# Patient Record
Sex: Female | Born: 1970
Health system: Southern US, Community
[De-identification: ages and names within clinical notes are randomized; demographics above are authoritative.]

## PROBLEM LIST (undated history)

## (undated) DIAGNOSIS — I10 Essential (primary) hypertension: Secondary | ICD-10-CM

## (undated) DIAGNOSIS — K5790 Diverticulosis of intestine, part unspecified, without perforation or abscess without bleeding: Secondary | ICD-10-CM

## (undated) DIAGNOSIS — N200 Calculus of kidney: Secondary | ICD-10-CM

## (undated) DIAGNOSIS — E119 Type 2 diabetes mellitus without complications: Secondary | ICD-10-CM

## (undated) HISTORY — DX: Essential (primary) hypertension: I10

## (undated) HISTORY — DX: Type 2 diabetes mellitus without complications: E11.9

## (undated) HISTORY — PX: ABDOMINAL HYSTERECTOMY: SHX81

---

## 2000-06-06 ENCOUNTER — Encounter: Payer: Self-pay | Admitting: Internal Medicine

## 2000-06-06 ENCOUNTER — Ambulatory Visit (HOSPITAL_COMMUNITY): Admission: RE | Admit: 2000-06-06 | Discharge: 2000-06-06 | Payer: Self-pay | Admitting: Internal Medicine

## 2000-08-17 ENCOUNTER — Emergency Department (HOSPITAL_COMMUNITY): Admission: EM | Admit: 2000-08-17 | Discharge: 2000-08-17 | Payer: Self-pay | Admitting: Emergency Medicine

## 2000-09-28 ENCOUNTER — Other Ambulatory Visit: Admission: RE | Admit: 2000-09-28 | Discharge: 2000-09-28 | Payer: Self-pay | Admitting: Urology

## 2000-10-03 ENCOUNTER — Encounter: Payer: Self-pay | Admitting: Urology

## 2000-10-03 ENCOUNTER — Ambulatory Visit (HOSPITAL_COMMUNITY): Admission: RE | Admit: 2000-10-03 | Discharge: 2000-10-03 | Payer: Self-pay | Admitting: Urology

## 2001-03-28 ENCOUNTER — Encounter: Payer: Self-pay | Admitting: *Deleted

## 2001-03-28 ENCOUNTER — Ambulatory Visit (HOSPITAL_COMMUNITY): Admission: RE | Admit: 2001-03-28 | Discharge: 2001-03-28 | Payer: Self-pay | Admitting: *Deleted

## 2001-07-31 ENCOUNTER — Inpatient Hospital Stay (HOSPITAL_COMMUNITY): Admission: RE | Admit: 2001-07-31 | Discharge: 2001-08-02 | Payer: Self-pay | Admitting: *Deleted

## 2001-11-04 ENCOUNTER — Other Ambulatory Visit: Admission: RE | Admit: 2001-11-04 | Discharge: 2001-11-04 | Payer: Self-pay | Admitting: Dermatology

## 2002-06-05 ENCOUNTER — Emergency Department (HOSPITAL_COMMUNITY): Admission: EM | Admit: 2002-06-05 | Discharge: 2002-06-05 | Payer: Self-pay | Admitting: Emergency Medicine

## 2002-09-24 ENCOUNTER — Encounter: Payer: Self-pay | Admitting: Emergency Medicine

## 2002-09-24 ENCOUNTER — Emergency Department (HOSPITAL_COMMUNITY): Admission: EM | Admit: 2002-09-24 | Discharge: 2002-09-24 | Payer: Self-pay | Admitting: Emergency Medicine

## 2002-09-30 ENCOUNTER — Emergency Department (HOSPITAL_COMMUNITY): Admission: EM | Admit: 2002-09-30 | Discharge: 2002-09-30 | Payer: Self-pay

## 2002-10-23 ENCOUNTER — Encounter (HOSPITAL_COMMUNITY): Admission: RE | Admit: 2002-10-23 | Discharge: 2002-11-22 | Payer: Self-pay | Admitting: Orthopedic Surgery

## 2004-03-13 ENCOUNTER — Emergency Department (HOSPITAL_COMMUNITY): Admission: EM | Admit: 2004-03-13 | Discharge: 2004-03-13 | Payer: Self-pay | Admitting: Emergency Medicine

## 2007-10-18 ENCOUNTER — Ambulatory Visit (HOSPITAL_COMMUNITY): Admission: RE | Admit: 2007-10-18 | Discharge: 2007-10-18 | Payer: Self-pay | Admitting: Internal Medicine

## 2008-10-17 ENCOUNTER — Inpatient Hospital Stay (HOSPITAL_COMMUNITY): Admission: EM | Admit: 2008-10-17 | Discharge: 2008-10-19 | Payer: Self-pay | Admitting: Emergency Medicine

## 2009-02-03 ENCOUNTER — Telehealth: Payer: Self-pay | Admitting: Gastroenterology

## 2009-03-09 ENCOUNTER — Ambulatory Visit: Payer: Self-pay | Admitting: Gastroenterology

## 2009-03-09 DIAGNOSIS — Z8719 Personal history of other diseases of the digestive system: Secondary | ICD-10-CM

## 2009-03-17 ENCOUNTER — Encounter: Payer: Self-pay | Admitting: Gastroenterology

## 2009-03-18 ENCOUNTER — Ambulatory Visit (HOSPITAL_COMMUNITY): Admission: RE | Admit: 2009-03-18 | Discharge: 2009-03-18 | Payer: Self-pay | Admitting: Gastroenterology

## 2009-03-18 ENCOUNTER — Ambulatory Visit: Payer: Self-pay | Admitting: Gastroenterology

## 2010-03-15 NOTE — Assessment & Plan Note (Signed)
Summary: diverticulitis/ss   Visit Type:  Initial Consult Primary Care Provider:  Ouida Sills, M.D.  Chief Complaint:  diverticulitis.  History of Present Illness: 1 episode of diverticulitis and feels well. No change in bowel habits. [id number]  Preventive Screening-Counseling & Management  Alcohol-Tobacco     Smoking Status: never      Drug Use:  no.    Current Medications (verified): 1)  Blood Pressure .... Once Daily  Allergies (verified): No Known Drug Allergies  Past History:  Past Medical History: Hypertension  Past Surgical History: Csxnx2, '94, '03  Family History: No FH of Colon Cancer or polyps  Social History: Occupation: works in [employer] Married, 2 kids, youngest: 7 Patient has never smoked.  Alcohol Use - yes, rare Illicit Drug Use - no Smoking Status:  never Drug Use:  no  Review of Systems  The patient denies anorexia, weight gain, chest pain, abdominal pain, melena, hematochezia, and severe indigestion/heartburn.         June 2010: 194 lbs Per HPI otherwise all systems negative.  No problems with sedation.  Vital Signs:  Patient profile:   40 year old female Height:      62 inches Weight:      215 pounds BMI:     39.47 Temp:     98.3 degrees F oral Pulse rate:   68 / minute BP sitting:   136 / 88  (left arm) Cuff size:   regular  Vitals Entered By: Hendricks Limes LPN (March 09, 2009 4:12 PM)  Physical Exam  General:  Well developed, well nourished, no acute distress. Head:  Normocephalic and atraumatic. Eyes:  PERRLA, no icterus. Mouth:  No deformity or lesions, dentition normal. Neck:  Supple; no masses. Lungs:  Clear throughout to auscultation. Heart:  Regular rate and rhythm; no murmurs, rubs,  or bruits. Abdomen:  Soft, nontender and nondistended. Normal bowel sounds. obese.   Extremities:  No edema or deformities noted. Neurologic:  Alert and  oriented x4;  grossly normal neurologically.  Impression &  Recommendations:  Problem # 1:  DIVERTICULITIS, HX OF (ICD-V12.79) Assessment Improved  TCS next THUR, [place]. Prep instructions given. OPV prn.  CC: PCP  Orders: Consultation Level II (45409)

## 2010-03-15 NOTE — Letter (Signed)
Summary: TCS ORDER  TCS ORDER   Imported By: Ricard Dillon 03/17/2009 12:48:35  _____________________________________________________________________  External Attachment:    Type:   Image     Comment:   External Document

## 2010-05-20 LAB — CBC
HCT: 36 % (ref 36.0–46.0)
MCV: 80.3 fL (ref 78.0–100.0)
Platelets: 310 10*3/uL (ref 150–400)
RDW: 16.3 % — ABNORMAL HIGH (ref 11.5–15.5)
WBC: 16.3 10*3/uL — ABNORMAL HIGH (ref 4.0–10.5)

## 2010-05-20 LAB — URINALYSIS, ROUTINE W REFLEX MICROSCOPIC
Bilirubin Urine: NEGATIVE
Ketones, ur: NEGATIVE mg/dL
Leukocytes, UA: NEGATIVE
Nitrite: NEGATIVE
Protein, ur: NEGATIVE mg/dL
Urobilinogen, UA: 0.2 mg/dL (ref 0.0–1.0)
pH: 7.5 (ref 5.0–8.0)

## 2010-05-20 LAB — BASIC METABOLIC PANEL
BUN: 11 mg/dL (ref 6–23)
Chloride: 104 mEq/L (ref 96–112)
Creatinine, Ser: 0.74 mg/dL (ref 0.4–1.2)
GFR calc non Af Amer: >60 mL/min (ref >60)
Glucose, Bld: 114 mg/dL — ABNORMAL HIGH (ref 70–99)
Potassium: 3.6 mEq/L (ref 3.5–5.1)

## 2010-05-20 LAB — URINE MICROSCOPIC-ADD ON

## 2010-05-20 LAB — GC/CHLAMYDIA PROBE AMP, GENITAL
Chlamydia, DNA Probe: NEGATIVE
GC Probe Amp, Genital: NEGATIVE

## 2010-05-20 LAB — DIFFERENTIAL
Basophils Absolute: 0.1 10*3/uL (ref 0.0–0.1)
Eosinophils Absolute: 0.1 10*3/uL (ref 0.0–0.7)
Eosinophils Relative: 0 % (ref 0–5)
Lymphs Abs: 2.1 10*3/uL (ref 0.7–4.0)
Neutrophils Relative %: 79 % — ABNORMAL HIGH (ref 43–77)

## 2010-05-20 LAB — PREGNANCY, URINE: Preg Test, Ur: NEGATIVE

## 2010-05-20 LAB — WET PREP, GENITAL: WBC, Wet Prep HPF POC: NONE SEEN

## 2010-05-20 LAB — RPR: RPR Ser Ql: NONREACTIVE

## 2010-07-01 NOTE — Discharge Summary (Signed)
[facility]  Patient:    Sonya Kline, Sonya Kline Visit Number: [id number] MRN: [id number]          Service Type: OBS Location: 4A A425 01 Attending Physician:  Jeri Cos. Dictated by:   Langley Gauss, M.D. Admit Date:  07/31/2001 Discharge Date: 08/02/2001                             Discharge Summary  PROCEDURE:  Repeat low transverse cesarean section and intraoperative tuballigation.  DISPOSITION:  Follow up in the office in 3 days time for staple removal. Given a copy of standardized discharge instructions.  DISCHARGE MEDICATIONS: 1. Tylox 2. Hemocyte F.  PERTINENT LABORATORY DATA:  Admission hemoglobin and hematocrit 8.7/25.9, on postoperative day #1 8.1/24.2 with a white count of 15.4, 289,000 platelets and A- blood type.  The patient did receive RhoGAM work-up and RhoGAM following delivery.  HOSPITAL COURSE:  See previous dictations.  On July 31, 2001, repeat low transverse cesarean section, intraoperative tubal ligation performed without complications.  Total estimated blood loss was 1000 cc.  Postoperatively the patient did well.  She tolerated the relative anemia very well.  Vital signs remained stable.  After removal of the Foley catheter she was able to ambulate and void without difficulty.  She tolerated a regular general diet and remained afebrile.  Did well with p.o. narcotics.  Thus patient was discharged to home on August 02, 2001, with copy of standardized discharge instructions.  Infant circumcision performed on August 01, 2001. Dictated by:   Langley Gauss, M.D. Attending Physician:  Jeri Cos. DD:  08/09/01 TD:  08/11/01 Job: [id number]

## 2010-07-01 NOTE — Op Note (Signed)
[facility]  Patient:    Sonya Kline, Sonya Kline Visit Number: [id number] MRN: [id number]          Service Type: OBS Location: 4A A425 01 Attending Physician:  Jeri Cos. Dictated by:   Langley Gauss, M.D. Proc. Date: 07/31/01 Admit Date:  07/31/2001 Discharge Date: 08/02/2001                             Operative Report  PROCEDURE PERFORMED:  Repeat low transverse cesarean section and intraoperative tubal ligation.  Delivery of 7 pound 9.4 ounce female infant.  SURGEON:  Langley Gauss, M.D.  ESTIMATED BLOOD LOSS:  1000 cc.  ANALGESIA:  Placement of spinal.  DRAINS:  Foley catheter to straight drainage as well as JP drain in the subcutaneous space.  SPECIMENS:  Arterial cord gas and arterial blood to pathology laboratory.  The placenta was examined and noted to be intact with a three vessel umbilical cord.  DESCRIPTION OF PROCEDURE: The patient was admitted through the ambulatory unit, taken to the operating room and where spinal analgesia was administered without complications by the anesthesia staff.  The patient was then placed on the operating room table with a slight left lateral tilt.  Foley catheter was sterilely placed to straight drainage with findings of clear yellow urine. The patient was sterilely prepped and draped in the usual manner.  After assurance of adequate surgical analgesia a sharp knife was used to incise the Pfannenstiel incision through the skin, dissected down to the fascia plane utilizing a sharp knife, cauterizing all bleeders along the way.  The fascia was then incised in a transverse curvilinear manner utilizing the Mayo scissors and using straight Kocher clamps, the fascia was dissected off of the underlying rectus muscle in the midline both superiorly and inferiorly utilizing sharp dissection with the Mayo scissors.  The rectus muscles was then bluntly separated.  The peritoneal cavity was atraumatically  bluntly entered at the superior most portion of the incision.  The peritoneal incision was then extended superiorly and inferiorly and inferiorly with direct visualization of the bladder to avoid accidental injury. The bladder blade was then placed.  The bladder flap was then created from the vesicouterine fold in the avascular plane.  The sharp knife was then used to score a low transverse uterine incision.  Intact amniotic sac was encountered in the midline.  The index fingers were then used to bluntly extend the low transverse uterine incision.  Amniotic sac was artificially ruptured utilizing Allis clamp. Clear amniotic fluid was noted.  With my right hand into the uterine cavity, the head of the infant was noted to be in a LOT position.  The head of the infant was then flexed with fundal pressure from above and gentle traction. The head delivered atraumatically through the uterine incision without difficulty.  Mouth and nares were bulb suctioned of clear amniotic fluid. Gentle traction combined with fundal massage resulted in delivery of the remainder of the infant without complications.  The umbilical cord was then milked toward the infant.  Spontaneous and vigorous breathing cry is noted. Transection of the umbilical cord is performed.  The infant was handed off to the awaiting pediatrician.  Arterial cord gas and cord blood were then obtained from the placenta.  Gentle traction on the umbilical cord resulted in separation which appears to be intact placenta with attached cord.  The uterus was then exteriorized.  The uterine incision was then closed  with two layers of 0 chromic suture in a running locked fashion, the second layer being an imbricating layer. This resulted in excellent hemostasis along the entirety of the incision.  Tubes and ovaries were noted to be normal in appearance at this time.  Each of the tubes were then grasped in its midportion and modified Pomeroy  intraoperative tubal ligation performed as follows.  After grasping each of the tubes and elevating them a loop of right and left fallopian tube was performed.  Two ties of #1 plain suture were then placed at the base of the loop of tube performed.  Transection of the fallopian tube allows me to hand off specimen, a small portion of the right and left fallopian tubes. After transection, hemostasis was assured from out tubal ligation site. Cul-de-sac was then irrigated free of all clots.  The uterus was returned to the pelvic cavity.  The peritoneal edges were grasped using Kelly clamps. Copious irrigation was performed at the pelvic cavity to assure hemostasis. The peritoneum and overlying rectus muscles were then closed utilizing 0 chromic in a running fashion.  No subfascial bleeders were identified.  The fascia was then closed with a continuous running #1 loop Novofil suture. Subcutaneous bleeders were then cauterized.  A separate JP drain was then placed through with an exit site to the right apex of the incision.  This was sutured in place.  Three interrupted #1 Maxon sutures were then placed through the skin edges to facilitate closure functioning as retention type sutures. The skin was then completely closed utilizing skin staples and 50 cc of 0.5% of bupivacaine plain was placed subcutaneously through a small skin wheal.  At the completion of the procedure, the patients vital signs remained stable. She continues to drain clear yellow urine.  She is thus taken to the recovery room in stable condition. Dictated by:   Langley Gauss, M.D. Attending Physician:  Jeri Cos. DD:  08/09/01 TD:  08/11/01 Job: [id number]

## 2011-04-04 ENCOUNTER — Ambulatory Visit (HOSPITAL_COMMUNITY)
Admission: RE | Admit: 2011-04-04 | Discharge: 2011-04-04 | Disposition: A | Discharge status: Home / Self Care | Payer: BC Managed Care – PPO | Source: Ambulatory Visit | Attending: Internal Medicine | Admitting: Internal Medicine

## 2011-04-04 ENCOUNTER — Other Ambulatory Visit (HOSPITAL_COMMUNITY): Payer: Self-pay | Admitting: Internal Medicine

## 2011-04-04 DIAGNOSIS — M25579 Pain in unspecified ankle and joints of unspecified foot: Secondary | ICD-10-CM | POA: Insufficient documentation

## 2011-04-04 DIAGNOSIS — R52 Pain, unspecified: Secondary | ICD-10-CM

## 2011-05-03 ENCOUNTER — Encounter: Payer: Self-pay | Admitting: Orthopedic Surgery

## 2011-05-03 ENCOUNTER — Ambulatory Visit (INDEPENDENT_AMBULATORY_CARE_PROVIDER_SITE_OTHER): Payer: BC Managed Care – PPO | Admitting: Orthopedic Surgery

## 2011-05-03 VITALS — BP 132/90 | Ht 62.0 in | Wt 210.0 lb

## 2011-05-03 DIAGNOSIS — M25579 Pain in unspecified ankle and joints of unspecified foot: Secondary | ICD-10-CM

## 2011-05-03 DIAGNOSIS — M25572 Pain in left ankle and joints of left foot: Secondary | ICD-10-CM | POA: Insufficient documentation

## 2011-05-03 NOTE — Progress Notes (Signed)
  Subjective:    Sonya Kline is a 41 y.o. female who presents with atraumatic onset of medial ankle pain which is 7/10 no trauma no numbness no tingling no pain with standing but pain at rest.  Review of systems normal  The following portions of the patient's history were reviewed and updated as appropriate: allergies, current medications, past family history, past medical history, past social history, past surgical history and problem list.    Objective:    BP 132/90  Ht 5\' 2"  (1.575 m)  Wt 210 lb (95.255 kg)  BMI 38.41 kg/m2  Vital signs are stable as recorded  General appearance is normal  The patient is alert and oriented x3  The patient's mood and affect are normal  Gait assessment:  Normal  The cardiovascular exam reveals normal pulses and temperature without edema swelling.  The lymphatic system is negative for palpable lymph nodes  The sensory exam is normal.  There are no pathologic reflexes.  Balance is normal.  Skin normal   Right ankle:   NA  Left ankle:   normal except tenderness over the medial malleolus  The images show just a cortical abnormality in the medial malleolus otherwise ankle mortise intact own quality and tag no sign of soft tissue mass.     Imaging: X-ray of as above done at [facility]  ankle(s): no fracture, dislocation, swelling or degenerative changes noted    Assessment:    ankle pain     Plan:    bone scan

## 2011-05-03 NOTE — Patient Instructions (Signed)
Office will call to schedule appointment

## 2011-05-10 ENCOUNTER — Encounter (HOSPITAL_COMMUNITY): Payer: BC Managed Care – PPO

## 2011-05-16 ENCOUNTER — Encounter (HOSPITAL_COMMUNITY)
Admission: RE | Admit: 2011-05-16 | Discharge: 2011-05-16 | Disposition: A | Discharge status: Home / Self Care | Payer: BC Managed Care – PPO | Source: Ambulatory Visit | Attending: Orthopedic Surgery | Admitting: Orthopedic Surgery

## 2011-05-16 DIAGNOSIS — R937 Abnormal findings on diagnostic imaging of other parts of musculoskeletal system: Secondary | ICD-10-CM | POA: Insufficient documentation

## 2011-05-16 DIAGNOSIS — M25579 Pain in unspecified ankle and joints of unspecified foot: Secondary | ICD-10-CM | POA: Insufficient documentation

## 2011-05-16 DIAGNOSIS — M25572 Pain in left ankle and joints of left foot: Secondary | ICD-10-CM

## 2011-05-16 MED ORDER — TECHNETIUM TC 99M MEDRONATE IV KIT
25.0000 | PACK | Freq: Once | INTRAVENOUS | Status: AC | PRN
  Administered 2011-05-16: 25 via INTRAVENOUS

## 2011-05-22 ENCOUNTER — Ambulatory Visit: Payer: BC Managed Care – PPO | Admitting: Orthopedic Surgery

## 2011-05-31 ENCOUNTER — Ambulatory Visit: Payer: BC Managed Care – PPO | Admitting: Orthopedic Surgery

## 2011-05-31 ENCOUNTER — Telehealth: Payer: Self-pay | Admitting: Orthopedic Surgery

## 2011-05-31 NOTE — Telephone Encounter (Signed)
Patient has called about her scheduled appointment for today, 05/31/11, for bone scan results, and needs to cancel, due to son being ill; states needs to take him to  a doctor; she is asking if she can receive results by phone?  This is her 2nd time of needing to cancel appointment.  Her phone # is [phone] (home #, rings to her cell phone.)

## 2011-11-21 ENCOUNTER — Encounter (HOSPITAL_COMMUNITY): Payer: Self-pay | Admitting: *Deleted

## 2011-11-21 ENCOUNTER — Emergency Department (HOSPITAL_COMMUNITY)
Admission: EM | Admit: 2011-11-21 | Discharge: 2011-11-21 | Disposition: A | Discharge status: Home / Self Care | Payer: Self-pay | Attending: Emergency Medicine | Admitting: Emergency Medicine

## 2011-11-21 ENCOUNTER — Emergency Department (HOSPITAL_COMMUNITY): Payer: Self-pay

## 2011-11-21 DIAGNOSIS — J189 Pneumonia, unspecified organism: Secondary | ICD-10-CM | POA: Insufficient documentation

## 2011-11-21 MED ORDER — CEFTRIAXONE SODIUM 1 G IJ SOLR
1.0000 g | Freq: Once | INTRAMUSCULAR | Status: AC
  Administered 2011-11-21: 1 g via INTRAMUSCULAR
  Filled 2011-11-21: qty 10

## 2011-11-21 MED ORDER — IBUPROFEN 400 MG PO TABS
600.0000 mg | ORAL_TABLET | Freq: Once | ORAL | Status: AC
  Administered 2011-11-21: 600 mg via ORAL
  Filled 2011-11-21: qty 2

## 2011-11-21 MED ORDER — AZITHROMYCIN 250 MG PO TABS
500.0000 mg | ORAL_TABLET | Freq: Once | ORAL | Status: AC
  Administered 2011-11-21: 500 mg via ORAL
  Filled 2011-11-21: qty 2

## 2011-11-21 MED ORDER — LIDOCAINE HCL (PF) 1 % IJ SOLN
INTRAMUSCULAR | Status: AC
  Filled 2011-11-21: qty 5

## 2011-11-21 MED ORDER — DEXTROSE 5 % IV SOLN: 1.0000 g | Freq: Once | INTRAVENOUS | Status: DC

## 2011-11-21 MED ORDER — AZITHROMYCIN 250 MG PO TABS: 250.0000 mg | ORAL_TABLET | Freq: Every day | ORAL | Status: DC

## 2011-11-21 MED ORDER — LEVOFLOXACIN 500 MG PO TABS: 500.0000 mg | ORAL_TABLET | Freq: Every day | ORAL | Status: DC

## 2011-11-21 MED ORDER — ACETAMINOPHEN 500 MG PO TABS
ORAL_TABLET | ORAL | Status: AC
  Administered 2011-11-21: 1000 mg
  Filled 2011-11-21: qty 2

## 2011-11-21 MED ORDER — SODIUM CHLORIDE 0.9 % IV BOLUS (SEPSIS): 1000.0000 mL | Freq: Once | INTRAVENOUS | Status: DC

## 2011-11-21 MED ORDER — DEXTROSE 5 % IV SOLN: 500.0000 mg | INTRAVENOUS | Status: DC

## 2011-11-21 NOTE — ED Provider Notes (Signed)
History  This chart was scribed for Raeford Razor, MD by Bennett Scrape. This patient was seen in room APA01/APA01 and the patient's care was started at 8:33PM.  CSN: [id number]  Arrival date & time 11/21/11  1939   First MD Initiated Contact with Patient 11/21/11 2033      Chief Complaint  Patient presents with  . Cough     The history is provided by the patient. No language interpreter was used.   Sonya Kline is a 41 y.o. female who presents to the Emergency Department complaining of 24 hours of gradual onset, gradually worsening, constant non-productive cough with associated myalgias and fever. Fever was measured 101.2 in the ED. She reports CP when coughing only. She denies having any sick contacts with similar symptoms. She denies chills, nausea, emesis, HA and rash as associated symptoms. She has a h/o HTN. She denies smoking and alcohol use.  Past Medical History  Diagnosis Date  . HTN (hypertension)     Past Surgical History  Procedure Date  . Cesarean section     x 2    History reviewed. No pertinent family history.  History  Substance Use Topics  . Smoking status: Never Smoker   . Smokeless tobacco: Not on file  . Alcohol Use: No    No OB history provided.  Review of Systems  Constitutional: Positive for fever. Negative for chills.  Respiratory: Positive for cough. Negative for shortness of breath.   Cardiovascular: Positive for chest pain (with coughing). Negative for leg swelling.  Gastrointestinal: Negative for nausea and vomiting.  Musculoskeletal: Positive for myalgias.  All other systems reviewed and are negative.    Allergies  Contrast media  Home Medications   Current Outpatient Rx  Name Route Sig Dispense Refill  . AMLODIPINE BESYLATE 5 MG PO TABS Oral Take 5 mg by mouth daily.      Triage Vitals: BP 157/103  Pulse 110  Temp 101.2 F (38.4 C) (Oral)  Resp 20  Ht 5\' 2"  (1.575 m)  Wt 205 lb (92.987 kg)  BMI 37.49 kg/m2  SpO2 99%   LMP 11/14/2011  Physical Exam  Nursing note and vitals reviewed. Constitutional: She is oriented to person, place, and time. She appears well-developed and well-nourished. No distress.  HENT:  Head: Normocephalic and atraumatic.  Eyes: Conjunctivae normal and EOM are normal. Pupils are equal, round, and reactive to light.  Neck: Normal range of motion. Neck supple.  Cardiovascular: Regular rhythm and normal heart sounds.   No murmur heard.      mildly tachycardic  Pulmonary/Chest: Effort normal and breath sounds normal. No respiratory distress. She has no wheezes. She has no rales.  Abdominal: Soft. Bowel sounds are normal.  Musculoskeletal: Normal range of motion.       No calf tenderness, no lower extremity edema  Neurological: She is alert and oriented to person, place, and time.  Skin: Skin is warm and dry.  Psychiatric: She has a normal mood and affect.    ED Course  Procedures (including critical care time)  DIAGNOSTIC STUDIES: Oxygen Saturation is 99% on room air, normal by my interpretation.    COORDINATION OF CARE: 8:45PM-Ordered 600 mg ibuprofen.   8:47PM-Discussed treatment plan which includes a CXR with pt at bedside and pt agreed to plan.  9:38PM-Pt rechecked and is resting comfortably. Informed pt of PNA found on radiology report. Discussed discharge plan of one dose of antibiotics in the ED and oral antibiotics for PNA with pt at  bedside and pt agreed to plan. Pt decided for antibiotics shot before discharge. Advised pt to return if she develops trouble breathing or other concerning symptoms.  9:45PM-Ordered 1 g Rocephin injection and 500 mg Zithromax.  Labs Reviewed - No data to display Dg Chest 2 View  11/21/2011  *RADIOLOGY REPORT*  Clinical Data: Cp cough; sob; body aches; fever all started last nigh  CHEST - 2 VIEW  Comparison: 10/17/1008  Findings: Asymmetric right upper lobe perihilar thickening and linear opacities may reflect early bronchopneumonia.   Lungs appear otherwise clear. Cardiac and mediastinal contours appear unremarkable.  No pleural effusion identified.  IMPRESSION:  1.  Streaky perihilar right upper lobe opacities with airway thickening, suspicious for early bronchopneumonia.   Original Report Authenticated By: Dellia Cloud, M.D.      1. CAP (community acquired pneumonia)       MDM  41yF with cough and fever. CXR consistent with PNA. NO respiratory distress on exam. No serious comorbidities. Feel that can be tx'd as outpt at this time. Return precautions discussed.  I personally preformed the services scribed in my presence. The recorded information has been reviewed and considered. Raeford Razor, MD.       Raeford Razor, MD 12/01/11 [phone]

## 2011-11-21 NOTE — ED Notes (Signed)
Cough since last night, body aches

## 2012-12-19 ENCOUNTER — Encounter (HOSPITAL_COMMUNITY): Payer: Self-pay | Admitting: Emergency Medicine

## 2012-12-19 ENCOUNTER — Emergency Department (HOSPITAL_COMMUNITY)
Admission: EM | Admit: 2012-12-19 | Discharge: 2012-12-19 | Disposition: A | Discharge status: Home / Self Care | Payer: BC Managed Care – PPO | Attending: Emergency Medicine | Admitting: Emergency Medicine

## 2012-12-19 DIAGNOSIS — I10 Essential (primary) hypertension: Secondary | ICD-10-CM | POA: Insufficient documentation

## 2012-12-19 DIAGNOSIS — H6690 Otitis media, unspecified, unspecified ear: Secondary | ICD-10-CM | POA: Insufficient documentation

## 2012-12-19 DIAGNOSIS — R000 Tachycardia, unspecified: Secondary | ICD-10-CM | POA: Insufficient documentation

## 2012-12-19 DIAGNOSIS — J039 Acute tonsillitis, unspecified: Secondary | ICD-10-CM | POA: Insufficient documentation

## 2012-12-19 DIAGNOSIS — Z79899 Other long term (current) drug therapy: Secondary | ICD-10-CM | POA: Insufficient documentation

## 2012-12-19 DIAGNOSIS — H6691 Otitis media, unspecified, right ear: Secondary | ICD-10-CM

## 2012-12-19 DIAGNOSIS — Z792 Long term (current) use of antibiotics: Secondary | ICD-10-CM | POA: Insufficient documentation

## 2012-12-19 MED ORDER — AMOXICILLIN 500 MG PO CAPS: 500.0000 mg | ORAL_CAPSULE | Freq: Three times a day (TID) | ORAL | Status: DC

## 2012-12-19 NOTE — ED Provider Notes (Signed)
CSN: [id number]     Arrival date & time 12/19/12  2019 History   First MD Initiated Contact with Patient 12/19/12 2053     Chief Complaint  Patient presents with  . Sore Throat   (Consider location/radiation/quality/duration/timing/severity/associated sxs/prior Treatment) Patient is a 42 y.o. female presenting with pharyngitis. The history is provided by the patient.  Sore Throat This is a new problem. The current episode started in the past 7 days. The problem occurs constantly. The problem has been gradually worsening. Associated symptoms include a sore throat and swollen glands. Pertinent negatives include no abdominal pain, chills, coughing, fever, headaches, nausea, neck pain, rash or vomiting. The symptoms are aggravated by eating and swallowing. She has tried acetaminophen for the symptoms. The treatment provided no relief.   Sonya Kline is a 42 y.o. female who presents to the ED with sore throat and right ear pain. The symptoms started a few days ago.  Past Medical History  Diagnosis Date  . HTN (hypertension)    Past Surgical History  Procedure Laterality Date  . Cesarean section      x 2  . Abdominal hysterectomy     History reviewed. No pertinent family history. History  Substance Use Topics  . Smoking status: Never Smoker   . Smokeless tobacco: Not on file  . Alcohol Use: No   OB History   Grav Para Term Preterm Abortions TAB SAB Ect Mult Living                 Review of Systems  Constitutional: Negative for fever and chills.  HENT: Positive for ear pain and sore throat.   Eyes: Negative for visual disturbance.  Respiratory: Negative for cough and shortness of breath.   Gastrointestinal: Negative for nausea, vomiting and abdominal pain.  Genitourinary: Negative for dysuria and frequency.  Musculoskeletal: Negative for neck pain.  Skin: Negative for rash.  Allergic/Immunologic: Negative for immunocompromised state.  Neurological: Negative for dizziness and  headaches.  Psychiatric/Behavioral: The patient is not nervous/anxious.     Allergies  Contrast media  Home Medications   Current Outpatient Rx  Name  Route  Sig  Dispense  Refill  . amLODipine (NORVASC) 5 MG tablet   Oral   Take 5 mg by mouth every evening.          [name] levofloxacin (LEVAQUIN) 500 MG tablet   Oral   Take 1 tablet (500 mg total) by mouth daily.   7 tablet   0   . Phenyleph-CPM-DM-Aspirin (ALKA-SELTZER PLUS COLD & COUGH) 7.09-14-08-325 MG TBEF   Oral   Take 1 packet by mouth at bedtime as needed. For cough and symptoms          BP 167/103  Pulse 102  Temp(Src) 98.6 F (37 C) (Oral)  Resp 20  Ht 5\' 2"  (1.575 m)  Wt 215 lb (97.523 kg)  BMI 39.31 kg/m2  SpO2 98%  LMP 11/14/2011 Physical Exam  Nursing note and vitals reviewed. Constitutional: She is oriented to person, place, and time. She appears well-developed and well-nourished. No distress.  HENT:  Head: Normocephalic and atraumatic.  Right Ear: Tympanic membrane is erythematous.  Left Ear: Tympanic membrane normal.  Nose: Rhinorrhea present.  Mouth/Throat: Uvula is midline and mucous membranes are normal. Posterior oropharyngeal erythema present.  Eyes: EOM are normal.  Neck: Neck supple.  Cardiovascular: Tachycardia present.   Pulmonary/Chest: Effort normal.  Abdominal: Soft. There is no tenderness.  Musculoskeletal: Normal range of motion.  Neurological: She  is alert and oriented to person, place, and time. No cranial nerve deficit.  Skin: Skin is warm and dry.  Psychiatric: She has a normal mood and affect. Her behavior is normal.    ED Course  Procedures  MDM  42 y.o. female with otitis media right and pharyngitis. Will treat with antibiotics and she is to follow up with ENT.  Discussed with the patient and all questioned fully answered. She will return if any problems arise.    Medication List    TAKE these medications       amoxicillin 500 MG capsule  Commonly known as:   AMOXIL  Take 1 capsule (500 mg total) by mouth 3 (three) times daily.      ASK your doctor about these medications       ALKA-SELTZER PLUS COLD & COUGH 7.09-14-08-325 MG Tbef  Generic drug:  Phenyleph-CPM-DM-Aspirin  Take 1 packet by mouth at bedtime as needed. For cough and symptoms     amLODipine 5 MG tablet  Commonly known as:  NORVASC  Take 5 mg by mouth every evening.     levofloxacin 500 MG tablet  Commonly known as:  LEVAQUIN  Take 1 tablet (500 mg total) by mouth daily.           Janne Napoleon, [place] 12/21/12 [phone]

## 2012-12-19 NOTE — ED Notes (Signed)
Sore throat, rt ear pain.

## 2012-12-19 NOTE — Discharge Instructions (Signed)
Follow up with Dr. Ouida Sills to be sure your ear infection clears.  Return here as needed.

## 2012-12-21 NOTE — ED Provider Notes (Signed)
Medical screening examination/treatment/procedure(s) were performed by non-physician practitioner and as supervising physician I was immediately available for consultation/collaboration.  EKG Interpretation   None        Geoffery Lyons, MD 12/21/12 2344

## 2013-03-17 ENCOUNTER — Encounter (HOSPITAL_COMMUNITY): Payer: Self-pay | Admitting: Emergency Medicine

## 2013-03-17 ENCOUNTER — Emergency Department (HOSPITAL_COMMUNITY)
Admission: EM | Admit: 2013-03-17 | Discharge: 2013-03-17 | Disposition: A | Discharge status: Home / Self Care | Payer: BC Managed Care – PPO | Attending: Emergency Medicine | Admitting: Emergency Medicine

## 2013-03-17 DIAGNOSIS — S91009A Unspecified open wound, unspecified ankle, initial encounter: Principal | ICD-10-CM

## 2013-03-17 DIAGNOSIS — S81009A Unspecified open wound, unspecified knee, initial encounter: Secondary | ICD-10-CM | POA: Insufficient documentation

## 2013-03-17 DIAGNOSIS — Y939 Activity, unspecified: Secondary | ICD-10-CM | POA: Insufficient documentation

## 2013-03-17 DIAGNOSIS — Z79899 Other long term (current) drug therapy: Secondary | ICD-10-CM | POA: Insufficient documentation

## 2013-03-17 DIAGNOSIS — Z792 Long term (current) use of antibiotics: Secondary | ICD-10-CM | POA: Insufficient documentation

## 2013-03-17 DIAGNOSIS — I10 Essential (primary) hypertension: Secondary | ICD-10-CM | POA: Insufficient documentation

## 2013-03-17 DIAGNOSIS — Y929 Unspecified place or not applicable: Secondary | ICD-10-CM | POA: Insufficient documentation

## 2013-03-17 DIAGNOSIS — W540XXA Bitten by dog, initial encounter: Secondary | ICD-10-CM | POA: Insufficient documentation

## 2013-03-17 DIAGNOSIS — Z23 Encounter for immunization: Secondary | ICD-10-CM | POA: Insufficient documentation

## 2013-03-17 DIAGNOSIS — S81809A Unspecified open wound, unspecified lower leg, initial encounter: Principal | ICD-10-CM

## 2013-03-17 MED ORDER — AMOXICILLIN-POT CLAVULANATE 875-125 MG PO TABS: 1.0000 | ORAL_TABLET | Freq: Two times a day (BID) | ORAL | Status: DC

## 2013-03-17 MED ORDER — RABIES IMMUNE GLOBULIN 150 UNIT/ML IM INJ
20.0000 [IU]/kg | INJECTION | Freq: Once | INTRAMUSCULAR | Status: AC
  Administered 2013-03-17: 1950 [IU] via INTRAMUSCULAR
  Filled 2013-03-17: qty 2

## 2013-03-17 MED ORDER — TETANUS-DIPHTH-ACELL PERTUSSIS 5-2.5-18.5 LF-MCG/0.5 IM SUSP
0.5000 mL | Freq: Once | INTRAMUSCULAR | Status: AC
  Administered 2013-03-17: 0.5 mL via INTRAMUSCULAR
  Filled 2013-03-17: qty 0.5

## 2013-03-17 MED ORDER — RABIES VACCINE, PCEC IM SUSR
1.0000 mL | Freq: Once | INTRAMUSCULAR | Status: AC
  Administered 2013-03-17: 1 mL via INTRAMUSCULAR

## 2013-03-17 NOTE — ED Notes (Signed)
Short stay notified of pt .  Will call tomorrow.  No untoward reaction to meds.  Alert, NAD  Dog bite was cleaned

## 2013-03-17 NOTE — ED Provider Notes (Signed)
CSN: [id number]     Arrival date & time 03/17/13  1412 History  This chart was scribed for non-physician practitioner Elisha Headland, NP working with Ezequiel Essex, MD by Anastasia Pall, ED scribe. This patient was seen in room APFT21/APFT21 and the patient's care was started at 2:32 PM.   Chief Complaint  Patient presents with  . Animal Bite    The history is provided by the patient. No language interpreter was used.   HPI Comments: Sonya Kline is a 43 y.o. female who presents to the Emergency Department complaining of a superficial wound from a dog bite over the back of her right leg, onset earlier this afternoon. Bleeding is controlled. She reports that the dog, [place], is in custody of animal control and dog's vaccination information is pending. She denies knowing if her tetanus vaccination is UTD. She denies any other symptoms. She denies any medical history.   PCP Asencion Noble, MD  Past Medical History  Diagnosis Date  . HTN (hypertension)    Past Surgical History  Procedure Laterality Date  . Cesarean section      x 2  . Abdominal hysterectomy     History reviewed. No pertinent family history. History  Substance Use Topics  . Smoking status: Never Smoker   . Smokeless tobacco: Never Used  . Alcohol Use: No   OB History   Grav Para Term Preterm Abortions TAB SAB Ect Mult Living   4 2 2  2  2   2      Review of Systems  Musculoskeletal: Negative for arthralgias and myalgias.  Skin: Positive for wound (dog bite on RLE).  All other systems reviewed and are negative.   Allergies  Contrast media  Home Medications   Current Outpatient Rx  Name  Route  Sig  Dispense  Refill  . amLODipine (NORVASC) 5 MG tablet   Oral   Take 5 mg by mouth every evening.          [name] amoxicillin (AMOXIL) 500 MG capsule   Oral   Take 1 capsule (500 mg total) by mouth 3 (three) times daily.   30 capsule   0   . levofloxacin (LEVAQUIN) 500 MG tablet   Oral   Take 1 tablet  (500 mg total) by mouth daily.   7 tablet   0   . Phenyleph-CPM-DM-Aspirin (ALKA-SELTZER PLUS COLD & COUGH) 7.09-14-08-325 MG TBEF   Oral   Take 1 packet by mouth at bedtime as needed. For cough and symptoms          BP 153/99  Pulse 118  Temp(Src) 98.2 F (36.8 C) (Oral)  Resp 18  Ht 5\' 2"  (1.575 m)  Wt 216 lb (97.977 kg)  BMI 39.50 kg/m2  SpO2 96%  LMP 11/14/2011  Physical Exam  Nursing note and vitals reviewed. Constitutional: She is oriented to person, place, and time. She appears well-developed and well-nourished. No distress.  HENT:  Head: Normocephalic and atraumatic.  Eyes: EOM are normal.  Neck: Neck supple.  Cardiovascular: Normal rate, regular rhythm and normal heart sounds.   No murmur heard. Pulmonary/Chest: Effort normal and breath sounds normal. No respiratory distress. She has no wheezes. She has no rales.  Musculoskeletal: Normal range of motion.  Neurological: She is alert and oriented to person, place, and time.  Skin: Skin is warm and dry.  1 isolated single puncture wound from dog bite over posterior thigh.  Psychiatric: She has a normal mood and affect. Her  behavior is normal.   ED Course  Procedures (including critical care time)  DIAGNOSTIC STUDIES: Oxygen Saturation is 96% on room air, normal by my interpretation.    COORDINATION OF CARE: 2:34 PM-Discussed treatment plan which includes tetanus vaccination with pt at bedside and pt agreed to plan.   Labs Review Labs Reviewed - No data to display Imaging Review No results found.  EKG Interpretation   None      Medications - No data to display  4:35 PM Animal was procured by animal control and will confine for 24 hours and observe animal's behavior. Dog's vaccination status is not up to date. Discussed with pt and she agreed to proceed with rabies vaccine and immunoglobulin.  Return information regarding pending immunizations given to pt on discharge.   MDM   1. Dog bite     Single puncture wound on posterior right thigh. No drainage, erythema or edema at site. Dog to be contained for observation by animal control. Return instructions for additional rabies injections given. Monitor for s/s of infections and discussed with pt.   I personally performed the services described in this documentation, which was scribed in my presence. The recorded information has been reviewed and is accurate.    Elisha Headland, NP 03/17/13 [phone]

## 2013-03-17 NOTE — ED Provider Notes (Signed)
Medical screening examination/treatment/procedure(s) were performed by non-physician practitioner and as supervising physician I was immediately available for consultation/collaboration.  EKG Interpretation   None         Ezequiel Essex, MD 03/17/13 1729

## 2013-03-17 NOTE — ED Notes (Signed)
Animal control says that dog is not current with rabies immunization. The dog is under quarantine for 10 day.  They say that the dog appears healthy.

## 2013-03-17 NOTE — Discharge Instructions (Signed)
Animal Bite °An animal bite can result in a scratch on the skin, deep open cut, puncture of the skin, crush injury, or tearing away of the skin or a body part. Dogs are responsible for most animal bites. Children are bitten more often than adults. An animal bite can range from very mild to more serious. A small bite from your house pet is no cause for alarm. However, some animal bites can become infected or injure a bone or other tissue. You must seek medical care if: °· The skin is broken and bleeding does not slow down or stop after 15 minutes. °· The puncture is deep and difficult to clean (such as a cat bite). °· Pain, warmth, redness, or pus develops around the wound. °· The bite is from a stray animal or rodent. There may be a risk of rabies infection. °· The bite is from a snake, raccoon, skunk, fox, coyote, or bat. There may be a risk of rabies infection. °· The person bitten has a chronic illness such as diabetes, liver disease, or cancer, or the person takes medicine that lowers the immune system. °· There is concern about the location and severity of the bite. °It is important to clean and protect an animal bite wound right away to prevent infection. Follow these steps: °· Clean the wound with plenty of water and soap. °· Apply an antibiotic cream. °· Apply gentle pressure over the wound with a clean towel or gauze to slow or stop bleeding. °· Elevate the affected area above the heart to help stop any bleeding. °· Seek medical care. Getting medical care within 8 hours of the animal bite leads to the best possible outcome. °DIAGNOSIS  °Your caregiver will most likely: °· Take a detailed history of the animal and the bite injury. °· Perform a wound exam. °· Take your medical history. °Blood tests or X-rays may be performed. Sometimes, infected bite wounds are cultured and sent to a lab to identify the infectious bacteria.  °TREATMENT  °Medical treatment will depend on the location and type of animal bite as  well as the patient's medical history. Treatment may include: °· Wound care, such as cleaning and flushing the wound with saline solution, bandaging, and elevating the affected area. °· Antibiotics. °· Tetanus immunization. °· Rabies immunization. °· Leaving the wound open to heal. This is often done with animal bites, due to the high risk of infection. However, in certain cases, wound closure with stitches, wound adhesive, skin adhesive strips, or staples may be used. ° Infected bites that are left untreated may require intravenous (IV) antibiotics and surgical treatment in the hospital. °HOME CARE INSTRUCTIONS °· Follow your caregiver's instructions for wound care. °· Take all medicines as directed. °· If your caregiver prescribes antibiotics, take them as directed. Finish them even if you start to feel better. °· Follow up with your caregiver for further exams or immunizations as directed. °You may need a tetanus shot if: °· You cannot remember when you had your last tetanus shot. °· You have never had a tetanus shot. °· The injury broke your skin. °If you get a tetanus shot, your arm may swell, get red, and feel warm to the touch. This is common and not a problem. If you need a tetanus shot and you choose not to have one, there is a rare chance of getting tetanus. Sickness from tetanus can be serious. °SEEK MEDICAL CARE IF: °· You notice warmth, redness, soreness, swelling, pus discharge, or a bad   smell coming from the wound.  You have a red line on the skin coming from the wound.  You have a fever, chills, or a general ill feeling.  You have nausea or vomiting.  You have continued or worsening pain.  You have trouble moving the injured part.  You have other questions or concerns. MAKE SURE YOU:  Understand these instructions.  Will watch your condition.  Will get help right away if you are not doing well or get worse. Document Released: 10/18/2010 Document Revised: 04/24/2011 Document  Reviewed: 10/18/2010 [facility] Patient Information 2014 [place]. Rabies  Rabies is a viral infection that can be spread to people from infected animals. The infection affects the brain and central nervous system. Once the disease develops, it almost always causes death. Because of this, when a person is bitten by an animal that may have rabies, treatment to prevent rabies often needs to be started whether or not the animal is known to be infected. Prompt treatment with the rabies vaccine and rabies immune globulin is very effective at preventing the infection from developing in people who have been exposed to the rabies virus. CAUSES  Rabies is caused by a virus that lives inside some animals. When a person is bitten by an infected animal, the rabies virus is spread to the person through the infected spit (saliva) of the animal. This virus can be carried by animals such as dogs, cats, skunks, bats, woodchucks, raccoons, coyotes, and foxes. SYMPTOMS  By the time symptoms appear, rabies is usually fatal for the person. Common symptoms include:  Headache.  Fever.  Fatigue and weakness.  Agitation.  Anxiety.  Confusion.  Unusual behavior, such as hyperactivity, fear of water (hydrophobia), or fear of air (aerophobia).  Hallucinations.  Insomnia.  Weakness in the arms or legs.  Difficulty swallowing. Most people get sick in 1 3 months after being bitten. This often varies and may depend on the location of the bite. The infection will take less time to develop if the bite occurred closer to the head.  DIAGNOSIS  To determine if a person is infected, several tests must be performed, such as:  A skin biopsy.  A saliva test.  A lumbar puncture to remove spinal fluid so it can be examined.  Blood tests. TREATMENT  Treatment to prevent the infection from developing (post-exposure prophylaxis, PEP) is often started before knowing for sure if the person has been exposed to the  rabies virus. PEP involves cleaning the wound, giving an antibody injection (rabies immune globulin), and giving a series of rabies vaccine injections. The series of injections are usually given over a two-week period. If possible, the animal that bit the person will be observed to see if it remains healthy. If the animal has been killed, it can be sent to a state laboratory and examined to see if the animal had rabies. If a person is bitten by a domestic animal (dog, cat, or ferret) that appears healthy and can be observed to see if it remains healthy, often no further treatment is necessary other than care of the wounds caused by the animal. Rabies is often a fatal illness once the infection develops in a person. Although a few people who developed rabies have survived after experimental treatment with certain drugs, all these survivors still had severe nervous system problems after the treatment. This is why caregivers use extra caution and begin PEP treatment for people who have been bitten by animals that are possibly infected with  rabies.  HOME CARE INSTRUCTIONS  If you were bitten by an unknown animal, make sure you know your caregiver's instructions for follow-up. If the animal was sent to a laboratory for examination, ask when the test results will be ready. Make sure you get the test results.  Take these steps to care for your wound:  Keep the wound clean, dry, and dressed as directed by your caregiver.  Keep the injured part elevated as much as possible.  Do not resume use of the affected area until directed.  Only take over-the-counter or prescription medicines as directed by your caregiver.  Keep all follow-up appointments as directed by your caregiver. PREVENTION  To prevent rabies, people need to reduce their risk of having contact with infected animals.   Make sure your pets (dogs, cats, ferrets) are vaccinated against rabies. Keep these vaccinations up-to-date as directed by your  veterinarian.  Supervise your pets when they are outside. Keep them away from wild animals.  Call your local animal control services to report any stray animals. These animals may not be vaccinated.  Stay away from stray or wild animals.  Consider getting the rabies vaccine (preexposure) if you are traveling to an area where rabies is common or if your job or activities involve possible contact with wild or stray animals. Discuss this with your caregiver. Document Released: 01/30/2005 Document Revised: 10/25/2011 Document Reviewed: 08/29/2011 [facility] Patient Information 2014 [place].   Return on days 3, 7 and 14 for more rabies injections. (FEB 5th, 12th and 26th) Triple antibiotic ointment to wound site and monitor for signs of infection

## 2013-03-17 NOTE — ED Notes (Signed)
Pt says she was bitten by a dog this am.  Abrasion to pt post thigh, bitten thru clothing. Pt says the dog belongs to a neighbor.

## 2013-03-17 NOTE — ED Notes (Signed)
I contacted [employer],  He will talk with the animal control officer and let me know about rabies status.

## 2013-03-17 NOTE — ED Notes (Signed)
Patient bite by dog on right leg. Per patient ? [place] husky. Animal control contacted and has dog in custody. Animal control supposed to contact patient about animals vaccinations after talking to owner.  Patient unsure of last tetanus vaccine.

## 2013-03-20 ENCOUNTER — Encounter (HOSPITAL_COMMUNITY)
Admission: RE | Admit: 2013-03-20 | Discharge: 2013-03-20 | Disposition: A | Discharge status: Home / Self Care | Payer: BC Managed Care – PPO | Source: Ambulatory Visit | Attending: Emergency Medicine | Admitting: Emergency Medicine

## 2013-03-20 DIAGNOSIS — Z203 Contact with and (suspected) exposure to rabies: Secondary | ICD-10-CM | POA: Insufficient documentation

## 2013-03-20 MED ORDER — RABIES VACCINE, PCEC IM SUSR
1.0000 mL | Freq: Once | INTRAMUSCULAR | Status: AC
  Administered 2013-03-20: 1 mL via INTRAMUSCULAR
  Filled 2013-03-20: qty 1

## 2013-03-20 NOTE — Progress Notes (Signed)
Arrived for next dose rabies vacc. Given left deltoid. Tolerated well.

## 2013-03-27 ENCOUNTER — Encounter (HOSPITAL_COMMUNITY)
Admission: RE | Admit: 2013-03-27 | Discharge: 2013-03-27 | Disposition: A | Discharge status: Home / Self Care | Payer: BC Managed Care – PPO | Source: Ambulatory Visit | Attending: Emergency Medicine | Admitting: Emergency Medicine

## 2013-03-27 MED ORDER — RABIES VACCINE, PCEC IM SUSR
INTRAMUSCULAR | Status: AC
  Filled 2013-03-27: qty 1

## 2013-03-27 MED ORDER — RABIES VACCINE, PCEC IM SUSR
1.0000 mL | Freq: Once | INTRAMUSCULAR | Status: AC
  Administered 2013-03-27: 1 mL via INTRAMUSCULAR

## 2013-03-27 NOTE — Progress Notes (Signed)
Arrived for next scheduled dose RabAvert. Med given right deltoid. Tolerated well.

## 2013-04-10 ENCOUNTER — Encounter (HOSPITAL_COMMUNITY): Admission: RE | Admit: 2013-04-10 | Payer: BC Managed Care – PPO | Source: Ambulatory Visit

## 2013-04-11 ENCOUNTER — Encounter (HOSPITAL_COMMUNITY)
Admission: RE | Admit: 2013-04-11 | Discharge: 2013-04-11 | Disposition: A | Discharge status: Home / Self Care | Payer: BC Managed Care – PPO | Source: Ambulatory Visit | Attending: Emergency Medicine | Admitting: Emergency Medicine

## 2013-04-11 MED ORDER — RABIES VACCINE, PCEC IM SUSR
INTRAMUSCULAR | Status: AC
  Filled 2013-04-11: qty 1

## 2013-04-11 MED ORDER — RABIES VACCINE, PCEC IM SUSR
1.0000 mL | Freq: Once | INTRAMUSCULAR | Status: AC
  Administered 2013-04-11: 1 mL via INTRAMUSCULAR

## 2013-09-01 ENCOUNTER — Other Ambulatory Visit (HOSPITAL_COMMUNITY): Payer: Self-pay | Admitting: Internal Medicine

## 2013-09-01 ENCOUNTER — Ambulatory Visit (HOSPITAL_COMMUNITY)
Admission: RE | Admit: 2013-09-01 | Discharge: 2013-09-01 | Disposition: A | Discharge status: Home / Self Care | Payer: BC Managed Care – PPO | Source: Ambulatory Visit | Attending: Internal Medicine | Admitting: Internal Medicine

## 2013-09-01 DIAGNOSIS — M79609 Pain in unspecified limb: Secondary | ICD-10-CM | POA: Insufficient documentation

## 2013-09-01 DIAGNOSIS — M898X5 Other specified disorders of bone, thigh: Secondary | ICD-10-CM

## 2013-09-01 DIAGNOSIS — M25551 Pain in right hip: Secondary | ICD-10-CM

## 2013-09-25 ENCOUNTER — Ambulatory Visit (INDEPENDENT_AMBULATORY_CARE_PROVIDER_SITE_OTHER): Payer: BC Managed Care – PPO | Admitting: Orthopedic Surgery

## 2013-09-25 VITALS — BP 148/86 | Ht 62.0 in | Wt 216.0 lb

## 2013-09-25 DIAGNOSIS — S76011A Strain of muscle, fascia and tendon of right hip, initial encounter: Secondary | ICD-10-CM

## 2013-09-25 DIAGNOSIS — IMO0002 Reserved for concepts with insufficient information to code with codable children: Secondary | ICD-10-CM

## 2013-09-25 MED ORDER — PREDNISONE 10 MG PO KIT: 10.0000 mg | PACK | ORAL | Status: DC

## 2013-09-25 NOTE — Patient Instructions (Signed)
Take prednisone dose pack   Take it easy for next 3 weeks

## 2013-09-26 NOTE — Progress Notes (Signed)
Chief Complaint  Patient presents with  . Leg Pain    Right groin pain x 5 weeks, no known injury    BP 148/86  Ht 5\' 2"  (1.575 m)  Wt 216 lb (97.977 kg)  BMI 39.50 kg/m2  LMP 11/14/2011  NEW PROBLEM:CONSULT-DR FAGAN 43 year old female has 5 weeks of pain in her right groin after being startled by a dog. She was bitten recently and since that time has a fear of dogs. She complains of pain and numbness in the right groin which is constant 9/10 unrelieved by diclofenac x-rays were taken they were normal.  Review of systems is negative patient is very healthy  Past Medical History  Diagnosis Date  . HTN (hypertension)    Past Surgical History  Procedure Laterality Date  . Cesarean section      x 2  . Abdominal hysterectomy       Vital signs are stable as recorded  General appearance is normal, body habitus normal  The patient is alert and oriented x 3  The patient's mood and affect are normal  Gait assessment: Normal  The cardiovascular exam reveals normal pulses and temperature without edema or  swelling.  The lymphatic system is negative for palpable lymph nodes  The sensory exam is normal.  There are no pathologic reflexes.  Balance is normal.   Exam of the left hip is normal  Inspection right hip has tenderness in the groin Range of motion is painful range of motion in flexion and internal rotation Stability remained stable Strength grade 5 motor strength  Skin normal, no rash, or laceration. Provocative tests flexion abduction internal rotation test is positive to suggest labral tear  A/P X-rays negative  Hip pain  Recommend steroid Dosepak if no improvement consider MRI with contrast to diagnose labral tear

## 2013-10-16 ENCOUNTER — Ambulatory Visit (INDEPENDENT_AMBULATORY_CARE_PROVIDER_SITE_OTHER): Payer: BC Managed Care – PPO | Admitting: Orthopedic Surgery

## 2013-10-16 ENCOUNTER — Encounter: Payer: Self-pay | Admitting: Orthopedic Surgery

## 2013-10-16 VITALS — BP 151/103 | Ht 62.0 in | Wt 216.0 lb

## 2013-10-16 DIAGNOSIS — M1610 Unilateral primary osteoarthritis, unspecified hip: Secondary | ICD-10-CM

## 2013-10-16 DIAGNOSIS — M169 Osteoarthritis of hip, unspecified: Secondary | ICD-10-CM

## 2013-10-16 DIAGNOSIS — M24159 Other articular cartilage disorders, unspecified hip: Secondary | ICD-10-CM

## 2013-10-16 NOTE — Patient Instructions (Signed)
We will schedule MRI for you

## 2013-10-16 NOTE — Progress Notes (Signed)
Chief Complaint  Patient presents with  . Follow-up    3 week recheck right hip flexor strain s/p medication   Recheck problem worse established patient  The patient had an injury to her right hip she was placed on anti-inflammatory meloxicam than steroid Dosepak no improvement  Review of systems no radicular symptoms at this point. She complains of pain when she's sitting and walking and pivoting on the right lower extremity  Repeat examination today shows well-developed well-nourished female oriented x3 mood and affect normal slight abnormality with a limp favoring the right leg pain with flexion and internal rotation of the hip consistent with labral tear strength and stability are normal  Left hip flexion internal rotation is not using symptoms neurovascular exam intact  Recommend MRI right hip with contrast if possible but if iodinated dyes needed for the contrast have to do a without  Diagnosis labral tear right hip

## 2013-10-22 ENCOUNTER — Other Ambulatory Visit: Payer: Self-pay | Admitting: *Deleted

## 2013-10-22 DIAGNOSIS — M24159 Other articular cartilage disorders, unspecified hip: Secondary | ICD-10-CM

## 2013-10-22 DIAGNOSIS — M169 Osteoarthritis of hip, unspecified: Principal | ICD-10-CM

## 2013-10-23 ENCOUNTER — Telehealth: Payer: Self-pay | Admitting: Orthopedic Surgery

## 2013-10-23 NOTE — Telephone Encounter (Signed)
Regarding MRI CPT [id number], right hip w/contrast, received pre-authorization [id number], valid to 11/20/13, per [facility] appointment scheduled, Forestine Na, 10/30/13, 9:45am, patient notified.  Follow up appointment scheduled for results; patient aware.

## 2013-10-30 ENCOUNTER — Ambulatory Visit (HOSPITAL_COMMUNITY)
Admission: RE | Admit: 2013-10-30 | Discharge: 2013-10-30 | Disposition: A | Discharge status: Home / Self Care | Payer: BC Managed Care – PPO | Source: Ambulatory Visit | Attending: Orthopedic Surgery | Admitting: Orthopedic Surgery

## 2013-10-30 ENCOUNTER — Encounter (HOSPITAL_COMMUNITY): Payer: Self-pay

## 2013-10-30 ENCOUNTER — Other Ambulatory Visit: Payer: Self-pay | Admitting: Orthopedic Surgery

## 2013-10-30 DIAGNOSIS — M169 Osteoarthritis of hip, unspecified: Secondary | ICD-10-CM

## 2013-10-30 DIAGNOSIS — M24159 Other articular cartilage disorders, unspecified hip: Secondary | ICD-10-CM

## 2013-10-30 DIAGNOSIS — M25559 Pain in unspecified hip: Secondary | ICD-10-CM | POA: Insufficient documentation

## 2013-10-30 DIAGNOSIS — M2240 Chondromalacia patellae, unspecified knee: Secondary | ICD-10-CM | POA: Insufficient documentation

## 2013-10-30 MED ORDER — POVIDONE-IODINE 10 % EX SOLN
CUTANEOUS | Status: AC
  Filled 2013-10-30: qty 15

## 2013-10-30 MED ORDER — LIDOCAINE HCL (PF) 1 % IJ SOLN
INTRAMUSCULAR | Status: AC
  Filled 2013-10-30: qty 5

## 2013-10-30 MED ORDER — IOHEXOL 300 MG/ML  SOLN
50.0000 mL | Freq: Once | INTRAMUSCULAR | Status: AC | PRN
  Administered 2013-10-30: 1 mL via INTRAVENOUS

## 2013-10-30 MED ORDER — GADOBENATE DIMEGLUMINE 529 MG/ML IV SOLN
5.0000 mL | Freq: Once | INTRAVENOUS | Status: AC | PRN
  Administered 2013-10-30: 0.05 mL via INTRAVENOUS

## 2013-10-30 NOTE — Progress Notes (Signed)
Hip injection completed no signs of distress of signs or reaction. Pt premedicated prior to arrival per MD.

## 2013-11-06 ENCOUNTER — Ambulatory Visit (INDEPENDENT_AMBULATORY_CARE_PROVIDER_SITE_OTHER): Payer: BC Managed Care – PPO | Admitting: Orthopedic Surgery

## 2013-11-06 DIAGNOSIS — M1611 Unilateral primary osteoarthritis, right hip: Secondary | ICD-10-CM

## 2013-11-06 DIAGNOSIS — M1610 Unilateral primary osteoarthritis, unspecified hip: Secondary | ICD-10-CM

## 2013-11-06 MED ORDER — ACETAMINOPHEN-CODEINE 300-30 MG PO TABS: 1.0000 | ORAL_TABLET | Freq: Four times a day (QID) | ORAL | Status: DC | PRN

## 2013-11-06 MED ORDER — NABUMETONE 750 MG PO TABS: 750.0000 mg | ORAL_TABLET | Freq: Every day | ORAL | Status: DC

## 2013-11-06 NOTE — Patient Instructions (Signed)
Take meds  Meds ordered this encounter  Medications  . nabumetone (RELAFEN) 750 MG tablet    Sig: Take 1 tablet (750 mg total) by mouth daily.    Dispense:  60 tablet    Refill:  2  . Acetaminophen-Codeine 300-30 MG per tablet    Sig: Take 1 tablet by mouth every 6 (six) hours as needed for pain.    Dispense:  60 tablet    Refill:  2

## 2013-11-06 NOTE — Progress Notes (Signed)
Followup visit established patient no improvement  43 year old female twisted her right leg and she was startled by a dog in late June early July of 2015 she was thought to have a strain of her hip was treated with diclofenac and then oral prednisone did not improve and was then sent for MRI for possible labral tear. MRI without contrast showed grade 4 chondral lesion of her acetabulum. This is unusual in that she did not have any groin pain prior to this twisting injury.  Review of systems is negative she is very healthy  The MRI showed a chondral lesion of her hip joint without evidence of loose body or chondral flap  At this point I recommend continued anti-inflammatory medications and pain medications to see if this will help if not I've advised her we will send her to the hip specialist at [facility] for possible hip arthroscopy

## 2013-11-17 ENCOUNTER — Encounter: Payer: Self-pay | Admitting: Orthopedic Surgery

## 2013-12-15 ENCOUNTER — Ambulatory Visit (INDEPENDENT_AMBULATORY_CARE_PROVIDER_SITE_OTHER): Payer: BC Managed Care – PPO | Admitting: Orthopedic Surgery

## 2013-12-15 ENCOUNTER — Encounter: Payer: Self-pay | Admitting: Orthopedic Surgery

## 2013-12-15 VITALS — BP 131/93 | Ht 62.0 in | Wt 216.0 lb

## 2013-12-15 DIAGNOSIS — M1990 Unspecified osteoarthritis, unspecified site: Secondary | ICD-10-CM

## 2013-12-15 DIAGNOSIS — M1611 Unilateral primary osteoarthritis, right hip: Secondary | ICD-10-CM

## 2013-12-15 MED ORDER — ACETAMINOPHEN-CODEINE 300-30 MG PO TABS: 1.0000 | ORAL_TABLET | Freq: Four times a day (QID) | ORAL | Status: DC | PRN

## 2013-12-15 MED ORDER — NABUMETONE 750 MG PO TABS: 750.0000 mg | ORAL_TABLET | Freq: Every day | ORAL | Status: DC

## 2013-12-15 NOTE — Progress Notes (Signed)
Patient ID: Sonya Kline, female   DOB: 1970-11-24, 43 y.o.   MRN: [id number] Chief Complaint  Patient presents with  . Follow-up    Recheck right hip response to medication    Improve status post implementation of nabumetone 500 mg twice a day andTylenol with codeine No. 3  Review of systems denies catching locking giving way.  BP 131/93 mmHg  Ht 5\' 2"  (1.575 m)  Wt 216 lb (97.977 kg)  BMI 39.50 kg/m2  LMP 11/14/2011  She is awake alert and oriented 3 mood and affect are normal. She walks without assistive device. We did not limping today.  Hip flexion is normal. She has painless internal rotation of the hip and it equals the opposite hip. Leg lengths remain equal. Muscle tone and strength are normal and the hip is stable with normal neurovascular signs distally  Impression osteoarthritis of the hip  Continue current medications follow-up 3 months

## 2013-12-20 ENCOUNTER — Emergency Department (HOSPITAL_COMMUNITY)
Admission: EM | Admit: 2013-12-20 | Discharge: 2013-12-20 | Disposition: A | Discharge status: Home / Self Care | Payer: BC Managed Care – PPO | Attending: Emergency Medicine | Admitting: Emergency Medicine

## 2013-12-20 ENCOUNTER — Encounter (HOSPITAL_COMMUNITY): Payer: Self-pay | Admitting: Emergency Medicine

## 2013-12-20 DIAGNOSIS — T7840XA Allergy, unspecified, initial encounter: Secondary | ICD-10-CM

## 2013-12-20 DIAGNOSIS — Z792 Long term (current) use of antibiotics: Secondary | ICD-10-CM | POA: Diagnosis not present

## 2013-12-20 DIAGNOSIS — Z79899 Other long term (current) drug therapy: Secondary | ICD-10-CM | POA: Diagnosis not present

## 2013-12-20 DIAGNOSIS — Y9389 Activity, other specified: Secondary | ICD-10-CM | POA: Insufficient documentation

## 2013-12-20 DIAGNOSIS — I10 Essential (primary) hypertension: Secondary | ICD-10-CM | POA: Insufficient documentation

## 2013-12-20 DIAGNOSIS — R21 Rash and other nonspecific skin eruption: Secondary | ICD-10-CM | POA: Diagnosis present

## 2013-12-20 DIAGNOSIS — L500 Allergic urticaria: Secondary | ICD-10-CM | POA: Insufficient documentation

## 2013-12-20 DIAGNOSIS — Z7952 Long term (current) use of systemic steroids: Secondary | ICD-10-CM | POA: Insufficient documentation

## 2013-12-20 DIAGNOSIS — Y9289 Other specified places as the place of occurrence of the external cause: Secondary | ICD-10-CM | POA: Insufficient documentation

## 2013-12-20 DIAGNOSIS — Z791 Long term (current) use of non-steroidal anti-inflammatories (NSAID): Secondary | ICD-10-CM | POA: Diagnosis not present

## 2013-12-20 DIAGNOSIS — T50991A Poisoning by other drugs, medicaments and biological substances, accidental (unintentional), initial encounter: Secondary | ICD-10-CM | POA: Diagnosis not present

## 2013-12-20 MED ORDER — PREDNISONE 10 MG PO TABS: 20.0000 mg | ORAL_TABLET | Freq: Every day | ORAL | Status: DC

## 2013-12-20 MED ORDER — PREDNISONE 50 MG PO TABS
60.0000 mg | ORAL_TABLET | Freq: Once | ORAL | Status: AC
  Administered 2013-12-20: 60 mg via ORAL
  Filled 2013-12-20 (×2): qty 1

## 2013-12-20 MED ORDER — HYDROCODONE-ACETAMINOPHEN 5-325 MG PO TABS: 2.0000 | ORAL_TABLET | ORAL | Status: DC | PRN

## 2013-12-20 MED ORDER — HYDROXYZINE HCL 25 MG PO TABS: 25.0000 mg | ORAL_TABLET | Freq: Four times a day (QID) | ORAL | Status: DC | PRN

## 2013-12-20 NOTE — Discharge Instructions (Signed)

## 2013-12-20 NOTE — ED Provider Notes (Addendum)
CSN: [id number]     Arrival date & time 12/20/13  1005 History  This chart was scribe for Sonya Furry, MD by Judithann Sauger, ED Scribe. The patient was seen in room APA04/APA04 and the patient's care was started at 10:20 AM.    Chief Complaint  Patient presents with  . Allergic Reaction    The history is provided by the patient. No language interpreter was used.   HPI Comments: Sonya Kline is a 43 y.o. female who presents to the Emergency Department complaining of an allergic reaction onset 2 days ago after taking Codeine and a Relafen. She states that her symptoms started the next day after taking the medication. She reports associated rash and redness to the skin, face, and face. She took benadryl with no relief. She denies any previous symptoms and reports NKDA. She states that she stopped taking the codeine and Relafen 2 days ago after onset of rash.    Past Medical History  Diagnosis Date  . HTN (hypertension)    Past Surgical History  Procedure Laterality Date  . Cesarean section      x 2  . Abdominal hysterectomy     History reviewed. No pertinent family history. History  Substance Use Topics  . Smoking status: Never Smoker   . Smokeless tobacco: Never Used  . Alcohol Use: No   OB History    Gravida Para Term Preterm AB TAB SAB Ectopic Multiple Living   _0 Review of Systems  Constitutional: Negative for fever, chills, diaphoresis, appetite change and fatigue.  HENT: Negative for mouth sores and sore throat.   Eyes: Negative for visual disturbance.  Respiratory: Negative for cough, chest tightness and shortness of breath.   Cardiovascular: Negative for chest pain.  Gastrointestinal: Negative for nausea, vomiting, abdominal pain, diarrhea and abdominal distention.  Endocrine: Negative for polydipsia, polyphagia and polyuria.  Genitourinary: Negative for dysuria, frequency and hematuria.  Musculoskeletal: Negative for gait problem.  Skin:  Positive for rash. Negative for color change and pallor.  Neurological: Negative for dizziness, syncope, light-headedness and headaches.  Hematological: Does not bruise/bleed easily.  Psychiatric/Behavioral: Negative for behavioral problems and confusion.      Allergies  Contrast media  Home Medications   Prior to Admission medications   Medication Sig Start Date End Date Taking? Authorizing Provider  Acetaminophen-Codeine 300-30 MG per tablet Take 1 tablet by mouth every 6 (six) hours as needed for pain. 12/15/13   Carole Civil, MD  amLODipine (NORVASC) 5 MG tablet Take 5 mg by mouth daily.     Historical Provider, MD  amoxicillin-clavulanate (AUGMENTIN) 875-125 MG per tablet Take 1 tablet by mouth every 12 (twelve) hours. 03/17/13   Ezequiel Essex, MD  hydrOXYzine (ATARAX/VISTARIL) 25 MG tablet Take 1 tablet (25 mg total) by mouth every 6 (six) hours as needed for itching. 12/20/13   Sonya Furry, MD  nabumetone (RELAFEN) 750 MG tablet Take 1 tablet (750 mg total) by mouth daily. 12/15/13   Carole Civil, MD  PredniSONE 10 MG KIT Take 1 kit (10 mg total) by mouth as directed. 10 mg 12 day double strength Dosepak as directed 09/25/13   Carole Civil, MD   BP 157/102 mmHg  Pulse 106  Temp(Src) 98.7 F (37.1 C) (Oral)  Resp 16  Ht _1  (1.575 m)  Wt 204 lb (92.534 kg)  BMI 37.30 kg/m2  SpO2 99%  LMP 11/14/2011 Physical  Exam  Constitutional: She is oriented to person, place, and time. She appears well-developed and well-nourished. No distress.  HENT:  Head: Normocephalic.  Eyes: Conjunctivae are normal. Pupils are equal, round, and reactive to light. No scleral icterus.  Neck: Normal range of motion. Neck supple. No thyromegaly present.  Cardiovascular: Normal rate and regular rhythm.  Exam reveals no gallop and no friction rub.   No murmur heard. Pulmonary/Chest: Effort normal and breath sounds normal. No respiratory distress. She has no wheezes. She has no rales.   Abdominal: Soft. Bowel sounds are normal. She exhibits no distension. There is no tenderness. There is no rebound.  Musculoskeletal: Normal range of motion.  Neurological: She is alert and oriented to person, place, and time.  Skin: Skin is warm and dry. Rash noted.  Diffused red rash and occasional urticaria   Psychiatric: She has a normal mood and affect. Her behavior is normal.  Nursing note and vitals reviewed.   ED Course  Procedures (including critical care time) DIAGNOSTIC STUDIES: Oxygen Saturation is 99% on RA, normal by my interpretation.    COORDINATION OF CARE: 10:24 AM- Pt advised of plan for treatment and pt agrees.  Labs Review Labs Reviewed - No data to display  Imaging Review No results found.   EKG Interpretation None      MDM   Final diagnoses:  Allergic reaction, initial encounter    Patient with urticarial skin reaction. No laryngeal or pulmonary reaction. Plan is prednisone, Atarax. PCP  follow-up. ER if any worsening I personally performed the services described in this documentation, which was scribed in my presence. The recorded information has been reviewed and is accurate.     Sonya Furry, MD 12/20/13 [place], MD 12/20/13 1043

## 2013-12-20 NOTE — ED Notes (Signed)
Pt had pain medication she started for a pulled muscle and states she began having itching everywhere. She states she stopped her medication 2 days ago but is still itching.

## 2013-12-20 NOTE — ED Notes (Signed)
Pt declined prednisone RX. Stated it made her have shingles

## 2014-03-17 ENCOUNTER — Ambulatory Visit: Payer: BC Managed Care – PPO | Admitting: Orthopedic Surgery

## 2015-05-05 ENCOUNTER — Emergency Department (HOSPITAL_COMMUNITY): Payer: BLUE CROSS/BLUE SHIELD

## 2015-05-05 ENCOUNTER — Encounter (HOSPITAL_COMMUNITY): Payer: Self-pay | Admitting: Emergency Medicine

## 2015-05-05 ENCOUNTER — Emergency Department (HOSPITAL_COMMUNITY)
Admission: EM | Admit: 2015-05-05 | Discharge: 2015-05-05 | Disposition: A | Discharge status: Home / Self Care | Payer: BLUE CROSS/BLUE SHIELD | Attending: Emergency Medicine | Admitting: Emergency Medicine

## 2015-05-05 DIAGNOSIS — I10 Essential (primary) hypertension: Secondary | ICD-10-CM | POA: Diagnosis not present

## 2015-05-05 DIAGNOSIS — J40 Bronchitis, not specified as acute or chronic: Secondary | ICD-10-CM | POA: Insufficient documentation

## 2015-05-05 DIAGNOSIS — Z792 Long term (current) use of antibiotics: Secondary | ICD-10-CM | POA: Insufficient documentation

## 2015-05-05 DIAGNOSIS — Z79899 Other long term (current) drug therapy: Secondary | ICD-10-CM | POA: Diagnosis not present

## 2015-05-05 DIAGNOSIS — R05 Cough: Secondary | ICD-10-CM | POA: Diagnosis present

## 2015-05-05 MED ORDER — AMOXICILLIN 500 MG PO CAPS: 500.0000 mg | ORAL_CAPSULE | Freq: Three times a day (TID) | ORAL | Status: DC

## 2015-05-05 NOTE — ED Notes (Signed)
Pt states she has had a dry, nonproductive cough for over a week.  Was given Tessalon with no relief.

## 2015-05-05 NOTE — ED Provider Notes (Signed)
CSN: [id number]     Arrival date & time 05/05/15  1427 History   First MD Initiated Contact with Patient 05/05/15 1525     Chief Complaint  Patient presents with  . Cough     (Consider location/radiation/quality/duration/timing/severity/associated sxs/prior Treatment) Patient is a 45 y.o. female presenting with cough. No language interpreter was used.  Cough Cough characteristics:  Productive Sputum characteristics:  Nondescript Severity:  Moderate Onset quality:  Gradual Timing:  Constant Progression:  Worsening Chronicity:  New Smoker: no   Context: upper respiratory infection   Relieved by:  Nothing Worsened by:  Nothing tried Associated symptoms: no chills, no fever and no sore throat     Past Medical History  Diagnosis Date  . HTN (hypertension)    Past Surgical History  Procedure Laterality Date  . Cesarean section      x 2  . Abdominal hysterectomy     History reviewed. No pertinent family history. Social History  Substance Use Topics  . Smoking status: Never Smoker   . Smokeless tobacco: Never Used  . Alcohol Use: No   OB History    Gravida Para Term Preterm AB TAB SAB Ectopic Multiple Living   _0 Review of Systems  Constitutional: Negative for fever and chills.  HENT: Negative for sore throat.   Respiratory: Positive for cough.       Allergies  Contrast media and Prednisone  Home Medications   Prior to Admission medications   Medication Sig Start Date End Date Taking? Authorizing Provider  amLODipine (NORVASC) 5 MG tablet Take 5 mg by mouth daily.     Historical Provider, MD  amoxicillin (AMOXIL) 500 MG capsule Take 1 capsule (500 mg total) by mouth 3 (three) times daily. 05/05/15   Fransico Meadow, PA-C  amoxicillin-clavulanate (AUGMENTIN) 875-125 MG per tablet Take 1 tablet by mouth every 12 (twelve) hours. Patient not taking: Reported on 12/20/2013 03/17/13   Ezequiel Essex, MD  HYDROcodone-acetaminophen (NORCO/VICODIN) 5-325  MG per tablet Take 2 tablets by mouth every 4 (four) hours as needed. 12/20/13   [name], MD  hydrOXYzine (ATARAX/VISTARIL) 25 MG tablet Take 1 tablet (25 mg total) by mouth every 6 (six) hours as needed for itching. 12/20/13   [name], MD  predniSONE (DELTASONE) 10 MG tablet Take 2 tablets (20 mg total) by mouth daily. 12/20/13   [name], MD  PredniSONE 10 MG KIT Take 1 kit (10 mg total) by mouth as directed. 10 mg 12 day double strength Dosepak as directed Patient not taking: Reported on 12/20/2013 09/25/13   Carole Civil, MD   BP 158/95 mmHg  Pulse 117  Temp(Src) 99.2 F (37.3 C) (Oral)  Resp 18  Ht _1  (1.575 m)  Wt 92.534 kg  BMI 37.30 kg/m2  SpO2 99%  LMP 11/14/2011 Physical Exam  Constitutional: She is oriented to person, place, and time. She appears well-developed and well-nourished.  HENT:  Head: Normocephalic.  Right Ear: External ear normal.  Left Ear: External ear normal.  Nose: Nose normal.  Mouth/Throat: Oropharynx is clear and moist.  Eyes: Conjunctivae and EOM are normal. Pupils are equal, round, and reactive to light.  Neck: Normal range of motion.  Cardiovascular: Normal rate and normal heart sounds.   Pulmonary/Chest: Effort normal and breath sounds normal.  Abdominal: She exhibits no distension.  Musculoskeletal: Normal range of motion.  Neurological: She is alert and oriented to person, place,  and time.  Skin: Skin is warm.  Psychiatric: She has a normal mood and affect.  Nursing note and vitals reviewed.   ED Course  Procedures (including critical care time) Labs Review Labs Reviewed - No data to display  Imaging Review Dg Chest 2 View  05/05/2015  CLINICAL DATA:  Nonproductive cough for 1 week.  Chest pain. EXAM: CHEST  2 VIEW COMPARISON:  11/21/2011 FINDINGS: The heart size and mediastinal contours are within normal limits. Both lungs are clear. Previously seen right upper lobe infiltrate has resolved since previous study. The visualized  skeletal structures are unremarkable. IMPRESSION: No active cardiopulmonary disease. Electronically Signed   By: Earle Gell M.D.   On: 05/05/2015 14:55   I have personally reviewed and evaluated these images and lab results as part of my medical decision-making.   EKG Interpretation None      MDM   Final diagnoses:  Bronchitis    An After Visit Summary was printed and given to the patient. Meds ordered this encounter  Medications  . amoxicillin (AMOXIL) 500 MG capsule    Sig: Take 1 capsule (500 mg total) by mouth 3 (three) times daily.    Dispense:  30 capsule    Refill:  0    Order Specific Question:  Supervising Provider    Answer:  Noemi Chapel Whitakers, PA-C 05/05/15 [place], MD 05/05/15 [phone]

## 2015-05-05 NOTE — Discharge Instructions (Signed)

## 2015-09-06 DIAGNOSIS — I10 Essential (primary) hypertension: Secondary | ICD-10-CM | POA: Diagnosis not present

## 2016-03-06 DIAGNOSIS — Z79899 Other long term (current) drug therapy: Secondary | ICD-10-CM | POA: Diagnosis not present

## 2016-03-06 DIAGNOSIS — I10 Essential (primary) hypertension: Secondary | ICD-10-CM | POA: Diagnosis not present

## 2016-03-23 DIAGNOSIS — E785 Hyperlipidemia, unspecified: Secondary | ICD-10-CM | POA: Diagnosis not present

## 2016-03-23 DIAGNOSIS — Z6839 Body mass index (BMI) 39.0-39.9, adult: Secondary | ICD-10-CM | POA: Diagnosis not present

## 2016-03-23 DIAGNOSIS — I10 Essential (primary) hypertension: Secondary | ICD-10-CM | POA: Diagnosis not present

## 2016-03-24 ENCOUNTER — Other Ambulatory Visit (HOSPITAL_COMMUNITY): Payer: Self-pay | Admitting: Internal Medicine

## 2016-03-24 DIAGNOSIS — Z1231 Encounter for screening mammogram for malignant neoplasm of breast: Secondary | ICD-10-CM

## 2016-03-30 ENCOUNTER — Ambulatory Visit (HOSPITAL_COMMUNITY): Payer: BLUE CROSS/BLUE SHIELD

## 2016-04-03 ENCOUNTER — Encounter (HOSPITAL_COMMUNITY): Payer: Self-pay

## 2016-04-03 ENCOUNTER — Ambulatory Visit (HOSPITAL_COMMUNITY)
Admission: RE | Admit: 2016-04-03 | Discharge: 2016-04-03 | Disposition: A | Discharge status: Home / Self Care | Payer: BLUE CROSS/BLUE SHIELD | Source: Ambulatory Visit | Attending: Internal Medicine | Admitting: Internal Medicine

## 2016-04-03 DIAGNOSIS — Z1231 Encounter for screening mammogram for malignant neoplasm of breast: Secondary | ICD-10-CM | POA: Diagnosis not present

## 2016-08-21 DIAGNOSIS — B351 Tinea unguium: Secondary | ICD-10-CM | POA: Diagnosis not present

## 2016-08-21 DIAGNOSIS — L600 Ingrowing nail: Secondary | ICD-10-CM | POA: Diagnosis not present

## 2016-11-14 ENCOUNTER — Emergency Department (HOSPITAL_COMMUNITY)
Admission: EM | Admit: 2016-11-14 | Discharge: 2016-11-14 | Disposition: A | Discharge status: Home / Self Care | Payer: BLUE CROSS/BLUE SHIELD | Attending: Emergency Medicine | Admitting: Emergency Medicine

## 2016-11-14 ENCOUNTER — Encounter (HOSPITAL_COMMUNITY): Payer: Self-pay | Admitting: Cardiology

## 2016-11-14 ENCOUNTER — Emergency Department (HOSPITAL_COMMUNITY): Payer: BLUE CROSS/BLUE SHIELD

## 2016-11-14 DIAGNOSIS — N200 Calculus of kidney: Secondary | ICD-10-CM

## 2016-11-14 DIAGNOSIS — N83209 Unspecified ovarian cyst, unspecified side: Secondary | ICD-10-CM | POA: Diagnosis not present

## 2016-11-14 DIAGNOSIS — I10 Essential (primary) hypertension: Secondary | ICD-10-CM | POA: Insufficient documentation

## 2016-11-14 DIAGNOSIS — Z79899 Other long term (current) drug therapy: Secondary | ICD-10-CM | POA: Diagnosis not present

## 2016-11-14 DIAGNOSIS — R1084 Generalized abdominal pain: Secondary | ICD-10-CM | POA: Diagnosis not present

## 2016-11-14 DIAGNOSIS — N83299 Other ovarian cyst, unspecified side: Secondary | ICD-10-CM | POA: Diagnosis not present

## 2016-11-14 DIAGNOSIS — N132 Hydronephrosis with renal and ureteral calculous obstruction: Secondary | ICD-10-CM | POA: Diagnosis not present

## 2016-11-14 LAB — COMPREHENSIVE METABOLIC PANEL
ALT: 20 U/L (ref 14–54)
AST: 25 U/L (ref 15–41)
Albumin: 3.8 g/dL (ref 3.5–5.0)
Alkaline Phosphatase: 81 U/L (ref 38–126)
Anion gap: 10 (ref 5–15)
BUN: 14 mg/dL (ref 6–20)
CHLORIDE: 101 mmol/L (ref 101–111)
CO2: 29 mmol/L (ref 22–32)
Calcium: 9.3 mg/dL (ref 8.9–10.3)
Creatinine, Ser: 1 mg/dL (ref 0.44–1.00)
GFR calc non Af Amer: >60 mL/min (ref >60)
Glucose, Bld: 105 mg/dL — ABNORMAL HIGH (ref 65–99)
Potassium: 3.8 mmol/L (ref 3.5–5.1)
SODIUM: 140 mmol/L (ref 135–145)
Total Bilirubin: 0.3 mg/dL (ref 0.3–1.2)
Total Protein: 8.3 g/dL — ABNORMAL HIGH (ref 6.5–8.1)

## 2016-11-14 LAB — CBC WITH DIFFERENTIAL/PLATELET
BASOS PCT: 0 %
Basophils Absolute: 0 10*3/uL (ref 0.0–0.1)
EOS PCT: 2 %
Eosinophils Absolute: 0.2 10*3/uL (ref 0.0–0.7)
HCT: 40.1 % (ref 36.0–46.0)
Hemoglobin: 13.1 g/dL (ref 12.0–15.0)
Lymphocytes Relative: 26 %
Lymphs Abs: 3.6 10*3/uL (ref 0.7–4.0)
MCH: 26.5 pg (ref 26.0–34.0)
MCHC: 32.7 g/dL (ref 30.0–36.0)
MCV: 81 fL (ref 78.0–100.0)
MONOS PCT: 8 %
Monocytes Absolute: 1.1 10*3/uL — ABNORMAL HIGH (ref 0.1–1.0)
NEUTROS ABS: 8.9 10*3/uL — AB (ref 1.7–7.7)
NEUTROS PCT: 64 %
PLATELETS: 357 10*3/uL (ref 150–400)
RBC: 4.95 MIL/uL (ref 3.87–5.11)
RDW: 15.5 % (ref 11.5–15.5)
WBC: 13.9 10*3/uL — AB (ref 4.0–10.5)

## 2016-11-14 LAB — URINALYSIS, ROUTINE W REFLEX MICROSCOPIC
Bilirubin Urine: NEGATIVE
Glucose, UA: NEGATIVE mg/dL
Ketones, ur: NEGATIVE mg/dL
Leukocytes, UA: NEGATIVE
Nitrite: NEGATIVE
Protein, ur: NEGATIVE mg/dL
SPECIFIC GRAVITY, URINE: 1.01 (ref 1.005–1.030)
pH: 7 (ref 5.0–8.0)

## 2016-11-14 MED ORDER — OXYCODONE-ACETAMINOPHEN 5-325 MG PO TABS
1.0000 | ORAL_TABLET | Freq: Four times a day (QID) | ORAL | 0 refills | Status: DC | PRN
Start: 2016-11-14 — End: 2017-08-11

## 2016-11-14 MED ORDER — KETOROLAC TROMETHAMINE 30 MG/ML IJ SOLN
30.0000 mg | Freq: Once | INTRAMUSCULAR | Status: AC
  Administered 2016-11-14: 30 mg via INTRAVENOUS
  Filled 2016-11-14: qty 1

## 2016-11-14 MED ORDER — IBUPROFEN 800 MG PO TABS: 800.0000 mg | ORAL_TABLET | Freq: Three times a day (TID) | ORAL | 0 refills | Status: DC | PRN

## 2016-11-14 MED ORDER — ONDANSETRON 4 MG PO TBDP: 4.0000 mg | ORAL_TABLET | Freq: Three times a day (TID) | ORAL | 0 refills | Status: DC | PRN

## 2016-11-14 MED ORDER — TAMSULOSIN HCL 0.4 MG PO CAPS: 0.4000 mg | ORAL_CAPSULE | Freq: Every day | ORAL | 0 refills | Status: DC

## 2016-11-14 MED ORDER — ONDANSETRON HCL 4 MG/2ML IJ SOLN
4.0000 mg | Freq: Once | INTRAMUSCULAR | Status: AC
  Administered 2016-11-14: 4 mg via INTRAVENOUS
  Filled 2016-11-14: qty 2

## 2016-11-14 NOTE — ED Triage Notes (Signed)
Right flank pain times one hour.

## 2016-11-14 NOTE — ED Notes (Signed)
Pt states she cannot provide a urine at this time.  Call light in reach and son at bedside.

## 2016-11-14 NOTE — ED Provider Notes (Signed)
Emergency Department Provider Note   I have reviewed the triage vital signs and the nursing notes.   HISTORY  Chief Complaint Flank Pain   HPI KORINE WINTON is a 46 y.o. female with PMH of HTN and prior history of kidney stone w/o complication presents to the emergency department for evaluation of sudden onset right flank pain. Symptoms began abruptly 1 hour prior to ED presentation. She describes radiation from the right flank to the right inguinal crease. She states it's been many years since she had a kidney stone but this does feel similar to that episode. She is not followed by urology. She denies any associated fevers or chills. Eyes dysuria, hesitancy, urgency. No vaginal bleeding or discharge. Denies chest pain and difficulty breathing. Pain is severe with no modifying factors.    Past Medical History:  Diagnosis Date  . HTN (hypertension)     Patient Active Problem List   Diagnosis Date Noted  . Labral tear of hip, degenerative 10/16/2013  . Ankle pain, left 05/03/2011  . DIVERTICULITIS, HX OF 03/09/2009    Past Surgical History:  Procedure Laterality Date  . ABDOMINAL HYSTERECTOMY    . CESAREAN SECTION     x 2    Current Outpatient Rx  . Order #: [id number] Class: Historical Med  . Order #: [id number] Class: Print  . Order #: [id number] Class: Print  . Order #: [id number] Class: Print  . Order #: [id number] Class: Print    Allergies Contrast media [iodinated diagnostic agents] and Prednisone  History reviewed. No pertinent family history.  Social History Social History  Substance Use Topics  . Smoking status: Never Smoker  . Smokeless tobacco: Never Used  . Alcohol use No    Review of Systems  Constitutional: No fever/chills Eyes: No visual changes. ENT: No sore throat. Cardiovascular: Denies chest pain. Respiratory: Denies shortness of breath. Gastrointestinal: Positive right flank/abdominal pain. Positive nausea, no vomiting.  No diarrhea.  No  constipation. Genitourinary: Negative for dysuria. Musculoskeletal: Negative for back pain. Skin: Negative for rash. Neurological: Negative for headaches, focal weakness or numbness.  10-point ROS otherwise negative.  ____________________________________________   PHYSICAL EXAM:  VITAL SIGNS: ED Triage Vitals  Enc Vitals Group     BP 11/14/16 1724 (!) 167/100     Pulse Rate 11/14/16 1724 94     Resp 11/14/16 1724 18     Temp 11/14/16 1724 98.4 F (36.9 C)     Temp Source 11/14/16 1724 Oral     SpO2 11/14/16 1724 100 %     Weight 11/14/16 1723 204 lb (92.5 kg)     Height 11/14/16 1723 5\' 2"  (1.575 m)     Pain Score 11/14/16 1722 7   Constitutional: Alert and oriented. Appears uncomfortable. Sitting on edge of bed and frequently shifting in place.  Eyes: Conjunctivae are normal.  Head: Atraumatic. Nose: No congestion/rhinnorhea. Mouth/Throat: Mucous membranes are moist.  Neck: No stridor.  Cardiovascular: Normal rate, regular rhythm. Good peripheral circulation. Grossly normal heart sounds.   Respiratory: Normal respiratory effort.  No retractions. Lungs CTAB. Gastrointestinal: Soft and nontender. No distention.  Musculoskeletal: No lower extremity tenderness nor edema. No gross deformities of extremities. Neurologic:  Normal speech and language. No gross focal neurologic deficits are appreciated.  Skin:  Skin is warm, dry and intact. No rash noted.  ____________________________________________   LABS (all labs ordered are listed, but only abnormal results are displayed)  Labs Reviewed  COMPREHENSIVE METABOLIC PANEL - Abnormal; Notable for the following:  Result Value   Glucose, Bld 105 (*)    Total Protein 8.3 (*)    All other components within normal limits  CBC WITH DIFFERENTIAL/PLATELET - Abnormal; Notable for the following:    WBC 13.9 (*)    Neutro Abs 8.9 (*)    Monocytes Absolute 1.1 (*)    All other components within normal limits  URINALYSIS,  ROUTINE W REFLEX MICROSCOPIC - Abnormal; Notable for the following:    Color, Urine STRAW (*)    Hgb urine dipstick LARGE (*)    Bacteria, UA RARE (*)    Squamous Epithelial / LPF 0-5 (*)    All other components within normal limits  URINE CULTURE   ____________________________________________  RADIOLOGY  Ct Renal Stone Study  Result Date: 11/14/2016 CLINICAL DATA:  46 year old hypertensive female with right flank pain since this afternoon. History of kidney stones. Initial encounter. EXAM: CT ABDOMEN AND PELVIS WITHOUT CONTRAST TECHNIQUE: Multidetector CT imaging of the abdomen and pelvis was performed following the standard protocol without IV contrast. COMPARISON:  10/17/2008 CT. FINDINGS: Lower chest: Tiny lung base nodules (one of which is calcified) unchanged from prior CT. Heart size top-normal. Hepatobiliary: Taking into account limitation by non contrast imaging, no worrisome hepatic lesion. No calcified gallstone. Pancreas: Taking into account limitation by non contrast imaging, no pancreatic mass or inflammation Spleen: Taking into account limitation by non contrast imaging, no splenic mass or enlargement. Adrenals/Urinary Tract: 3 mm distal right ureteral partially obstructing stone located 2 cm proximal to the right ureterovesical junction with mild right hydroureteronephrosis. Nonobstructing right upper pole 5 mm stone and right lower pole 2 mm stone. Left lower pole 5 mm and 2 mm nonobstructing stones. Taking into account limitation by non contrast imaging, no worrisome renal mass or adrenal lesion. Partially contracted urinary bladder with mild circumferential thickening. Stomach/Bowel: Descending colon and sigmoid colon diverticulosis without findings of diverticulitis. Vascular/Lymphatic: Trace aortic calcifications without abdominal aortic aneurysm. Left common iliac artery lymph node which short axis dimension of 7.6 mm unchanged. Reproductive: Post hysterectomy. Left ovary with  several cysts largest 4.9 mm. This can be assessed with follow-up pelvic sonogram in 6-12 weeks. Other: No bowel containing hernia. Musculoskeletal: No worrisome osseous abnormality. IMPRESSION: 3 mm distal right ureteral partially obstructing stone located 2 cm proximal to the right ureterovesical junction with mild right hydroureteronephrosis. Bilateral nonobstructing renal calculi otherwise noted. Descending colon and sigmoid colon diverticulosis. Trace aortic calcifications. Post hysterectomy. Left ovary with several cysts largest 4.9 mm. This can be assessed with follow-up pelvic sonogram in 6-12 weeks. Electronically Signed   By: Genia Del M.D.   On: 11/14/2016 19:48    ____________________________________________   PROCEDURES  Procedure(s) performed:   Procedures  None ____________________________________________   INITIAL IMPRESSION / ASSESSMENT AND PLAN / ED COURSE  Pertinent labs & imaging results that were available during my care of the patient were reviewed by me and considered in my medical decision making (see chart for details).  Patient resents to the emergency department for evaluation of right flank pain that was sudden in onset. Abdomen is soft and nontender to palpation diffusely. Suspect kidney stone clinically. His been many years since her last stone so plan for non-contract CT for further evaluation.   08:03 PM Patient is pain-free. CT scan reviewed with patient as above. I also discussed the finding of ovarian cyst and recommended follow-up ultrasound. This was also written on discharge instructions. Patient referred to urology.   At this time, I do not  feel there is any life-threatening condition present. I have reviewed and discussed all results (EKG, imaging, lab, urine as appropriate), exam findings with patient. I have reviewed nursing notes and appropriate previous records.  I feel the patient is safe to be discharged home without further emergent workup.  Discussed usual and customary return precautions. Patient and family (if present) verbalize understanding and are comfortable with this plan.  Patient will follow-up with their primary care provider. If they do not have a primary care provider, information for follow-up has been provided to them. All questions have been answered.  ____________________________________________  FINAL CLINICAL IMPRESSION(S) / ED DIAGNOSES  Final diagnoses:  Kidney stone  Cyst of ovary, unspecified laterality     MEDICATIONS GIVEN DURING THIS VISIT:  Medications  ketorolac (TORADOL) 30 MG/ML injection 30 mg (30 mg Intravenous Given 11/14/16 1748)  ondansetron (ZOFRAN) injection 4 mg (4 mg Intravenous Given 11/14/16 1748)     NEW OUTPATIENT MEDICATIONS STARTED DURING THIS VISIT:  New Prescriptions   IBUPROFEN (ADVIL,MOTRIN) 800 MG TABLET    Take 1 tablet (800 mg total) by mouth every 8 (eight) hours as needed.   ONDANSETRON (ZOFRAN ODT) 4 MG DISINTEGRATING TABLET    Take 1 tablet (4 mg total) by mouth every 8 (eight) hours as needed for nausea or vomiting.   OXYCODONE-ACETAMINOPHEN (PERCOCET/ROXICET) 5-325 MG TABLET    Take 1 tablet by mouth every 6 (six) hours as needed for severe pain.   TAMSULOSIN (FLOMAX) 0.4 MG CAPS CAPSULE    Take 1 capsule (0.4 mg total) by mouth daily.    Note:  This document was prepared using Dragon voice recognition software and may include unintentional dictation errors.  Nanda Quinton, MD Emergency Medicine    , [facility], MD 11/14/16 2003

## 2016-11-14 NOTE — Discharge Instructions (Signed)
You have been seen in the Emergency Department (ED) today for pain that we believe based on your workup, is caused by kidney stones.  As we have discussed, please drink plenty of fluids.  Please make a follow up appointment with the physician(s) listed elsewhere in this documentation.  You also have an ovarian cyst seen on CT that will need a follow up ultrasound through either your PCP or OB/Gyn in the coming 6-12 weeks.   You may take pain medication as needed but ONLY as prescribed.  Please also take your prescribed Flomax daily.  We also recommend that you take over-the-counter ibuprofen regularly according to label instructions over the next 5 days.  Take it with meals to minimize stomach discomfort.  Please see your doctor as soon as possible as stones may take 1-3 weeks to pass and you may require additional care or medications.  Do not drink alcohol, drive or participate in any other potentially dangerous activities while taking opiate pain medication as it may make you sleepy. Do not take this medication with any other sedating medications, either prescription or over-the-counter. If you were prescribed Percocet or Vicodin, do not take these with acetaminophen (Tylenol) as it is already contained within these medications.   Take Percocet as needed for severe pain.  This medication is an opiate (or narcotic) pain medication and can be habit forming.  Use it as little as possible to achieve adequate pain control.  Do not use or use it with extreme caution if you have a history of opiate abuse or dependence.  If you are on a pain contract with your primary care doctor or a pain specialist, be sure to let them know you were prescribed this medication today from the Emergency Department.  This medication is intended for your use only - do not give any to anyone else and keep it in a secure place where nobody else, especially children, have access to it.  It will also cause or worsen constipation, so  you may want to consider taking an over-the-counter stool softener while you are taking this medication.  Return to the Emergency Department (ED) or call your doctor if you have any worsening pain, fever, painful urination, are unable to urinate, or develop other symptoms that concern you.    Kidney Stones Kidney stones (urolithiasis) are deposits that form inside your kidneys. The intense pain is caused by the stone moving through the urinary tract. When the stone moves, the ureter goes into spasm around the stone. The stone is usually passed in the urine.  CAUSES   A disorder that makes certain neck glands produce too much parathyroid hormone (primary hyperparathyroidism).  A buildup of uric acid crystals, similar to gout in your joints.  Narrowing (stricture) of the ureter.  A kidney obstruction present at birth (congenital obstruction).  Previous surgery on the kidney or ureters.  Numerous kidney infections. SYMPTOMS   Feeling sick to your stomach (nauseous).  Throwing up (vomiting).  Blood in the urine (hematuria).  Pain that usually spreads (radiates) to the groin.  Frequency or urgency of urination. DIAGNOSIS   Taking a history and physical exam.  Blood or urine tests.  CT scan.  Occasionally, an examination of the inside of the urinary bladder (cystoscopy) is performed. TREATMENT   Observation.  Increasing your fluid intake.  Extracorporeal shock wave lithotripsy--This is a noninvasive procedure that uses shock waves to break up kidney stones.  Surgery may be needed if you have severe pain or  persistent obstruction. There are various surgical procedures. Most of the procedures are performed with the use of small instruments. Only small incisions are needed to accommodate these instruments, so recovery time is minimized. The size, location, and chemical composition are all important variables that will determine the proper choice of action for you. Talk to  your health care provider to better understand your situation so that you will minimize the risk of injury to yourself and your kidney.  HOME CARE INSTRUCTIONS   Drink enough water and fluids to keep your urine clear or pale yellow. This will help you to pass the stone or stone fragments.  Strain all urine through the provided strainer. Keep all particulate matter and stones for your health care provider to see. The stone causing the pain may be as small as a grain of salt. It is very important to use the strainer each and every time you pass your urine. The collection of your stone will allow your health care provider to analyze it and verify that a stone has actually passed. The stone analysis will often identify what you can do to reduce the incidence of recurrences.  Only take over-the-counter or prescription medicines for pain, discomfort, or fever as directed by your health care provider.  Keep all follow-up visits as told by your health care provider. This is important.  Get follow-up X-rays if required. The absence of pain does not always mean that the stone has passed. It may have only stopped moving. If the urine remains completely obstructed, it can cause loss of kidney function or even complete destruction of the kidney. It is your responsibility to make sure X-rays and follow-ups are completed. Ultrasounds of the kidney can show blockages and the status of the kidney. Ultrasounds are not associated with any radiation and can be performed easily in a matter of minutes.  Make changes to your daily diet as told by your health care provider. You may be told to:  Limit the amount of salt that you eat.  Eat 5 or more servings of fruits and vegetables each day.  Limit the amount of meat, poultry, fish, and eggs that you eat.  Collect a 24-hour urine sample as told by your health care provider. You may need to collect another urine sample every 6-12 months. SEEK MEDICAL CARE IF:  You  experience pain that is progressive and unresponsive to any pain medicine you have been prescribed. SEEK IMMEDIATE MEDICAL CARE IF:   Pain cannot be controlled with the prescribed medicine.  You have a fever or shaking chills.  The severity or intensity of pain increases over 18 hours and is not relieved by pain medicine.  You develop a new onset of abdominal pain.  You feel faint or pass out.  You are unable to urinate.   This information is not intended to replace advice given to you by your health care provider. Make sure you discuss any questions you have with your health care provider.   Document Released: 01/30/2005 Document Revised: 10/21/2014 Document Reviewed: 07/03/2012 Elsevier Interactive Patient Education [employer].

## 2016-11-16 LAB — URINE CULTURE: SPECIAL REQUESTS: NORMAL

## 2016-12-15 DIAGNOSIS — N202 Calculus of kidney with calculus of ureter: Secondary | ICD-10-CM | POA: Diagnosis not present

## 2016-12-21 DIAGNOSIS — Z23 Encounter for immunization: Secondary | ICD-10-CM | POA: Diagnosis not present

## 2016-12-21 DIAGNOSIS — N83202 Unspecified ovarian cyst, left side: Secondary | ICD-10-CM | POA: Diagnosis not present

## 2016-12-21 DIAGNOSIS — N200 Calculus of kidney: Secondary | ICD-10-CM | POA: Diagnosis not present

## 2017-01-09 ENCOUNTER — Other Ambulatory Visit (HOSPITAL_COMMUNITY): Payer: Self-pay | Admitting: Internal Medicine

## 2017-01-09 DIAGNOSIS — N83202 Unspecified ovarian cyst, left side: Secondary | ICD-10-CM

## 2017-01-26 ENCOUNTER — Ambulatory Visit (HOSPITAL_COMMUNITY)
Admission: RE | Admit: 2017-01-26 | Discharge: 2017-01-26 | Disposition: A | Discharge status: Home / Self Care | Payer: BLUE CROSS/BLUE SHIELD | Source: Ambulatory Visit | Attending: Internal Medicine | Admitting: Internal Medicine

## 2017-01-26 DIAGNOSIS — N83202 Unspecified ovarian cyst, left side: Secondary | ICD-10-CM | POA: Insufficient documentation

## 2017-01-26 DIAGNOSIS — Z90710 Acquired absence of both cervix and uterus: Secondary | ICD-10-CM | POA: Insufficient documentation

## 2017-01-31 DIAGNOSIS — A630 Anogenital (venereal) warts: Secondary | ICD-10-CM | POA: Diagnosis not present

## 2017-02-15 DIAGNOSIS — Z01419 Encounter for gynecological examination (general) (routine) without abnormal findings: Secondary | ICD-10-CM | POA: Diagnosis not present

## 2017-02-15 DIAGNOSIS — Z6841 Body Mass Index (BMI) 40.0 and over, adult: Secondary | ICD-10-CM | POA: Diagnosis not present

## 2017-08-10 ENCOUNTER — Other Ambulatory Visit: Payer: Self-pay

## 2017-08-10 ENCOUNTER — Emergency Department (HOSPITAL_COMMUNITY)
Admission: EM | Admit: 2017-08-10 | Discharge: 2017-08-11 | Disposition: A | Discharge status: Home / Self Care | Payer: BLUE CROSS/BLUE SHIELD | Attending: Emergency Medicine | Admitting: Emergency Medicine

## 2017-08-10 DIAGNOSIS — I10 Essential (primary) hypertension: Secondary | ICD-10-CM | POA: Insufficient documentation

## 2017-08-10 DIAGNOSIS — B029 Zoster without complications: Secondary | ICD-10-CM | POA: Diagnosis not present

## 2017-08-10 DIAGNOSIS — Z79899 Other long term (current) drug therapy: Secondary | ICD-10-CM | POA: Insufficient documentation

## 2017-08-10 DIAGNOSIS — R21 Rash and other nonspecific skin eruption: Secondary | ICD-10-CM | POA: Diagnosis not present

## 2017-08-10 NOTE — ED Provider Notes (Signed)
[facility] Provider Note   CSN: [id number] Arrival date & time: 08/10/17  2248     History   Chief Complaint Chief Complaint  Patient presents with  . Rash    HPI Sonya Kline is a 47 y.o. female.  Patient is a 47 year old female who presents to the emergency department with a rash.  The patient states she has had this rash for the last 3 or 4 days.  Is getting progressively worse.  It is been getting progressively more painful.  Occasionally it feels itchy, most of time it feels painful.  Patient states this feels very similar to when she had shingles in the past.  She has not had any recent fever or chills.  She has not had any recent operations or procedures.  The rash is only on the left buttocks area.  She presents now for assistance with this issue.  The history is provided by the patient.    Past Medical History:  Diagnosis Date  . HTN (hypertension)     Patient Active Problem List   Diagnosis Date Noted  . Labral tear of hip, degenerative 10/16/2013  . Ankle pain, left 05/03/2011  . DIVERTICULITIS, HX OF 03/09/2009    Past Surgical History:  Procedure Laterality Date  . ABDOMINAL HYSTERECTOMY    . CESAREAN SECTION     x 2     OB History    Gravida  4   Para  2   Term  2   Preterm      AB  2   Living  2     SAB  2   TAB      Ectopic      Multiple      Live Births               Home Medications    Prior to Admission medications   Medication Sig Start Date End Date Taking? Authorizing Provider  amLODipine (NORVASC) 10 MG tablet Take 10 mg by mouth daily.     [provider]  ibuprofen (ADVIL,MOTRIN) 800 MG tablet Take 1 tablet (800 mg total) by mouth every 8 (eight) hours as needed. 11/14/16   Long, Wonda Olds, MD  ondansetron (ZOFRAN ODT) 4 MG disintegrating tablet Take 1 tablet (4 mg total) by mouth every 8 (eight) hours as needed for nausea or vomiting. 11/14/16   Long, Wonda Olds, MD    oxyCODONE-acetaminophen (PERCOCET/ROXICET) 5-325 MG tablet Take 1 tablet by mouth every 6 (six) hours as needed for severe pain. 11/14/16   Long, Wonda Olds, MD  tamsulosin (FLOMAX) 0.4 MG CAPS capsule Take 1 capsule (0.4 mg total) by mouth daily. 11/14/16   Long, Wonda Olds, MD    Family History No family history on file.  Social History Social History   Tobacco Use  . Smoking status: Never Smoker  . Smokeless tobacco: Never Used  Substance Use Topics  . Alcohol use: No  . Drug use: No     Allergies   Contrast media [iodinated diagnostic agents] and Prednisone   Review of Systems Review of Systems  Constitutional: Negative for activity change.       All ROS Neg except as noted in HPI  HENT: Negative for nosebleeds.   Eyes: Negative for photophobia and discharge.  Respiratory: Negative for cough, shortness of breath and wheezing.   Cardiovascular: Negative for chest pain and palpitations.  Gastrointestinal: Negative for abdominal pain and blood in stool.  Genitourinary: Negative  for dysuria, frequency and hematuria.  Musculoskeletal: Negative for arthralgias, back pain and neck pain.  Skin: Positive for rash.  Neurological: Negative for dizziness, seizures and speech difficulty.  Psychiatric/Behavioral: Negative for confusion and hallucinations.     Physical Exam Updated Vital Signs BP (!) 114/91 (BP Location: Right Arm)   Pulse 93   Temp 98.7 F (37.1 C) (Oral)   Resp 18   Ht 5\' 2"  (1.575 m)   Wt 92.5 kg (204 lb)   LMP 11/14/2011   SpO2 96%   BMI 37.31 kg/m   Physical Exam  Constitutional: She is oriented to person, place, and time. She appears well-developed and well-nourished.  Non-toxic appearance.  HENT:  Head: Normocephalic.  Right Ear: Tympanic membrane and external ear normal.  Left Ear: Tympanic membrane and external ear normal.  Eyes: Pupils are equal, round, and reactive to light. EOM and lids are normal.  Neck: Normal range of motion. Neck supple.  Carotid bruit is not present.  Cardiovascular: Normal rate, regular rhythm, normal heart sounds, intact distal pulses and normal pulses.  Pulmonary/Chest: Breath sounds normal. No respiratory distress.  Abdominal: Soft. Bowel sounds are normal. There is no tenderness. There is no guarding.  Musculoskeletal: Normal range of motion.  Lymphadenopathy:       Head (right side): No submandibular adenopathy present.       Head (left side): No submandibular adenopathy present.    She has no cervical adenopathy.  Neurological: She is alert and oriented to person, place, and time. She has normal strength. No cranial nerve deficit or sensory deficit.  Skin: Skin is warm and dry.  Patient has a rash on the left buttocks that has a red base, it has blister and has a cluster to it seems to be following a dermatome.  Psychiatric: She has a normal mood and affect. Her speech is normal.  Nursing note and vitals reviewed.    ED Treatments / Results  Labs (all labs ordered are listed, but only abnormal results are displayed) Labs Reviewed - No data to display  EKG None  Radiology No results found.  Procedures Procedures (including critical care time)  Medications Ordered in ED Medications - No data to display   Initial Impression / Assessment and Plan / ED Course  I have reviewed the triage vital signs and the nursing notes.  Pertinent labs & imaging results that were available during my care of the patient were reviewed by me and considered in my medical decision making (see chart for details).       Final Clinical Impressions(s) / ED Diagnoses MDM  Vital signs reviewed.  The examination is consistent with shingles outbreak.  It is on the left buttocks.  There are no other areas involved at this time.  The patient will be treated with Valtrex, Percocet, Neurontin, and ibuprofen.  Patient is to follow-up with Dr. Willey Blade in the office.  The patient will return to the emergency department  immediately if any changes in condition, problems, or concerns.  The patient has been not worn to keep her distance from others, he particularly those who have not had chickenpox, or who could possibly be pregnant.  Patient is in agreement with this plan.   Final diagnoses:  Herpes zoster without complication    ED Discharge Orders        Ordered    valACYclovir (VALTREX) 1000 MG tablet  3 times daily     08/11/17 0005    oxyCODONE-acetaminophen (PERCOCET/ROXICET) 5-325 MG  tablet  Every 6 hours PRN     08/11/17 0005    gabapentin (NEURONTIN) 100 MG capsule  3 times daily     08/11/17 0005    ibuprofen (ADVIL,MOTRIN) 600 MG tablet  3 times daily     08/11/17 0005       Lily Kocher, PA-C 08/11/17 0016    Orpah Greek, MD 08/11/17 [phone]

## 2017-08-10 NOTE — ED Triage Notes (Signed)
Pt with rash to the left buttocks area, states feels like when she had shingles the last time (painful and itchy). Also c/o sore throat. Symptoms ongoing for several days

## 2017-08-11 MED ORDER — ACYCLOVIR 800 MG PO TABS
800.0000 mg | ORAL_TABLET | Freq: Once | ORAL | Status: AC
  Administered 2017-08-11: 800 mg via ORAL
  Filled 2017-08-11: qty 1

## 2017-08-11 MED ORDER — GABAPENTIN 100 MG PO CAPS: 100.0000 mg | ORAL_CAPSULE | Freq: Three times a day (TID) | ORAL | 0 refills | Status: DC

## 2017-08-11 MED ORDER — IBUPROFEN 800 MG PO TABS
800.0000 mg | ORAL_TABLET | Freq: Once | ORAL | Status: AC
  Administered 2017-08-11: 800 mg via ORAL
  Filled 2017-08-11: qty 1

## 2017-08-11 MED ORDER — OXYCODONE-ACETAMINOPHEN 5-325 MG PO TABS: 1.0000 | ORAL_TABLET | Freq: Four times a day (QID) | ORAL | 0 refills | Status: DC | PRN

## 2017-08-11 MED ORDER — OXYCODONE-ACETAMINOPHEN 5-325 MG PO TABS
2.0000 | ORAL_TABLET | Freq: Once | ORAL | Status: AC
  Administered 2017-08-11: 2 via ORAL
  Filled 2017-08-11: qty 2

## 2017-08-11 MED ORDER — IBUPROFEN 600 MG PO TABS: 600.0000 mg | ORAL_TABLET | Freq: Three times a day (TID) | ORAL | 0 refills | Status: DC

## 2017-08-11 MED ORDER — VALACYCLOVIR HCL 1 G PO TABS: 1000.0000 mg | ORAL_TABLET | Freq: Three times a day (TID) | ORAL | 0 refills | Status: DC

## 2017-08-11 MED ORDER — ONDANSETRON HCL 4 MG PO TABS
4.0000 mg | ORAL_TABLET | Freq: Once | ORAL | Status: AC
  Administered 2017-08-11: 4 mg via ORAL
  Filled 2017-08-11: qty 1

## 2017-08-11 NOTE — Discharge Instructions (Addendum)
Please keep your distance from anyone who has not had chickenpox, or could potentially be pregnant.  Please use Neurontin and ibuprofen 3 times daily, use Valtrex 3 times daily, use Percocet every 6 hours as needed for more severe pain.  Please see Dr. Willey Blade this week as soon as possible for follow-up of your shingles.

## 2018-02-25 DIAGNOSIS — Z01419 Encounter for gynecological examination (general) (routine) without abnormal findings: Secondary | ICD-10-CM | POA: Diagnosis not present

## 2018-02-25 DIAGNOSIS — Z6841 Body Mass Index (BMI) 40.0 and over, adult: Secondary | ICD-10-CM | POA: Diagnosis not present

## 2018-02-25 DIAGNOSIS — Z1151 Encounter for screening for human papillomavirus (HPV): Secondary | ICD-10-CM | POA: Diagnosis not present

## 2018-04-23 ENCOUNTER — Inpatient Hospital Stay (HOSPITAL_COMMUNITY)
Admission: EM | Admit: 2018-04-23 | Discharge: 2018-04-25 | DRG: 389 | Disposition: A | Discharge status: Home / Self Care | Payer: BLUE CROSS/BLUE SHIELD | Attending: Internal Medicine | Admitting: Internal Medicine

## 2018-04-23 ENCOUNTER — Encounter (HOSPITAL_COMMUNITY): Payer: Self-pay | Admitting: Emergency Medicine

## 2018-04-23 ENCOUNTER — Emergency Department (HOSPITAL_COMMUNITY): Payer: BLUE CROSS/BLUE SHIELD

## 2018-04-23 ENCOUNTER — Other Ambulatory Visit: Payer: Self-pay

## 2018-04-23 DIAGNOSIS — Z90710 Acquired absence of both cervix and uterus: Secondary | ICD-10-CM | POA: Diagnosis not present

## 2018-04-23 DIAGNOSIS — Z6841 Body Mass Index (BMI) 40.0 and over, adult: Secondary | ICD-10-CM | POA: Diagnosis not present

## 2018-04-23 DIAGNOSIS — N200 Calculus of kidney: Secondary | ICD-10-CM | POA: Diagnosis not present

## 2018-04-23 DIAGNOSIS — E669 Obesity, unspecified: Secondary | ICD-10-CM | POA: Diagnosis not present

## 2018-04-23 DIAGNOSIS — K8020 Calculus of gallbladder without cholecystitis without obstruction: Secondary | ICD-10-CM | POA: Diagnosis not present

## 2018-04-23 DIAGNOSIS — L918 Other hypertrophic disorders of the skin: Secondary | ICD-10-CM | POA: Diagnosis not present

## 2018-04-23 DIAGNOSIS — I10 Essential (primary) hypertension: Secondary | ICD-10-CM | POA: Diagnosis not present

## 2018-04-23 DIAGNOSIS — R1011 Right upper quadrant pain: Secondary | ICD-10-CM | POA: Diagnosis not present

## 2018-04-23 DIAGNOSIS — R1010 Upper abdominal pain, unspecified: Secondary | ICD-10-CM

## 2018-04-23 DIAGNOSIS — K567 Ileus, unspecified: Principal | ICD-10-CM | POA: Diagnosis present

## 2018-04-23 DIAGNOSIS — K56600 Partial intestinal obstruction, unspecified as to cause: Secondary | ICD-10-CM | POA: Diagnosis not present

## 2018-04-23 DIAGNOSIS — D72829 Elevated white blood cell count, unspecified: Secondary | ICD-10-CM | POA: Diagnosis not present

## 2018-04-23 DIAGNOSIS — L700 Acne vulgaris: Secondary | ICD-10-CM | POA: Diagnosis not present

## 2018-04-23 HISTORY — DX: Diverticulosis of intestine, part unspecified, without perforation or abscess without bleeding: K57.90

## 2018-04-23 LAB — COMPREHENSIVE METABOLIC PANEL
ALT: 20 U/L (ref 0–44)
ANION GAP: 9 (ref 5–15)
AST: 24 U/L (ref 15–41)
Albumin: 3.6 g/dL (ref 3.5–5.0)
Alkaline Phosphatase: 75 U/L (ref 38–126)
BUN: 16 mg/dL (ref 6–20)
CHLORIDE: 99 mmol/L (ref 98–111)
CO2: 30 mmol/L (ref 22–32)
CREATININE: 0.84 mg/dL (ref 0.44–1.00)
Calcium: 9.2 mg/dL (ref 8.9–10.3)
GFR calc non Af Amer: >60 mL/min (ref >60)
Glucose, Bld: 139 mg/dL — ABNORMAL HIGH (ref 70–99)
POTASSIUM: 4.1 mmol/L (ref 3.5–5.1)
SODIUM: 138 mmol/L (ref 135–145)
Total Bilirubin: 0.4 mg/dL (ref 0.3–1.2)
Total Protein: 7.8 g/dL (ref 6.5–8.1)

## 2018-04-23 LAB — URINALYSIS, ROUTINE W REFLEX MICROSCOPIC
Bilirubin Urine: NEGATIVE
GLUCOSE, UA: NEGATIVE mg/dL
Ketones, ur: NEGATIVE mg/dL
NITRITE: NEGATIVE
PH: 5 (ref 5.0–8.0)
Protein, ur: NEGATIVE mg/dL
Specific Gravity, Urine: 1.023 (ref 1.005–1.030)

## 2018-04-23 LAB — CBC
HEMATOCRIT: 43.9 % (ref 36.0–46.0)
HEMOGLOBIN: 13.5 g/dL (ref 12.0–15.0)
MCH: 25.9 pg — ABNORMAL LOW (ref 26.0–34.0)
MCHC: 30.8 g/dL (ref 30.0–36.0)
MCV: 84.3 fL (ref 80.0–100.0)
PLATELETS: 382 10*3/uL (ref 150–400)
RBC: 5.21 MIL/uL — AB (ref 3.87–5.11)
RDW: 15.2 % (ref 11.5–15.5)
WBC: 17.1 10*3/uL — ABNORMAL HIGH (ref 4.0–10.5)
nRBC: 0 % (ref 0.0–0.2)

## 2018-04-23 LAB — LIPASE, BLOOD: LIPASE: 32 U/L (ref 11–51)

## 2018-04-23 MED ORDER — FAMOTIDINE IN NACL 20-0.9 MG/50ML-% IV SOLN
20.0000 mg | Freq: Once | INTRAVENOUS | Status: AC
Start: 2018-04-23 — End: 2018-04-23
  Administered 2018-04-23: 20 mg via INTRAVENOUS
  Filled 2018-04-23: qty 50

## 2018-04-23 MED ORDER — ACETAMINOPHEN 650 MG RE SUPP: 650.0000 mg | Freq: Four times a day (QID) | RECTAL | Status: DC | PRN

## 2018-04-23 MED ORDER — ACETAMINOPHEN 325 MG PO TABS: 650.0000 mg | ORAL_TABLET | Freq: Four times a day (QID) | ORAL | Status: DC | PRN

## 2018-04-23 MED ORDER — HYDROMORPHONE HCL 1 MG/ML IJ SOLN
1.0000 mg | Freq: Once | INTRAMUSCULAR | Status: AC
  Administered 2018-04-23: 1 mg via INTRAVENOUS
  Filled 2018-04-23: qty 1

## 2018-04-23 MED ORDER — ENOXAPARIN SODIUM 60 MG/0.6ML ~~LOC~~ SOLN
0.5000 mg/kg | SUBCUTANEOUS | Status: DC
  Administered 2018-04-24: 55 mg via SUBCUTANEOUS
  Filled 2018-04-23 (×2): qty 0.6

## 2018-04-23 MED ORDER — ONDANSETRON HCL 4 MG/2ML IJ SOLN: 4.0000 mg | Freq: Four times a day (QID) | INTRAMUSCULAR | Status: DC | PRN

## 2018-04-23 MED ORDER — ONDANSETRON HCL 4 MG/2ML IJ SOLN
4.0000 mg | Freq: Once | INTRAMUSCULAR | Status: AC
  Administered 2018-04-23: 4 mg via INTRAVENOUS
  Filled 2018-04-23: qty 2

## 2018-04-23 MED ORDER — HYDRALAZINE HCL 20 MG/ML IJ SOLN: 10.0000 mg | Freq: Four times a day (QID) | INTRAMUSCULAR | Status: DC | PRN

## 2018-04-23 MED ORDER — ONDANSETRON HCL 4 MG PO TABS: 4.0000 mg | ORAL_TABLET | Freq: Four times a day (QID) | ORAL | Status: DC | PRN

## 2018-04-23 MED ORDER — SODIUM CHLORIDE 0.9 % IV SOLN
INTRAVENOUS | Status: AC
  Administered 2018-04-24: via INTRAVENOUS

## 2018-04-23 NOTE — ED Triage Notes (Signed)
C/o abdominal pain for last 2 days.  Rates pain 10/10.  Last BM yesterday (normal).  Denies any other issues.

## 2018-04-23 NOTE — Progress Notes (Signed)
[facility]  Called by Dr. Roderic Palau. Patient with epigastric pain and nausea. CT with gallstones but also possible ileus versus early pSBO.  No vomiting reported.  Leukocytosis but unknown etiology.  Recommend [place] RUQ In the AM Would keep NPO and monitor, do not think needs NG unless vomits Repeat labs in the AM and see what leukocytosis does with hydration   Will see in the AM.  Curlene Labrum, MD [facility] [place], [place] [address] [phone] (office)

## 2018-04-23 NOTE — H&P (Signed)
[facility] H&P    Patient Demographics:    Sonya Kline, is a 48 y.o. female  MRN: [id number]  DOB - 02/23/1970  Admit Date - 04/23/2018  Referring MD/NP/PA: Dr. Roderic Palau  Outpatient Primary MD for the patient is Asencion Noble, MD  Patient coming from: Home  Chief complaint-abdominal pain   HPI:    Sonya Kline  is a 48 y.o. female, with history of diverticulosis, hypertension came to ED with complaints of abdominal pain of 2 days duration.  She had nausea but no vomiting.  Denies diarrhea.  Last BM was yesterday.  She did not pass much gas today. Denies dizziness or blurred vision. Denies chest pain or shortness of breath. Denies fever or chills. In the ED CT scan of the abdomen pelvis revealed cholelithiasis and ileus versus SBO.  No cholecystitis General surgery was consulted, at this time general surgery recommends abdominal ultrasound in a.m.    Review of systems:    In addition to the HPI above,   All other systems reviewed and are negative.    Past History of the following :    Past Medical History:  Diagnosis Date  . Diverticulosis   . HTN (hypertension)       Past Surgical History:  Procedure Laterality Date  . ABDOMINAL HYSTERECTOMY    . CESAREAN SECTION     x 2      Social History:      Social History   Tobacco Use  . Smoking status: Never Smoker  . Smokeless tobacco: Never Used  Substance Use Topics  . Alcohol use: No       Family History :      Home Medications:   Prior to Admission medications   Medication Sig Start Date End Date Taking? Authorizing Provider  amLODipine (NORVASC) 10 MG tablet Take 10 mg by mouth daily.    Yes [provider]     Allergies:     Allergies  Allergen Reactions  . Contrast Media [Iodinated Diagnostic Agents] Hives  . Prednisone Rash     Physical Exam:   Vitals  Blood pressure 137/87, pulse 96, temperature 98.1 F  (36.7 C), temperature source Oral, resp. rate 15, height 5\' 2"  (1.575 m), weight 92.5 kg, last menstrual period 11/14/2011, SpO2 97 %.  1.  General: Appears in no acute distress  2. Psychiatric: Alert, oriented x3, intact insight and judgment  3. Neurologic: Cranial nerve II through grossly intact, motor strength 5/5 in all extremities  4. HEENMT:  Atraumatic normocephalic, oral mucosa is pink and moist  5. Respiratory : Clear to auscultation bilaterally  6. Cardiovascular : S1-S2, regular, no murmur auscultated  7. Gastrointestinal:  Abdomen is soft, positive epigastric tenderness to palpation.  Mild tenderness at right upper quadrant.     Data Review:    CBC Recent Labs  Lab 04/23/18 1712  WBC 17.1*  HGB 13.5  HCT 43.9  PLT 382  MCV 84.3  MCH 25.9*  MCHC 30.8  RDW 15.2   ------------------------------------------------------------------------------------------------------------------  Results for orders placed or  performed during the hospital encounter of 04/23/18 (from the past 48 hour(s))  Lipase, blood     Status: None   Collection Time: 04/23/18  5:12 PM  Result Value Ref Range   Lipase 32 11 - 51 U/L    Comment: Performed at [facility], [address] [place], [place] [address]  Comprehensive metabolic panel     Status: Abnormal   Collection Time: 04/23/18  5:12 PM  Result Value Ref Range   Sodium 138 135 - 145 mmol/L   Potassium 4.1 3.5 - 5.1 mmol/L   Chloride 99 98 - 111 mmol/L   CO2 30 22 - 32 mmol/L   Glucose, Bld 139 (H) 70 - 99 mg/dL   BUN 16 6 - 20 mg/dL   Creatinine, Ser 0.84 0.44 - 1.00 mg/dL   Calcium 9.2 8.9 - 10.3 mg/dL   Total Protein 7.8 6.5 - 8.1 g/dL   Albumin 3.6 3.5 - 5.0 g/dL   AST 24 15 - 41 U/L   ALT 20 0 - 44 U/L   Alkaline Phosphatase 75 38 - 126 U/L   Total Bilirubin 0.4 0.3 - 1.2 mg/dL   GFR calc non Af Amer >60 >60 mL/min   GFR calc Af Amer >60 >60 mL/min   Anion gap 9 5 - 15    Comment: Performed at [facility], [address] [place], [place] [address]  CBC     Status: Abnormal   Collection Time: 04/23/18  5:12 PM  Result Value Ref Range   WBC 17.1 (H) 4.0 - 10.5 K/uL   RBC 5.21 (H) 3.87 - 5.11 MIL/uL   Hemoglobin 13.5 12.0 - 15.0 g/dL   HCT 43.9 36.0 - 46.0 %   MCV 84.3 80.0 - 100.0 fL   MCH 25.9 (L) 26.0 - 34.0 pg   MCHC 30.8 30.0 - 36.0 g/dL   RDW 15.2 11.5 - 15.5 %   Platelets 382 150 - 400 K/uL   nRBC 0.0 0.0 - 0.2 %    Comment: Performed at [facility], [address], [place], [place] [address]    Chemistries  Recent Labs  Lab 04/23/18 1712  NA 138  K 4.1  CL 99  CO2 30  GLUCOSE 139*  BUN 16  CREATININE 0.84  CALCIUM 9.2  AST 24  ALT 20  ALKPHOS 75  BILITOT 0.4   ------------------------------------------------------------------------------------------------------------------  ------------------------------------------------------------------------------------------------------------------ GFR: Estimated Creatinine Clearance: 85.8 mL/min (by C-G formula based on SCr of 0.84 mg/dL). Liver Function Tests: Recent Labs  Lab 04/23/18 1712  AST 24  ALT 20  ALKPHOS 75  BILITOT 0.4  PROT 7.8  ALBUMIN 3.6   Recent Labs  Lab 04/23/18 1712  LIPASE 32    --------------------------------------------------------------------------------------------------------------- Urine analysis:    Component Value Date/Time   COLORURINE STRAW (A) 11/14/2016 1734   APPEARANCEUR CLEAR 11/14/2016 1734   LABSPEC 1.010 11/14/2016 1734   PHURINE 7.0 11/14/2016 1734   GLUCOSEU NEGATIVE 11/14/2016 1734   HGBUR LARGE (A) 11/14/2016 1734   BILIRUBINUR NEGATIVE 11/14/2016 1734   KETONESUR NEGATIVE 11/14/2016 1734   PROTEINUR NEGATIVE 11/14/2016 1734   UROBILINOGEN 0.2 10/17/2008 1602   NITRITE NEGATIVE 11/14/2016 1734   LEUKOCYTESUR NEGATIVE 11/14/2016 1734      Imaging Results:    Ct Abdomen Pelvis Wo Contrast  Result Date: 04/23/2018 CLINICAL DATA:  Abdominal pain for 2  days. EXAM: CT ABDOMEN AND PELVIS WITHOUT CONTRAST TECHNIQUE: Multidetector CT imaging of the abdomen and pelvis was performed following the standard  protocol without IV contrast. COMPARISON:  11/14/2016 FINDINGS: Lower chest: No acute abnormality. Hepatobiliary: Normal appearance of the liver. Cholelithiasis. No pericholecystic fluid or biliary ductal dilation. Pancreas: Unremarkable. No pancreatic ductal dilatation or surrounding inflammatory changes. Spleen: Normal in size without focal abnormality. Adrenals/Urinary Tract: Normal adrenal glands. Bilateral nonobstructive nephrolithiasis, including 6 mm calculus in the midpole region of the right kidney and 5 mm calculus in the lower pole of the left kidney. Normal ureters and urinary bladder. Stomach/Bowel: Normal appearance of the stomach. Borderline dilation of small bowel loops measuring between 2.6 and 3.5 cm. No discrete transitional point is identified. Normal appendix. Mild left colonic diverticulosis. No evidence of diverticulitis. Vascular/Lymphatic: No significant vascular findings are present. No enlarged abdominal or pelvic lymph nodes. Reproductive: Status post hysterectomy. No adnexal masses. Other: No abdominal wall hernia or abnormality. Minimal free fluid anterior to the urinary bladder. Musculoskeletal: IMPRESSION: 1. Borderline dilation of small bowel loops up to 3.5 cm, suggestive of ileus versus less likely early/incomplete small bowel obstruction. 2. Cholelithiasis without CT evidence of acute cholecystitis. 3. Bilateral nonobstructive nephrolithiasis. Electronically Signed   By: Fidela Salisbury M.D.   On: 04/23/2018 20:01      Assessment & Plan:    Active Problems:   Ileus ([place])   1. Ileus versus SBO-we will keep n.p.o., start IV normal saline at 100 ml per hour.  General surgery has been consulted and will see patient in a.m.  2. Epigastric /RUQ pain-we will obtain abdominal ultrasound in a.m.  Keep n.p.o.  General surgery  to evaluate.  CT abdomen showed no cholecystitis.  3. Leukocytosis-patient has leukocytosis with WBC 17,000, likely reactive.  Patient is afebrile.  Will follow UA, abdominal ultrasound.  4. Hypertension-hold amlodipine, will start IV hydralazine 10 mg every 6 hours as needed for BP greater than 160/100.    DVT Prophylaxis-   Lovenox   AM Labs Ordered, also please review Full Orders  Family Communication: Admission, patients condition and plan of care including tests being ordered have been discussed with the patient  who indicate understanding and agree with the plan and Code Status.  Code Status: Full code  Admission status: Inpatient: Based on patients clinical presentation and evaluation of above clinical data, I have made determination that patient meets Inpatient criteria at this time.  Time spent in minutes : 60 minutes   Oswald Hillock M.D on 04/23/2018 at 10:18 PM

## 2018-04-23 NOTE — ED Provider Notes (Signed)
[facility] EMERGENCY DEPARTMENT Provider Note   CSN: [id number] Arrival date & time: 04/23/18  1611    History   Chief Complaint Chief Complaint  Patient presents with  . Abdominal Pain    HPI Sonya Kline is a 48 y.o. female.     Patient complains of abdominal pain for 2 days.  Some nausea  The history is provided by the patient. No language interpreter was used.  Abdominal Pain  Pain location:  Epigastric Pain quality: aching   Pain radiates to:  Does not radiate Pain severity:  Severe Onset quality:  Sudden Timing:  Constant Progression:  Worsening Chronicity:  New Context: not alcohol use   Relieved by:  Nothing Associated symptoms: no chest pain, no cough, no diarrhea, no fatigue and no hematuria     Past Medical History:  Diagnosis Date  . Diverticulosis   . HTN (hypertension)     Patient Active Problem List   Diagnosis Date Noted  . Labral tear of hip, degenerative 10/16/2013  . Ankle pain, left 05/03/2011  . DIVERTICULITIS, HX OF 03/09/2009    Past Surgical History:  Procedure Laterality Date  . ABDOMINAL HYSTERECTOMY    . CESAREAN SECTION     x 2     OB History    Gravida  4   Para  2   Term  2   Preterm      AB  2   Living  2     SAB  2   TAB      Ectopic      Multiple      Live Births               Home Medications    Prior to Admission medications   Medication Sig Start Date End Date Taking? Authorizing Provider  amLODipine (NORVASC) 10 MG tablet Take 10 mg by mouth daily.    Yes [provider]    Family History History reviewed. No pertinent family history.  Social History Social History   Tobacco Use  . Smoking status: Never Smoker  . Smokeless tobacco: Never Used  Substance Use Topics  . Alcohol use: No  . Drug use: No     Allergies   Contrast media [iodinated diagnostic agents] and Prednisone   Review of Systems Review of Systems  Constitutional: Negative for appetite change  and fatigue.  HENT: Negative for congestion, ear discharge and sinus pressure.   Eyes: Negative for discharge.  Respiratory: Negative for cough.   Cardiovascular: Negative for chest pain.  Gastrointestinal: Positive for abdominal pain. Negative for diarrhea.  Genitourinary: Negative for frequency and hematuria.  Musculoskeletal: Negative for back pain.  Skin: Negative for rash.  Neurological: Negative for seizures and headaches.  Psychiatric/Behavioral: Negative for hallucinations.     Physical Exam Updated Vital Signs BP 137/87   Pulse 96   Temp 98.1 F (36.7 C) (Oral)   Resp 15   Ht 5\' 2"  (1.575 m)   Wt 92.5 kg   LMP 11/14/2011   SpO2 97%   BMI 37.31 kg/m   Physical Exam Vitals signs reviewed.  Constitutional:      Appearance: She is well-developed.  HENT:     Head: Normocephalic.     Nose: Nose normal.  Eyes:     General: No scleral icterus.    Conjunctiva/sclera: Conjunctivae normal.  Neck:     Musculoskeletal: Neck supple.     Thyroid: No thyromegaly.  Cardiovascular:  Rate and Rhythm: Normal rate and regular rhythm.     Heart sounds: No murmur. No friction rub. No gallop.   Pulmonary:     Breath sounds: No stridor. No wheezing or rales.  Chest:     Chest wall: No tenderness.  Abdominal:     General: There is no distension.     Tenderness: There is abdominal tenderness. There is no rebound.  Musculoskeletal: Normal range of motion.  Lymphadenopathy:     Cervical: No cervical adenopathy.  Skin:    Findings: No erythema or rash.  Neurological:     Mental Status: She is alert and oriented to person, place, and time.     Motor: No abnormal muscle tone.     Coordination: Coordination normal.  Psychiatric:        Behavior: Behavior normal.      ED Treatments / Results  Labs (all labs ordered are listed, but only abnormal results are displayed) Labs Reviewed  COMPREHENSIVE METABOLIC PANEL - Abnormal; Notable for the following components:       Result Value   Glucose, Bld 139 (*)    All other components within normal limits  CBC - Abnormal; Notable for the following components:   WBC 17.1 (*)    RBC 5.21 (*)    MCH 25.9 (*)    All other components within normal limits  LIPASE, BLOOD  URINALYSIS, ROUTINE W REFLEX MICROSCOPIC    EKG None  Radiology Ct Abdomen Pelvis Wo Contrast  Result Date: 04/23/2018 CLINICAL DATA:  Abdominal pain for 2 days. EXAM: CT ABDOMEN AND PELVIS WITHOUT CONTRAST TECHNIQUE: Multidetector CT imaging of the abdomen and pelvis was performed following the standard protocol without IV contrast. COMPARISON:  11/14/2016 FINDINGS: Lower chest: No acute abnormality. Hepatobiliary: Normal appearance of the liver. Cholelithiasis. No pericholecystic fluid or biliary ductal dilation. Pancreas: Unremarkable. No pancreatic ductal dilatation or surrounding inflammatory changes. Spleen: Normal in size without focal abnormality. Adrenals/Urinary Tract: Normal adrenal glands. Bilateral nonobstructive nephrolithiasis, including 6 mm calculus in the midpole region of the right kidney and 5 mm calculus in the lower pole of the left kidney. Normal ureters and urinary bladder. Stomach/Bowel: Normal appearance of the stomach. Borderline dilation of small bowel loops measuring between 2.6 and 3.5 cm. No discrete transitional point is identified. Normal appendix. Mild left colonic diverticulosis. No evidence of diverticulitis. Vascular/Lymphatic: No significant vascular findings are present. No enlarged abdominal or pelvic lymph nodes. Reproductive: Status post hysterectomy. No adnexal masses. Other: No abdominal wall hernia or abnormality. Minimal free fluid anterior to the urinary bladder. Musculoskeletal: IMPRESSION: 1. Borderline dilation of small bowel loops up to 3.5 cm, suggestive of ileus versus less likely early/incomplete small bowel obstruction. 2. Cholelithiasis without CT evidence of acute cholecystitis. 3. Bilateral  nonobstructive nephrolithiasis. Electronically Signed   By: Fidela Salisbury M.D.   On: 04/23/2018 20:01    Procedures Procedures (including critical care time)  Medications Ordered in ED Medications  famotidine (PEPCID) IVPB 20 mg premix (0 mg Intravenous Stopped 04/23/18 1931)  ondansetron (ZOFRAN) injection 4 mg (4 mg Intravenous Given 04/23/18 1858)  HYDROmorphone (DILAUDID) injection 1 mg (1 mg Intravenous Given 04/23/18 1902)     Initial Impression / Assessment and Plan / ED Course  I have reviewed the triage vital signs and the nursing notes.  Pertinent labs & imaging results that were available during my care of the patient were reviewed by me and considered in my medical decision making (see chart for details).  With persistent epigastric abdominal pain with elevated white count and positive gallstones with possible early small bowel obstruction.  I spoke with Dr. Constance Haw general surgery and we will have medicine admit the patient and she will get an ultrasound tomorrow with surgery consult  Final Clinical Impressions(s) / ED Diagnoses   Final diagnoses:  Pain of upper abdomen    ED Discharge Orders    None       Milton Ferguson, MD 04/23/18 2120

## 2018-04-23 NOTE — ED Notes (Signed)
Checked on pt for urine sample,pt can't go right now,will try back in 30 minutes. 

## 2018-04-24 ENCOUNTER — Inpatient Hospital Stay (HOSPITAL_COMMUNITY): Payer: BLUE CROSS/BLUE SHIELD

## 2018-04-24 DIAGNOSIS — R1010 Upper abdominal pain, unspecified: Secondary | ICD-10-CM

## 2018-04-24 DIAGNOSIS — K567 Ileus, unspecified: Principal | ICD-10-CM

## 2018-04-24 LAB — CBC
HCT: 39.5 % (ref 36.0–46.0)
Hemoglobin: 11.9 g/dL — ABNORMAL LOW (ref 12.0–15.0)
MCH: 25.7 pg — ABNORMAL LOW (ref 26.0–34.0)
MCHC: 30.1 g/dL (ref 30.0–36.0)
MCV: 85.3 fL (ref 80.0–100.0)
Platelets: 361 10*3/uL (ref 150–400)
RBC: 4.63 MIL/uL (ref 3.87–5.11)
RDW: 15.4 % (ref 11.5–15.5)
WBC: 20.5 10*3/uL — ABNORMAL HIGH (ref 4.0–10.5)
nRBC: 0 % (ref 0.0–0.2)

## 2018-04-24 LAB — COMPREHENSIVE METABOLIC PANEL
ALBUMIN: 3.1 g/dL — AB (ref 3.5–5.0)
ALT: 18 U/L (ref 0–44)
AST: 20 U/L (ref 15–41)
Alkaline Phosphatase: 65 U/L (ref 38–126)
Anion gap: 8 (ref 5–15)
BUN: 16 mg/dL (ref 6–20)
CO2: 27 mmol/L (ref 22–32)
Calcium: 8.7 mg/dL — ABNORMAL LOW (ref 8.9–10.3)
Chloride: 104 mmol/L (ref 98–111)
Creatinine, Ser: 0.68 mg/dL (ref 0.44–1.00)
GFR calc Af Amer: >60 mL/min (ref >60)
GFR calc non Af Amer: >60 mL/min (ref >60)
Glucose, Bld: 132 mg/dL — ABNORMAL HIGH (ref 70–99)
Potassium: 3.7 mmol/L (ref 3.5–5.1)
SODIUM: 139 mmol/L (ref 135–145)
Total Bilirubin: 0.5 mg/dL (ref 0.3–1.2)
Total Protein: 6.8 g/dL (ref 6.5–8.1)

## 2018-04-24 LAB — MAGNESIUM: MAGNESIUM: 1.9 mg/dL (ref 1.7–2.4)

## 2018-04-24 MED ORDER — AMLODIPINE BESYLATE 5 MG PO TABS
10.0000 mg | ORAL_TABLET | Freq: Every day | ORAL | Status: DC
  Administered 2018-04-24 – 2018-04-25 (×2): 10 mg via ORAL
  Filled 2018-04-24 (×2): qty 2

## 2018-04-24 NOTE — Plan of Care (Signed)

## 2018-04-24 NOTE — Consult Note (Signed)
Reason for Consult: epigastric pain Referring Physician: Heath Lark DO, Milton Ferguson MD   HPI: Sonya Kline is an 48 y.o. female who presents with 2 days of stabbing epigastric pain and nausea. Patient reported worsening of her pain on day 2, which prompted her to seek medical care. She is unable to identify any factors attributing to onset, denying any association with eating or positional changes. At it's worst, patient rates her pain at 10/10. Patient denies alleviating or aggravating factors. No treatment at home. Patient denies history of similar pain, denies person history of gall bladder disease, indigestion, or alcohol use disorder. Patient denies any recent illnesses.   Past Medical History:  Diagnosis Date  . Diverticulosis   . HTN (hypertension)     Past Surgical History:  Procedure Laterality Date  . ABDOMINAL HYSTERECTOMY    . CESAREAN SECTION     x 2    History reviewed. No pertinent family history.  Social History:  reports that she has never smoked. She has never used smokeless tobacco. She reports that she does not drink alcohol or use drugs.  Allergies:  Allergies  Allergen Reactions  . Contrast Media [Iodinated Diagnostic Agents] Hives  . Prednisone Rash    Medications:  I have reviewed the patient's current medications. Prior to Admission:  Medications Prior to Admission  Medication Sig Dispense Refill Last Dose  . amLODipine (NORVASC) 10 MG tablet Take 10 mg by mouth daily.    04/23/2018 at Unknown time   Scheduled: . amLODipine  10 mg Oral Daily  . enoxaparin (LOVENOX) injection  0.5 mg/kg Subcutaneous Q24H   Continuous:  [id number] **OR** acetaminophen, hydrALAZINE, ondansetron **OR** ondansetron (ZOFRAN) IV Anti-infectives (From admission, onward)   None      Results for orders placed or performed during the hospital encounter of 04/23/18 (from the past 48 hour(s))  Lipase, blood     Status: None   Collection Time: 04/23/18  5:12 PM   Result Value Ref Range   Lipase 32 11 - 51 U/L    Comment: Performed at [facility], [address] [place], [place] [address]  Comprehensive metabolic panel     Status: Abnormal   Collection Time: 04/23/18  5:12 PM  Result Value Ref Range   Sodium 138 135 - 145 mmol/L   Potassium 4.1 3.5 - 5.1 mmol/L   Chloride 99 98 - 111 mmol/L   CO2 30 22 - 32 mmol/L   Glucose, Bld 139 (H) 70 - 99 mg/dL   BUN 16 6 - 20 mg/dL   Creatinine, Ser 0.84 0.44 - 1.00 mg/dL   Calcium 9.2 8.9 - 10.3 mg/dL   Total Protein 7.8 6.5 - 8.1 g/dL   Albumin 3.6 3.5 - 5.0 g/dL   AST 24 15 - 41 U/L   ALT 20 0 - 44 U/L   Alkaline Phosphatase 75 38 - 126 U/L   Total Bilirubin 0.4 0.3 - 1.2 mg/dL   GFR calc non Af Amer >60 >60 mL/min   GFR calc Af Amer >60 >60 mL/min   Anion gap 9 5 - 15    Comment: Performed at [facility], [address], [place], [place] [address]  CBC     Status: Abnormal   Collection Time: 04/23/18  5:12 PM  Result Value Ref Range   WBC 17.1 (H) 4.0 - 10.5 K/uL   RBC 5.21 (H) 3.87 - 5.11 MIL/uL   Hemoglobin 13.5 12.0 - 15.0 g/dL   HCT 43.9 36.0 -  46.0 %   MCV 84.3 80.0 - 100.0 fL   MCH 25.9 (L) 26.0 - 34.0 pg   MCHC 30.8 30.0 - 36.0 g/dL   RDW 15.2 11.5 - 15.5 %   Platelets 382 150 - 400 K/uL   nRBC 0.0 0.0 - 0.2 %    Comment: Performed at [facility], [address], [place], [place] [address]  Urinalysis, Routine w reflex microscopic     Status: Abnormal   Collection Time: 04/23/18 10:13 PM  Result Value Ref Range   Color, Urine YELLOW YELLOW   APPearance HAZY (A) CLEAR   Specific Gravity, Urine 1.023 1.005 - 1.030   pH 5.0 5.0 - 8.0   Glucose, UA NEGATIVE NEGATIVE mg/dL   Hgb urine dipstick LARGE (A) NEGATIVE   Bilirubin Urine NEGATIVE NEGATIVE   Ketones, ur NEGATIVE NEGATIVE mg/dL   Protein, ur NEGATIVE NEGATIVE mg/dL   Nitrite NEGATIVE NEGATIVE   Leukocytes,Ua SMALL (A) NEGATIVE   RBC / HPF 21-50 0 - 5 RBC/hpf   WBC, UA 11-20 0 - 5 WBC/hpf   Bacteria, UA RARE (A) NONE  SEEN   Squamous Epithelial / LPF 11-20 0 - 5   Mucus PRESENT    Ca Oxalate Crys, UA PRESENT     Comment: Performed at [facility], [address] [place], [place] [address]  CBC     Status: Abnormal   Collection Time: 04/24/18  4:16 AM  Result Value Ref Range   WBC 20.5 (H) 4.0 - 10.5 K/uL   RBC 4.63 3.87 - 5.11 MIL/uL   Hemoglobin 11.9 (L) 12.0 - 15.0 g/dL   HCT 39.5 36.0 - 46.0 %   MCV 85.3 80.0 - 100.0 fL   MCH 25.7 (L) 26.0 - 34.0 pg   MCHC 30.1 30.0 - 36.0 g/dL   RDW 15.4 11.5 - 15.5 %   Platelets 361 150 - 400 K/uL   nRBC 0.0 0.0 - 0.2 %    Comment: Performed at [facility], [address], [place], [place] [address]  Comprehensive metabolic panel     Status: Abnormal   Collection Time: 04/24/18  4:16 AM  Result Value Ref Range   Sodium 139 135 - 145 mmol/L   Potassium 3.7 3.5 - 5.1 mmol/L   Chloride 104 98 - 111 mmol/L   CO2 27 22 - 32 mmol/L   Glucose, Bld 132 (H) 70 - 99 mg/dL   BUN 16 6 - 20 mg/dL   Creatinine, Ser 0.68 0.44 - 1.00 mg/dL   Calcium 8.7 (L) 8.9 - 10.3 mg/dL   Total Protein 6.8 6.5 - 8.1 g/dL   Albumin 3.1 (L) 3.5 - 5.0 g/dL   AST 20 15 - 41 U/L   ALT 18 0 - 44 U/L   Alkaline Phosphatase 65 38 - 126 U/L   Total Bilirubin 0.5 0.3 - 1.2 mg/dL   GFR calc non Af Amer >60 >60 mL/min   GFR calc Af Amer >60 >60 mL/min   Anion gap 8 5 - 15    Comment: Performed at [facility], [address] [place], [place] [address]  Magnesium     Status: None   Collection Time: 04/24/18  4:16 AM  Result Value Ref Range   Magnesium 1.9 1.7 - 2.4 mg/dL    Comment: Performed at [facility], [address] [place],  [address]    Ct Abdomen Pelvis Wo Contrast  Result Date: 04/23/2018 CLINICAL DATA:  Abdominal pain for 2 days. EXAM: CT  ABDOMEN AND PELVIS WITHOUT CONTRAST TECHNIQUE: Multidetector CT imaging of the abdomen and pelvis was performed following the standard protocol without IV contrast. COMPARISON:  11/14/2016 FINDINGS: Lower chest: No acute  abnormality. Hepatobiliary: Normal appearance of the liver. Cholelithiasis. No pericholecystic fluid or biliary ductal dilation. Pancreas: Unremarkable. No pancreatic ductal dilatation or surrounding inflammatory changes. Spleen: Normal in size without focal abnormality. Adrenals/Urinary Tract: Normal adrenal glands. Bilateral nonobstructive nephrolithiasis, including 6 mm calculus in the midpole region of the right kidney and 5 mm calculus in the lower pole of the left kidney. Normal ureters and urinary bladder. Stomach/Bowel: Normal appearance of the stomach. Borderline dilation of small bowel loops measuring between 2.6 and 3.5 cm. No discrete transitional point is identified. Normal appendix. Mild left colonic diverticulosis. No evidence of diverticulitis. Vascular/Lymphatic: No significant vascular findings are present. No enlarged abdominal or pelvic lymph nodes. Reproductive: Status post hysterectomy. No adnexal masses. Other: No abdominal wall hernia or abnormality. Minimal free fluid anterior to the urinary bladder. Musculoskeletal: IMPRESSION: 1. Borderline dilation of small bowel loops up to 3.5 cm, suggestive of ileus versus less likely early/incomplete small bowel obstruction. 2. Cholelithiasis without CT evidence of acute cholecystitis. 3. Bilateral nonobstructive nephrolithiasis. Electronically Signed   By: Fidela Salisbury M.D.   On: 04/23/2018 20:01   US Abdomen Complete  Result Date: 04/24/2018 CLINICAL DATA:  Initial evaluation for acute right upper quadrant pain for 3 days, nausea. EXAM: ABDOMEN ULTRASOUND COMPLETE COMPARISON:  Prior CT from 04/23/2018. FINDINGS: Gallbladder: No gallstones or wall thickening visualized. No free pericholecystic fluid. No sonographic Murphy sign noted by sonographer. Common bile duct: Diameter: 1.9 mm Liver: No focal lesion identified. Within normal limits in parenchymal echogenicity. Portal vein is patent on color Doppler imaging with normal direction of  blood flow towards the liver. IVC: No abnormality visualized. Pancreas: Visualized portion unremarkable. Spleen: Size and appearance within normal limits. Right Kidney: Length: 11.5 cm. Echogenicity within normal limits. No mass or hydronephrosis visualized. 7 mm shadowing echogenic stone present within the interpolar right kidney. Left Kidney: Length: 11.1 cm. Echogenicity within normal limits. No mass or hydronephrosis visualized. Previously noted left renal nephrolithiasis not visualized by sonography. Abdominal aorta: No aneurysm visualized. Other findings: None. IMPRESSION: 1. No acute abnormality within the abdomen. Specifically, normal sonographic appearance of the gallbladder with no evidence for cholelithiasis, acute cholecystitis, or biliary dilatation. 2. Nonobstructive right renal nephrolithiasis. 3. Otherwise unremarkable abdominal ultrasound. Electronically Signed   By: Jeannine Boga M.D.   On: 04/24/2018 14:14    ROS:   A comprehensive review of systems was negative.  Blood pressure 127/90, pulse 85, temperature 98.2 F (36.8 C), temperature source Oral, resp. rate 18, height 5\' 2"  (1.575 m), weight 105.4 kg, last menstrual period 11/14/2011, SpO2 99 %. Physical Exam: Physical Exam Constitutional:      General: She is not in acute distress.    Appearance: She is well-developed. She is obese.  HENT:     Head: Normocephalic and atraumatic.  Eyes:     Extraocular Movements: Extraocular movements intact.  Cardiovascular:     Rate and Rhythm: Normal rate and regular rhythm.     Heart sounds: Normal heart sounds.  Pulmonary:     Effort: Pulmonary effort is normal.     Breath sounds: Normal breath sounds.  Abdominal:     General: Abdomen is protuberant. Bowel sounds are normal. There is no distension.     Palpations: Abdomen is soft.     Tenderness: There is no abdominal tenderness.  There is no guarding or rebound. Negative signs include Murphy's sign.  Skin:    General:  Skin is warm and dry.  Neurological:     Mental Status: She is alert.  Psychiatric:        Mood and Affect: Mood normal.        Behavior: Behavior normal.     Assessment: Sonya Kline is a 48 y.o. woman with past medical history of hypertension and diverticulitis who presents with worsening stabbing epigastric pain for two days prior to her admission. Patient's CBC is significant for increasing leukocytosis; her labs are otherwise within normal limits. No accompanying signs or symptoms of infection. Vitals signs have remained stable. Patient's physical exam today is benign. She endorses resolution of her epigastric pain and nausea, she has been tolerating her clear liquid diet, and reports a bowel movement last night. Patient's CT A/P reveals dilation of bowel potentially indicating an ileus or early small bowel obstruction. Abdominal US negative for cholelithiasis or cholecystitis. Currently there is no concern for complete obstruction, bowel ischemia or necrosis.   Plan: -continue to trend patient's WBC, CBC with differential -advance diet as tolerated -plans to monitor patient for evolving ileus or obstruction  Mercy Moore 04/24/2018, 3:31 PM

## 2018-04-24 NOTE — Progress Notes (Signed)
PROGRESS NOTE    Sonya Kline  GEX:[id number] DOB: 1970-09-21 DOA: 04/23/2018 PCP: Asencion Noble, MD   Brief Narrative:  Per HPI:  Sonya Kline  is a 48 y.o. female, with history of diverticulosis, hypertension came to ED with complaints of abdominal pain of 2 days duration.  She had nausea but no vomiting.  Denies diarrhea.  Last BM was yesterday.  She did not pass much gas today. Denies dizziness or blurred vision. Denies chest pain or shortness of breath. Denies fever or chills. In the ED CT scan of the abdomen pelvis revealed cholelithiasis and ileus versus SBO.  No cholecystitis General surgery was consulted, at this time general surgery recommends abdominal ultrasound in a.m.  Patient was admitted with ileus versus SBO, but appears to be improving this morning.  Assessment & Plan:   Active Problems:   Ileus ([place])   1. Ileus versus SBO.  Start on clear liquid diet and maintain on IV fluid for now.  Recheck a.m. labs and check magnesium.  Appreciate general surgery consultation. 2. Right upper quadrant abdominal pain.  Ultrasound pending.  CT abdomen with no findings of cholecystitis.  Appreciate general surgery evaluation. 3. Leukocytosis likely secondary to above.  Will follow repeat CBC.  No anticipated need for antibiotics at this time. 4. Hypertension.  Restart home amlodipine and follow blood pressures.  IV hydralazine ordered as needed.   DVT prophylaxis: Lovenox Code Status: Full Family Communication: None at bedside Disposition Plan: Continue diet advancement as tolerated and maintain on IV fluid.  Appreciate general surgery consultation.  Anticipate discharge if improved in the next 24 to 48 hours.   Consultants:   General surgery  Procedures:   None  Antimicrobials:   None   Subjective: Patient seen and evaluated today with no new acute complaints or concerns. No acute concerns or events noted overnight.  She states that her abdominal pain is improved  and she is feeling less nauseous this morning.  She is passing flatus and even had a bowel movement around midnight.  Objective: Vitals:   04/23/18 2030 04/23/18 2100 04/23/18 2255 04/24/18 0629  BP: 132/89  (!) 141/95 (!) 132/92  Pulse: 87 84 89 93  Resp:    18  Temp:   98.9 F (37.2 C) 98.3 F (36.8 C)  TempSrc:   Oral Oral  SpO2: 90% 99% 97% 96%  Weight:   105.4 kg   Height:   5\' 2"  (1.575 m)    No intake or output data in the 24 hours ending 04/24/18 0849 Filed Weights   04/23/18 1645 04/23/18 2255  Weight: 92.5 kg 105.4 kg    Examination:  General exam: Appears calm and comfortable  Respiratory system: Clear to auscultation. Respiratory effort normal. Cardiovascular system: S1 & S2 heard, RRR. No JVD, murmurs, rubs, gallops or clicks. No pedal edema. Gastrointestinal system: Abdomen is nondistended, soft and nontender. No organomegaly or masses felt. Normal bowel sounds heard. Central nervous system: Alert and oriented. No focal neurological deficits. Extremities: Symmetric 5 x 5 power. Skin: No rashes, lesions or ulcers Psychiatry: Judgement and insight appear normal. Mood & affect appropriate.     Data Reviewed: I have personally reviewed following labs and imaging studies  CBC: Recent Labs  Lab 04/23/18 1712 04/24/18 0416  WBC 17.1* 20.5*  HGB 13.5 11.9*  HCT 43.9 39.5  MCV 84.3 85.3  PLT 382 010   Basic Metabolic Panel: Recent Labs  Lab 04/23/18 1712 04/24/18 0416  NA 138 139  K 4.1 3.7  CL 99 104  CO2 30 27  GLUCOSE 139* 132*  BUN 16 16  CREATININE 0.84 0.68  CALCIUM 9.2 8.7*  MG  --  1.9   GFR: Estimated Creatinine Clearance: 97 mL/min (by C-G formula based on SCr of 0.68 mg/dL). Liver Function Tests: Recent Labs  Lab 04/23/18 1712 04/24/18 0416  AST 24 20  ALT 20 18  ALKPHOS 75 65  BILITOT 0.4 0.5  PROT 7.8 6.8  ALBUMIN 3.6 3.1*   Recent Labs  Lab 04/23/18 1712  LIPASE 32   No results for input(s): AMMONIA in the last 168  hours. Coagulation Profile: No results for input(s): INR, PROTIME in the last 168 hours. Cardiac Enzymes: No results for input(s): CKTOTAL, CKMB, CKMBINDEX, TROPONINI in the last 168 hours. BNP (last 3 results) No results for input(s): PROBNP in the last 8760 hours. HbA1C: No results for input(s): HGBA1C in the last 72 hours. CBG: No results for input(s): GLUCAP in the last 168 hours. Lipid Profile: No results for input(s): CHOL, HDL, LDLCALC, TRIG, CHOLHDL, LDLDIRECT in the last 72 hours. Thyroid Function Tests: No results for input(s): TSH, T4TOTAL, FREET4, T3FREE, THYROIDAB in the last 72 hours. Anemia Panel: No results for input(s): VITAMINB12, FOLATE, FERRITIN, TIBC, IRON, RETICCTPCT in the last 72 hours. Sepsis Labs: No results for input(s): PROCALCITON, LATICACIDVEN in the last 168 hours.  No results found for this or any previous visit (from the past 240 hour(s)).       Radiology Studies: Ct Abdomen Pelvis Wo Contrast  Result Date: 04/23/2018 CLINICAL DATA:  Abdominal pain for 2 days. EXAM: CT ABDOMEN AND PELVIS WITHOUT CONTRAST TECHNIQUE: Multidetector CT imaging of the abdomen and pelvis was performed following the standard protocol without IV contrast. COMPARISON:  11/14/2016 FINDINGS: Lower chest: No acute abnormality. Hepatobiliary: Normal appearance of the liver. Cholelithiasis. No pericholecystic fluid or biliary ductal dilation. Pancreas: Unremarkable. No pancreatic ductal dilatation or surrounding inflammatory changes. Spleen: Normal in size without focal abnormality. Adrenals/Urinary Tract: Normal adrenal glands. Bilateral nonobstructive nephrolithiasis, including 6 mm calculus in the midpole region of the right kidney and 5 mm calculus in the lower pole of the left kidney. Normal ureters and urinary bladder. Stomach/Bowel: Normal appearance of the stomach. Borderline dilation of small bowel loops measuring between 2.6 and 3.5 cm. No discrete transitional point is  identified. Normal appendix. Mild left colonic diverticulosis. No evidence of diverticulitis. Vascular/Lymphatic: No significant vascular findings are present. No enlarged abdominal or pelvic lymph nodes. Reproductive: Status post hysterectomy. No adnexal masses. Other: No abdominal wall hernia or abnormality. Minimal free fluid anterior to the urinary bladder. Musculoskeletal: IMPRESSION: 1. Borderline dilation of small bowel loops up to 3.5 cm, suggestive of ileus versus less likely early/incomplete small bowel obstruction. 2. Cholelithiasis without CT evidence of acute cholecystitis. 3. Bilateral nonobstructive nephrolithiasis. Electronically Signed   By: Fidela Salisbury M.D.   On: 04/23/2018 20:01        Scheduled Meds:  amLODipine  10 mg Oral Daily   enoxaparin (LOVENOX) injection  0.5 mg/kg Subcutaneous Q24H   Continuous Infusions:  sodium chloride 100 mL/hr at 04/24/18 0012     LOS: 1 day    Time spent: 30 minutes     Darleen Crocker, DO [facility] Pager [phone]  If 7PM-7AM, please contact night-coverage [email or web address] Password [email or web address] 04/24/2018, 8:49 AM

## 2018-04-25 DIAGNOSIS — L918 Other hypertrophic disorders of the skin: Secondary | ICD-10-CM | POA: Diagnosis not present

## 2018-04-25 DIAGNOSIS — L700 Acne vulgaris: Secondary | ICD-10-CM | POA: Diagnosis not present

## 2018-04-25 LAB — CBC WITH DIFFERENTIAL/PLATELET
Abs Immature Granulocytes: 0.05 10*3/uL (ref 0.00–0.07)
Basophils Absolute: 0 10*3/uL (ref 0.0–0.1)
Basophils Relative: 0 %
Eosinophils Absolute: 0.3 10*3/uL (ref 0.0–0.5)
Eosinophils Relative: 2 %
HCT: 39.7 % (ref 36.0–46.0)
HEMOGLOBIN: 12 g/dL (ref 12.0–15.0)
Immature Granulocytes: 0 %
Lymphocytes Relative: 26 %
Lymphs Abs: 3.3 10*3/uL (ref 0.7–4.0)
MCH: 25.6 pg — ABNORMAL LOW (ref 26.0–34.0)
MCHC: 30.2 g/dL (ref 30.0–36.0)
MCV: 84.6 fL (ref 80.0–100.0)
MONO ABS: 0.9 10*3/uL (ref 0.1–1.0)
MONOS PCT: 7 %
Neutro Abs: 8.3 10*3/uL — ABNORMAL HIGH (ref 1.7–7.7)
Neutrophils Relative %: 65 %
Platelets: 346 10*3/uL (ref 150–400)
RBC: 4.69 MIL/uL (ref 3.87–5.11)
RDW: 15.5 % (ref 11.5–15.5)
WBC: 12.9 10*3/uL — ABNORMAL HIGH (ref 4.0–10.5)
nRBC: 0 % (ref 0.0–0.2)

## 2018-04-25 LAB — COMPREHENSIVE METABOLIC PANEL
ALBUMIN: 3.1 g/dL — AB (ref 3.5–5.0)
ALT: 17 U/L (ref 0–44)
AST: 19 U/L (ref 15–41)
Alkaline Phosphatase: 70 U/L (ref 38–126)
Anion gap: 6 (ref 5–15)
BUN: 10 mg/dL (ref 6–20)
CO2: 29 mmol/L (ref 22–32)
CREATININE: 0.63 mg/dL (ref 0.44–1.00)
Calcium: 8.5 mg/dL — ABNORMAL LOW (ref 8.9–10.3)
Chloride: 105 mmol/L (ref 98–111)
GFR calc Af Amer: >60 mL/min (ref >60)
GFR calc non Af Amer: >60 mL/min (ref >60)
Glucose, Bld: 110 mg/dL — ABNORMAL HIGH (ref 70–99)
Potassium: 3.5 mmol/L (ref 3.5–5.1)
Sodium: 140 mmol/L (ref 135–145)
Total Bilirubin: 0.3 mg/dL (ref 0.3–1.2)
Total Protein: 6.8 g/dL (ref 6.5–8.1)

## 2018-04-25 LAB — HIV ANTIBODY (ROUTINE TESTING W REFLEX): HIV Screen 4th Generation wRfx: NONREACTIVE

## 2018-04-25 MED ORDER — ONDANSETRON HCL 4 MG PO TABS: 4.0000 mg | ORAL_TABLET | Freq: Four times a day (QID) | ORAL | 0 refills | Status: DC | PRN

## 2018-04-25 NOTE — Progress Notes (Signed)
IV discontinued,catheter intact. Patient discharged home with instructions given on medications and follow up visits,patient verbalized understanding.Accompanied by staff to an awaiting vehicle. 

## 2018-04-25 NOTE — Discharge Summary (Signed)
Physician Discharge Summary  [name] MEQ:[id number] DOB: 10-03-1970 DOA: 04/23/2018  PCP: Asencion Noble, MD  Admit date: 04/23/2018  Discharge date: 04/25/2018  Admitted From: Home  Disposition: Home  Recommendations for Outpatient Follow-up:  1. Follow up with PCP in 1-2 weeks 2. Continue on Zofran as needed for any nausea or vomiting  Home Health: None  Equipment/Devices: None  Discharge Condition: Stable  CODE STATUS: Full  Diet recommendation: Heart Healthy  Brief/Interim Summary: Per HPI: Sonya Kline a47 y.o.female,with history of diverticulosis, hypertension came to ED with complaints of abdominal pain of 2 days duration. She had nausea but no vomiting. Denies diarrhea. Last BM was yesterday. She did not pass much gas today. Denies dizziness or blurred vision. Denies chest pain or shortness of breath. Denies fever or chills. In the ED CT scan of the abdomen pelvis revealed cholelithiasis and ileus versus SBO. No cholecystitis General surgery was consulted, at this time general surgery recommends abdominal ultrasound in a.m.  Patient was admitted with ileus versus SBO and had rapid improvement in her symptomatology and is now tolerating diet with no further issues or concerns.  She was seen in consultation by general surgery with no other recommendations at this time.  She was noted to have leukocytosis on admission which has improved considerably overnight with hydration.  Abdominal ultrasound with no acute findings noted.  She is otherwise stable for discharge on her usual diet and Zofran has been given as needed.  Discharge Diagnoses:  Active Problems:   Ileus ([facility])   Pain of upper abdomen  Principal discharge diagnosis: Partial SBO versus mild ileus-resolved.  Discharge Instructions  Discharge Instructions    Diet - low sodium heart healthy   Complete by:  As directed    Increase activity slowly   Complete by:  As directed      Allergies as  of 04/25/2018      Reactions   Contrast Media [iodinated Diagnostic Agents] Hives   Prednisone Rash      Medication List    TAKE these medications   amLODipine 10 MG tablet Commonly known as:  NORVASC Take 10 mg by mouth daily.   ondansetron 4 MG tablet Commonly known as:  ZOFRAN Take 1 tablet (4 mg total) by mouth every 6 (six) hours as needed for nausea.      Follow-up Information    Asencion Noble, MD In 1 week.   Specialty:  Internal Medicine Contact information: [address] [place] [address] [phone]          Allergies  Allergen Reactions  . Contrast Media [Iodinated Diagnostic Agents] Hives  . Prednisone Rash    Consultations:  General surgery   Procedures/Studies: Ct Abdomen Pelvis Wo Contrast  Result Date: 04/23/2018 CLINICAL DATA:  Abdominal pain for 2 days. EXAM: CT ABDOMEN AND PELVIS WITHOUT CONTRAST TECHNIQUE: Multidetector CT imaging of the abdomen and pelvis was performed following the standard protocol without IV contrast. COMPARISON:  11/14/2016 FINDINGS: Lower chest: No acute abnormality. Hepatobiliary: Normal appearance of the liver. Cholelithiasis. No pericholecystic fluid or biliary ductal dilation. Pancreas: Unremarkable. No pancreatic ductal dilatation or surrounding inflammatory changes. Spleen: Normal in size without focal abnormality. Adrenals/Urinary Tract: Normal adrenal glands. Bilateral nonobstructive nephrolithiasis, including 6 mm calculus in the midpole region of the right kidney and 5 mm calculus in the lower pole of the left kidney. Normal ureters and urinary bladder. Stomach/Bowel: Normal appearance of the stomach. Borderline dilation of small bowel loops measuring between 2.6 and 3.5 cm.  No discrete transitional point is identified. Normal appendix. Mild left colonic diverticulosis. No evidence of diverticulitis. Vascular/Lymphatic: No significant vascular findings are present. No enlarged abdominal or pelvic lymph nodes.  Reproductive: Status post hysterectomy. No adnexal masses. Other: No abdominal wall hernia or abnormality. Minimal free fluid anterior to the urinary bladder. Musculoskeletal: IMPRESSION: 1. Borderline dilation of small bowel loops up to 3.5 cm, suggestive of ileus versus less likely early/incomplete small bowel obstruction. 2. Cholelithiasis without CT evidence of acute cholecystitis. 3. Bilateral nonobstructive nephrolithiasis. Electronically Signed   By: Fidela Salisbury M.D.   On: 04/23/2018 20:01   US Abdomen Complete  Result Date: 04/24/2018 CLINICAL DATA:  Initial evaluation for acute right upper quadrant pain for 3 days, nausea. EXAM: ABDOMEN ULTRASOUND COMPLETE COMPARISON:  Prior CT from 04/23/2018. FINDINGS: Gallbladder: No gallstones or wall thickening visualized. No free pericholecystic fluid. No sonographic Murphy sign noted by sonographer. Common bile duct: Diameter: 1.9 mm Liver: No focal lesion identified. Within normal limits in parenchymal echogenicity. Portal vein is patent on color Doppler imaging with normal direction of blood flow towards the liver. IVC: No abnormality visualized. Pancreas: Visualized portion unremarkable. Spleen: Size and appearance within normal limits. Right Kidney: Length: 11.5 cm. Echogenicity within normal limits. No mass or hydronephrosis visualized. 7 mm shadowing echogenic stone present within the interpolar right kidney. Left Kidney: Length: 11.1 cm. Echogenicity within normal limits. No mass or hydronephrosis visualized. Previously noted left renal nephrolithiasis not visualized by sonography. Abdominal aorta: No aneurysm visualized. Other findings: None. IMPRESSION: 1. No acute abnormality within the abdomen. Specifically, normal sonographic appearance of the gallbladder with no evidence for cholelithiasis, acute cholecystitis, or biliary dilatation. 2. Nonobstructive right renal nephrolithiasis. 3. Otherwise unremarkable abdominal ultrasound. Electronically  Signed   By: Jeannine Boga M.D.   On: 04/24/2018 14:14      Discharge Exam: Vitals:   04/24/18 2044 04/24/18 2115  BP:  140/89  Pulse:  85  Resp:  19  Temp:  98 F (36.7 C)  SpO2: 95% 98%   Vitals:   04/24/18 0629 04/24/18 1459 04/24/18 2044 04/24/18 2115  BP: (!) 132/92 127/90  140/89  Pulse: 93 85  85  Resp: 18 18  19   Temp: 98.3 F (36.8 C) 98.2 F (36.8 C)  98 F (36.7 C)  TempSrc: Oral Oral  Oral  SpO2: 96% 99% 95% 98%  Weight:      Height:        General: Pt is alert, awake, not in acute distress Cardiovascular: RRR, S1/S2 +, no rubs, no gallops Respiratory: CTA bilaterally, no wheezing, no rhonchi Abdominal: Soft, NT, ND, bowel sounds + Extremities: no edema, no cyanosis    The results of significant diagnostics from this hospitalization (including imaging, microbiology, ancillary and laboratory) are listed below for reference.     Microbiology: No results found for this or any previous visit (from the past 240 hour(s)).   Labs: BNP (last 3 results) No results for input(s): BNP in the last 8760 hours. Basic Metabolic Panel: Recent Labs  Lab 04/23/18 1712 04/24/18 0416 04/25/18 0433  NA 138 139 140  K 4.1 3.7 3.5  CL 99 104 105  CO2 30 27 29   GLUCOSE 139* 132* 110*  BUN 16 16 10   CREATININE 0.84 0.68 0.63  CALCIUM 9.2 8.7* 8.5*  MG  --  1.9  --    Liver Function Tests: Recent Labs  Lab 04/23/18 1712 04/24/18 0416 04/25/18 0433  AST 24 20 19   ALT 20  18 17  ALKPHOS 75 65 70  BILITOT 0.4 0.5 0.3  PROT 7.8 6.8 6.8  ALBUMIN 3.6 3.1* 3.1*   Recent Labs  Lab 04/23/18 1712  LIPASE 32   No results for input(s): AMMONIA in the last 168 hours. CBC: Recent Labs  Lab 04/23/18 1712 04/24/18 0416 04/25/18 0433  WBC 17.1* 20.5* 12.9*  NEUTROABS  --   --  8.3*  HGB 13.5 11.9* 12.0  HCT 43.9 39.5 39.7  MCV 84.3 85.3 84.6  PLT 382 361 346   Cardiac Enzymes: No results for input(s): CKTOTAL, CKMB, CKMBINDEX, TROPONINI in the  last 168 hours. BNP: Invalid input(s): POCBNP CBG: No results for input(s): GLUCAP in the last 168 hours. D-Dimer No results for input(s): DDIMER in the last 72 hours. Hgb A1c No results for input(s): HGBA1C in the last 72 hours. Lipid Profile No results for input(s): CHOL, HDL, LDLCALC, TRIG, CHOLHDL, LDLDIRECT in the last 72 hours. Thyroid function studies No results for input(s): TSH, T4TOTAL, T3FREE, THYROIDAB in the last 72 hours.  Invalid input(s): FREET3 Anemia work up No results for input(s): VITAMINB12, FOLATE, FERRITIN, TIBC, IRON, RETICCTPCT in the last 72 hours. Urinalysis    Component Value Date/Time   COLORURINE YELLOW 04/23/2018 2213   APPEARANCEUR HAZY (A) 04/23/2018 2213   LABSPEC 1.023 04/23/2018 2213   PHURINE 5.0 04/23/2018 2213   GLUCOSEU NEGATIVE 04/23/2018 2213   HGBUR LARGE (A) 04/23/2018 2213   BILIRUBINUR NEGATIVE 04/23/2018 2213   KETONESUR NEGATIVE 04/23/2018 2213   PROTEINUR NEGATIVE 04/23/2018 2213   UROBILINOGEN 0.2 10/17/2008 1602   NITRITE NEGATIVE 04/23/2018 2213   LEUKOCYTESUR SMALL (A) 04/23/2018 2213   Sepsis Labs Invalid input(s): PROCALCITONIN,  WBC,  LACTICIDVEN Microbiology No results found for this or any previous visit (from the past 240 hour(s)).   Time coordinating discharge: 35 minutes  SIGNED:   Rodena Goldmann, DO [facility] 04/25/2018, 10:18 AM  If 7PM-7AM, please contact night-coverage [email or web address] Password [email or web address]

## 2018-08-14 ENCOUNTER — Other Ambulatory Visit: Payer: Self-pay

## 2018-08-14 ENCOUNTER — Other Ambulatory Visit: Payer: BLUE CROSS/BLUE SHIELD

## 2018-08-14 DIAGNOSIS — Z20822 Contact with and (suspected) exposure to covid-19: Secondary | ICD-10-CM

## 2018-08-14 DIAGNOSIS — R6889 Other general symptoms and signs: Secondary | ICD-10-CM | POA: Diagnosis not present

## 2018-08-21 LAB — NOVEL CORONAVIRUS, NAA: SARS-CoV-2, NAA: NOT DETECTED

## 2018-09-25 IMAGING — CT CT RENAL STONE PROTOCOL
2 of 4 series · 16 of 46 positions shown, 18 images · non-contrast
Comparison: 10/17/2008 CT.

CLINICAL DATA: 46-year-old hypertensive female with right flank
pain since this afternoon. History of kidney stones. Initial
encounter.

EXAM:
CT ABDOMEN AND PELVIS WITHOUT CONTRAST
TECHNIQUE: Multidetector CT imaging of the abdomen and pelvis was performed
following the standard protocol without IV contrast.

[Series 2: axial st · axial · 0.76mm/px · z∈[+998,+1428]mm · 13 of 94 slices shown, 15 images]
[im 4/94  soft-tissue]
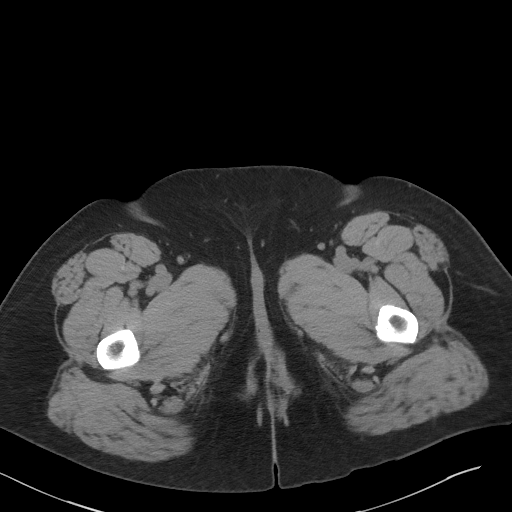
[im 4/94  bone]
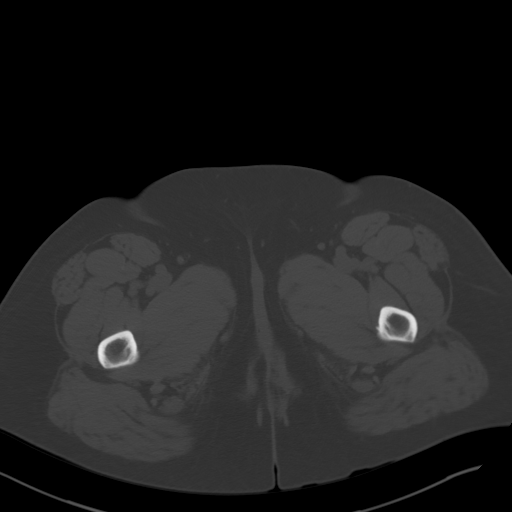
[im 12/94  soft-tissue]
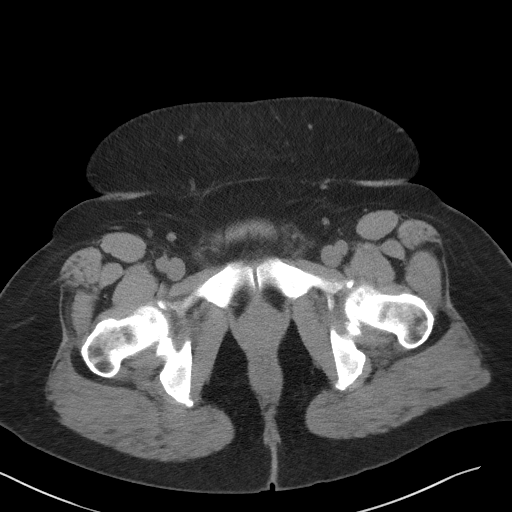
[im 19/94  soft-tissue]
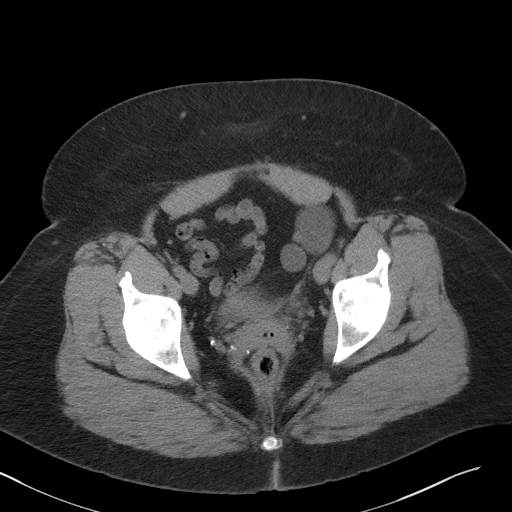
[im 27/94  soft-tissue]
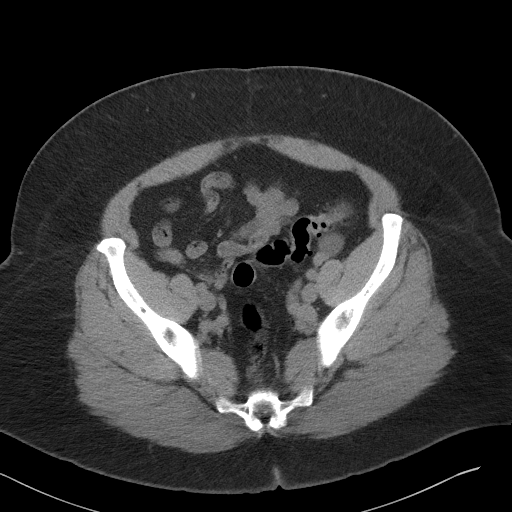
[im 34/94  soft-tissue]
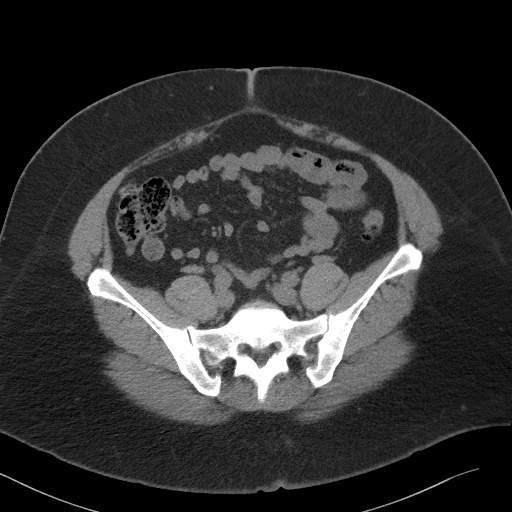
[im 41/94  soft-tissue]
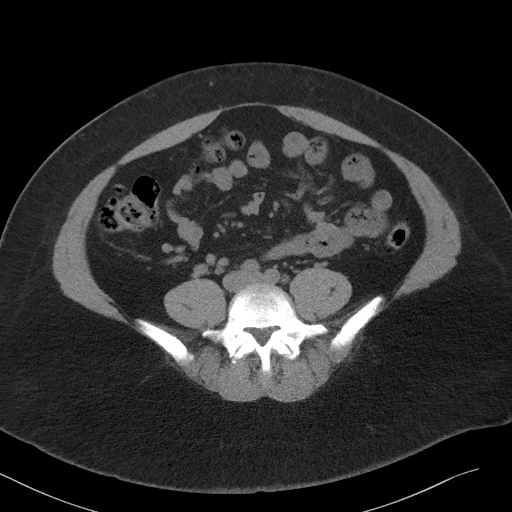
[im 49/94  soft-tissue]
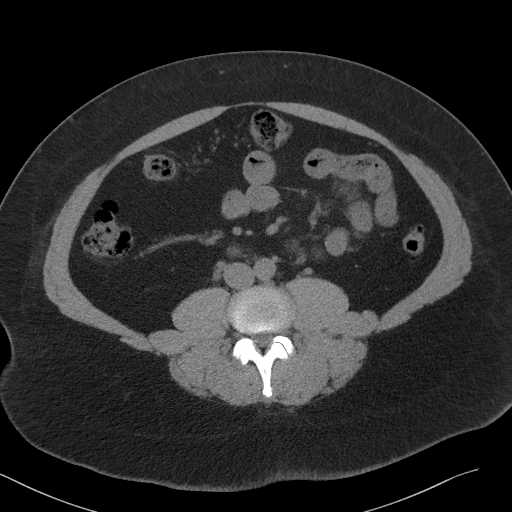
[im 53/94  soft-tissue]
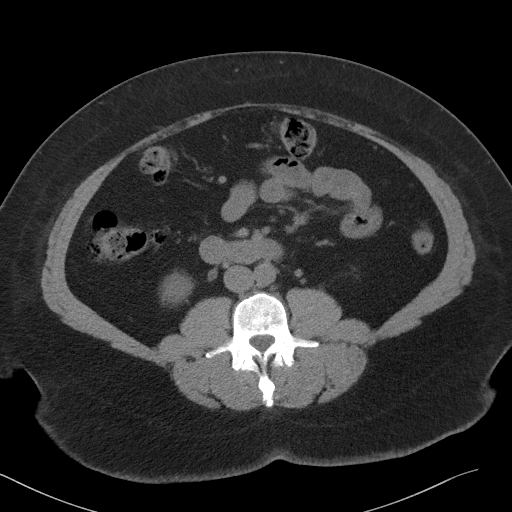
[im 60/94  soft-tissue]
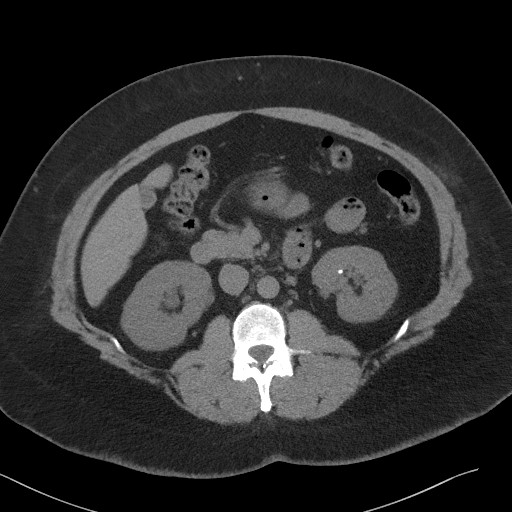
[im 60/94  bone]
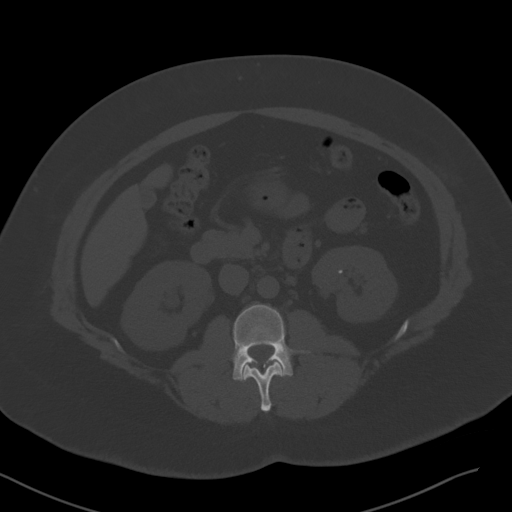
[im 67/94  soft-tissue]
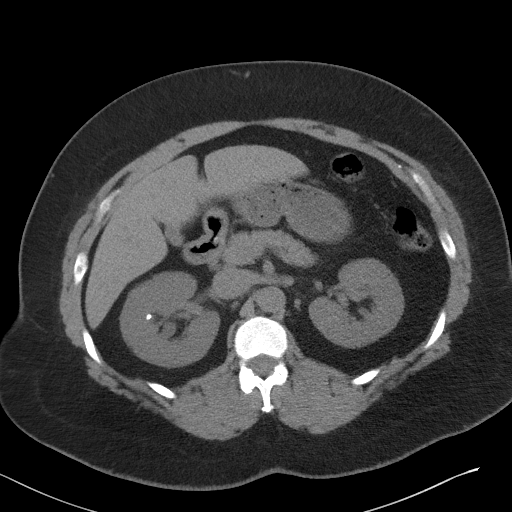
[im 75/94  soft-tissue]
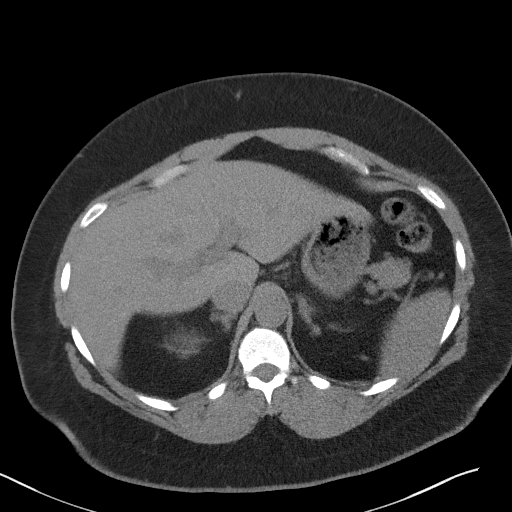
[im 82/94  soft-tissue]
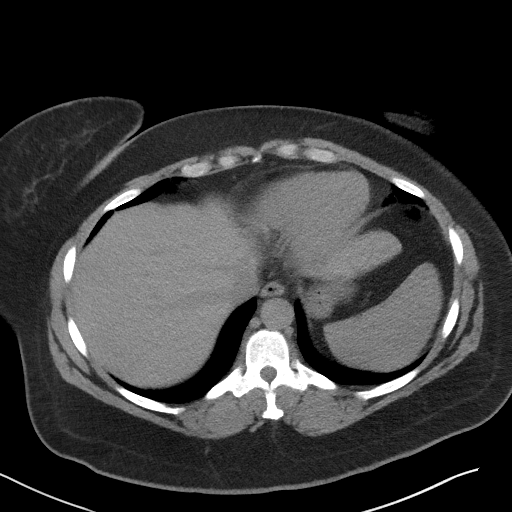
[im 90/94  soft-tissue]
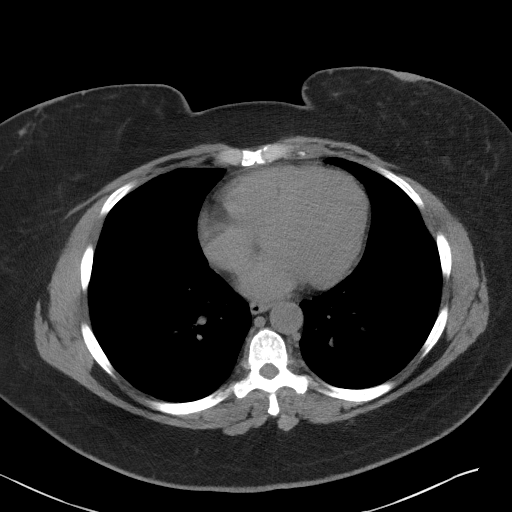

[Series 5: coronal st · coronal · 0.77mm/px · 3 of 101 slices shown]
[im 34/101  soft-tissue]
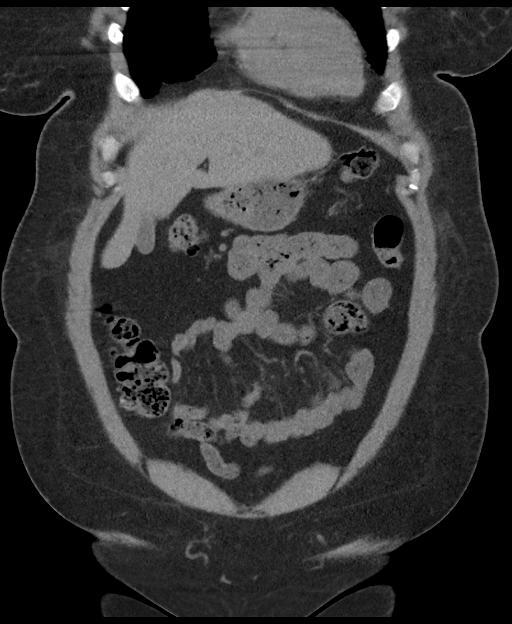
[im 45/101  soft-tissue]
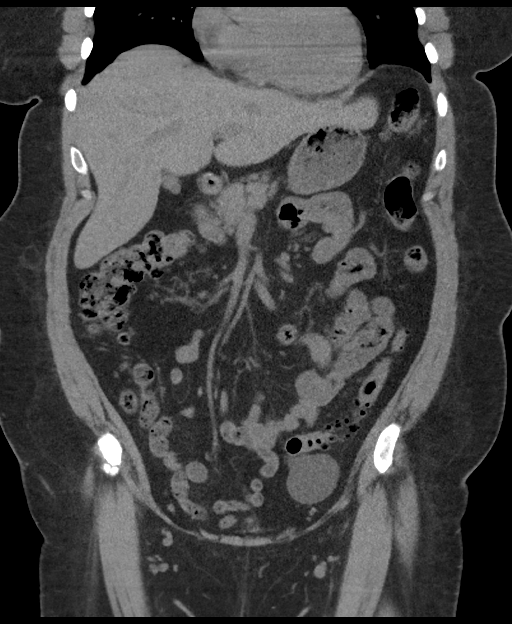
[im 56/101  soft-tissue]
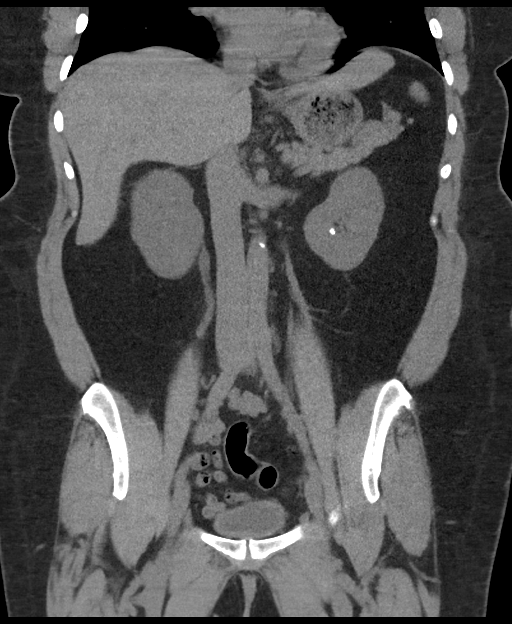

[16 of 46 positions shown; findings below may reference images not displayed]

FINDINGS: Lower chest: Tiny lung base nodules (one of which is calcified)
unchanged from prior CT. Heart size top-normal.

Hepatobiliary: Taking into account limitation by non contrast
imaging, no worrisome hepatic lesion. No calcified gallstone.

Pancreas: Taking into account limitation by non contrast imaging, no
pancreatic mass or inflammation

Spleen: Taking into account limitation by non contrast imaging, no
splenic mass or enlargement.

Adrenals/Urinary Tract: 3 mm distal right ureteral partially
obstructing stone located 2 cm proximal to the right ureterovesical
junction with mild right hydroureteronephrosis.

Nonobstructing right upper pole 5 mm stone and right lower pole 2 mm
stone.

Left lower pole 5 mm and 2 mm nonobstructing stones.

Taking into account limitation by non contrast imaging, no worrisome
renal mass or adrenal lesion.

Partially contracted urinary bladder with mild circumferential
thickening.

Stomach/Bowel: Descending colon and sigmoid colon diverticulosis
without findings of diverticulitis.

Vascular/Lymphatic: Trace aortic calcifications without abdominal
aortic aneurysm.

Left common iliac artery lymph node which short axis dimension of
7.6 mm unchanged.

Reproductive: Post hysterectomy. Left ovary with several cysts
largest 4.9 mm. This can be assessed with follow-up pelvic sonogram
in 6-12 weeks.

Other: No bowel containing hernia.

Musculoskeletal: No worrisome osseous abnormality.
IMPRESSION: 3 mm distal right ureteral partially obstructing stone located 2 cm
proximal to the right ureterovesical junction with mild right
hydroureteronephrosis.

Bilateral nonobstructing renal calculi otherwise noted.

Descending colon and sigmoid colon diverticulosis.

Trace aortic calcifications.

Post hysterectomy.

Left ovary with several cysts largest 4.9 mm. This can be assessed
with follow-up pelvic sonogram in 6-12 weeks.

## 2018-10-07 ENCOUNTER — Other Ambulatory Visit: Payer: Self-pay

## 2018-10-07 DIAGNOSIS — Z20822 Contact with and (suspected) exposure to covid-19: Secondary | ICD-10-CM

## 2018-10-07 DIAGNOSIS — R6889 Other general symptoms and signs: Secondary | ICD-10-CM | POA: Diagnosis not present

## 2018-10-08 LAB — NOVEL CORONAVIRUS, NAA: SARS-CoV-2, NAA: NOT DETECTED

## 2018-10-09 ENCOUNTER — Telehealth: Payer: Self-pay | Admitting: Internal Medicine

## 2018-10-09 NOTE — Telephone Encounter (Signed)
Patient called in and received her covid test results °

## 2019-01-01 ENCOUNTER — Other Ambulatory Visit: Payer: Self-pay

## 2019-01-01 DIAGNOSIS — Z20822 Contact with and (suspected) exposure to covid-19: Secondary | ICD-10-CM

## 2019-01-02 DIAGNOSIS — J029 Acute pharyngitis, unspecified: Secondary | ICD-10-CM | POA: Diagnosis not present

## 2019-01-02 DIAGNOSIS — J028 Acute pharyngitis due to other specified organisms: Secondary | ICD-10-CM | POA: Diagnosis not present

## 2019-01-03 ENCOUNTER — Telehealth: Payer: Self-pay

## 2019-01-03 LAB — NOVEL CORONAVIRUS, NAA: SARS-CoV-2, NAA: NOT DETECTED

## 2019-01-03 NOTE — Telephone Encounter (Signed)
Patient called and was informed that her COVID-19 test was still pending result.  She will call later.

## 2019-02-27 DIAGNOSIS — Z1272 Encounter for screening for malignant neoplasm of vagina: Secondary | ICD-10-CM | POA: Diagnosis not present

## 2019-02-27 DIAGNOSIS — Z01419 Encounter for gynecological examination (general) (routine) without abnormal findings: Secondary | ICD-10-CM | POA: Diagnosis not present

## 2019-02-27 DIAGNOSIS — Z6841 Body Mass Index (BMI) 40.0 and over, adult: Secondary | ICD-10-CM | POA: Diagnosis not present

## 2019-02-28 ENCOUNTER — Other Ambulatory Visit (HOSPITAL_COMMUNITY): Payer: Self-pay | Admitting: Internal Medicine

## 2019-02-28 DIAGNOSIS — Z1231 Encounter for screening mammogram for malignant neoplasm of breast: Secondary | ICD-10-CM

## 2019-03-10 ENCOUNTER — Other Ambulatory Visit: Payer: Self-pay

## 2019-03-10 ENCOUNTER — Ambulatory Visit (HOSPITAL_COMMUNITY)
Admission: RE | Admit: 2019-03-10 | Discharge: 2019-03-10 | Disposition: A | Discharge status: Home / Self Care | Payer: BC Managed Care – PPO | Source: Ambulatory Visit | Attending: Internal Medicine | Admitting: Internal Medicine

## 2019-03-10 DIAGNOSIS — Z1231 Encounter for screening mammogram for malignant neoplasm of breast: Secondary | ICD-10-CM | POA: Insufficient documentation

## 2019-03-17 ENCOUNTER — Encounter: Payer: Self-pay | Admitting: Gastroenterology

## 2019-04-19 ENCOUNTER — Ambulatory Visit
Admission: EM | Admit: 2019-04-19 | Discharge: 2019-04-19 | Disposition: A | Discharge status: Home / Self Care | Payer: BC Managed Care – PPO | Attending: Emergency Medicine | Admitting: Emergency Medicine

## 2019-04-19 ENCOUNTER — Other Ambulatory Visit: Payer: Self-pay

## 2019-04-19 DIAGNOSIS — S93491A Sprain of other ligament of right ankle, initial encounter: Secondary | ICD-10-CM

## 2019-04-19 DIAGNOSIS — M25571 Pain in right ankle and joints of right foot: Secondary | ICD-10-CM | POA: Diagnosis not present

## 2019-04-19 DIAGNOSIS — M79671 Pain in right foot: Secondary | ICD-10-CM

## 2019-04-19 MED ORDER — NAPROXEN 375 MG PO TABS: 375.0000 mg | ORAL_TABLET | Freq: Two times a day (BID) | ORAL | 0 refills | Status: DC

## 2019-04-19 NOTE — Discharge Instructions (Signed)
Will hold off on x-rays today Continue conservative management of rest, ice, and elevation Ankle brace given in office.   Take naproxen as needed for pain relief (may cause abdominal discomfort, ulcers, and GI bleeds avoid taking with other NSAIDs) Follow up with PCP if symptoms persist Return or go to the ER if you have any new or worsening symptoms (fever, chills, chest pain, redness, bruising, swelling, worsening symptoms despite treatment, etc...)

## 2019-04-19 NOTE — ED Provider Notes (Signed)
[facility]   [id number] 04/19/19 Arrival Time: [id number]  CC: RT ankle/ foot pain  SUBJECTIVE: History from: patient. Sonya Kline is a 49 y.o. female complains of RT ankle/ foot pain x 3 days.  Denies a precipitating event or specific injury, but states she has been walking for often recently.  Localizes the pain to the top of foot and outside of ankle.  Describes the pain as intermittent and sharp in character.  Has tried OTC ibuprofen with minimal relief.  Symptoms are made worse with weight-bearing/ walking.  Denies similar symptoms in the past.  Complains of associated swelling.  Denies fever, chills, erythema, ecchymosis, weakness, numbness and tingling.    ROS: As per HPI.  All other pertinent ROS negative.     Past Medical History:  Diagnosis Date  . Diverticulosis   . HTN (hypertension)    Past Surgical History:  Procedure Laterality Date  . ABDOMINAL HYSTERECTOMY    . CESAREAN SECTION     x 2   Allergies  Allergen Reactions  . Contrast Media [Iodinated Diagnostic Agents] Hives  . Prednisone Rash   No current facility-administered medications on file prior to encounter.   Current Outpatient Medications on File Prior to Encounter  Medication Sig Dispense Refill  . amLODipine (NORVASC) 10 MG tablet Take 10 mg by mouth daily.     . ondansetron (ZOFRAN) 4 MG tablet Take 1 tablet (4 mg total) by mouth every 6 (six) hours as needed for nausea. 20 tablet 0   Social History   Socioeconomic History  . Marital status: Married    Spouse name: Not on file  . Number of children: Not on file  . Years of education: college  . Highest education level: Not on file  Occupational History  . Not on file  Tobacco Use  . Smoking status: Never Smoker  . Smokeless tobacco: Never Used  Substance and Sexual Activity  . Alcohol use: No  . Drug use: No  . Sexual activity: Yes    Birth control/protection: None, Surgical  Other Topics Concern  . Not on file  Social History  Narrative  . Not on file   Social Determinants of Health   Financial Resource Strain:   . Difficulty of Paying Living Expenses: Not on file  Food Insecurity:   . Worried About Charity fundraiser in the Last Year: Not on file  . Ran Out of Food in the Last Year: Not on file  Transportation Needs:   . Lack of Transportation (Medical): Not on file  . Lack of Transportation (Non-Medical): Not on file  Physical Activity:   . Days of Exercise per Week: Not on file  . Minutes of Exercise per Session: Not on file  Stress:   . Feeling of Stress : Not on file  Social Connections:   . Frequency of Communication with Friends and Family: Not on file  . Frequency of Social Gatherings with Friends and Family: Not on file  . Attends Religious Services: Not on file  . Active Member of Clubs or Organizations: Not on file  . Attends Archivist Meetings: Not on file  . Marital Status: Not on file  Intimate Partner Violence:   . Fear of Current or Ex-Partner: Not on file  . Emotionally Abused: Not on file  . Physically Abused: Not on file  . Sexually Abused: Not on file   History reviewed. No pertinent family history.  OBJECTIVE:  Vitals:   04/19/19 [id number]  BP: 126/83  Pulse: 82  Resp: 17  Temp: 98.3 F (36.8 C)  TempSrc: Oral  SpO2: 97%    General appearance: ALERT; in no acute distress.  Head: NCAT Lungs: Normal respiratory effort CV: Dorsalis pedis pulse 2+ Musculoskeletal: RT ankle/ foot Inspection: Skin warm, dry, clear and intact without obvious erythema, or ecchymosis. Mild swelling over lateral ankle Palpation: Mildly TTP over lateral ankle and anterior ankle/ proximal foot ROM: FROM active and passive; discomfort with ankle inversion/ eversion Strength: 5/5 dorsiflexion, 5/5 plantar flexion Skin: warm and dry Neurologic: Ambulates with minimal difficulty; Sensation intact about the lower extremities Psychological: alert and cooperative; normal mood and  affect   ASSESSMENT & PLAN:  1. Acute right ankle pain   2. Sprain of anterior talofibular ligament of right ankle, initial encounter   3. Right foot pain     Meds ordered this encounter  Medications  . naproxen (NAPROSYN) 375 MG tablet    Sig: Take 1 tablet (375 mg total) by mouth 2 (two) times daily.    Dispense:  20 tablet    Refill:  0    Order Specific Question:   Supervising Provider    Answer:   Raylene Everts [id number]   Will hold off on x-rays today Continue conservative management of rest, ice, and elevation Ankle brace given in office.   Take naproxen as needed for pain relief (may cause abdominal discomfort, ulcers, and GI bleeds avoid taking with other NSAIDs) Follow up with PCP if symptoms persist Return or go to the ER if you have any new or worsening symptoms (fever, chills, chest pain, redness, bruising, swelling, worsening symptoms despite treatment, etc...)   Reviewed expectations re: course of current medical issues. Questions answered. Outlined signs and symptoms indicating need for more acute intervention. Patient verbalized understanding. After Visit Summary given.    Lestine Box, PA-C 04/19/19 [phone]

## 2019-04-19 NOTE — ED Triage Notes (Signed)
Pt has been walking more frequently and developed right foot pain on top of foot

## 2019-04-21 ENCOUNTER — Ambulatory Visit (HOSPITAL_COMMUNITY)
Admission: RE | Admit: 2019-04-21 | Discharge: 2019-04-21 | Disposition: A | Discharge status: Home / Self Care | Payer: BC Managed Care – PPO | Source: Ambulatory Visit | Attending: Internal Medicine | Admitting: Internal Medicine

## 2019-04-21 ENCOUNTER — Other Ambulatory Visit (HOSPITAL_COMMUNITY): Payer: Self-pay | Admitting: Internal Medicine

## 2019-04-21 ENCOUNTER — Encounter (HOSPITAL_COMMUNITY): Payer: Self-pay

## 2019-04-21 ENCOUNTER — Other Ambulatory Visit: Payer: Self-pay

## 2019-04-21 DIAGNOSIS — S93601A Unspecified sprain of right foot, initial encounter: Secondary | ICD-10-CM | POA: Insufficient documentation

## 2019-04-21 DIAGNOSIS — M79671 Pain in right foot: Secondary | ICD-10-CM | POA: Diagnosis not present

## 2019-06-05 ENCOUNTER — Other Ambulatory Visit: Payer: Self-pay

## 2019-06-05 ENCOUNTER — Ambulatory Visit (INDEPENDENT_AMBULATORY_CARE_PROVIDER_SITE_OTHER): Payer: Self-pay | Admitting: *Deleted

## 2019-06-05 DIAGNOSIS — Z1211 Encounter for screening for malignant neoplasm of colon: Secondary | ICD-10-CM

## 2019-06-05 NOTE — Progress Notes (Signed)
Gastroenterology Pre-Procedure Review  Request Date: 06/05/2019 Requesting Physician: 10 year repeat, Last TCS 03/18/2009 done by Dr. Oneida Alar, no polyps  PATIENT REVIEW QUESTIONS: The patient responded to the following health history questions as indicated:    1. Diabetes Melitis: no 2. Joint replacements in the past 12 months: no 3. Major health problems in the past 3 months: no 4. Has an artificial valve or MVP: no 5. Has a defibrillator: no 6. Has been advised in past to take antibiotics in advance of a procedure like teeth cleaning: no 7. Family history of colon cancer: no  8. Alcohol Use: no 9. Illicit drug Use: no 10. History of sleep apnea: no  11. History of coronary artery or other vascular stents placed within the last 12 months: no 12. History of any prior anesthesia complications: no 13. There is no height or weight on file to calculate BMI.ht: 5'2 wt: 210 lbs    MEDICATIONS & ALLERGIES:    Patient reports the following regarding taking any blood thinners:   Plavix? no Aspirin? no Coumadin? no Brilinta? no Xarelto? no Eliquis? no Pradaxa? no Savaysa? no Effient? no  Patient confirms/reports the following medications:  Current Outpatient Medications  Medication Sig Dispense Refill  . amLODipine (NORVASC) 10 MG tablet Take 10 mg by mouth daily.      No current facility-administered medications for this visit.    Patient confirms/reports the following allergies:  Allergies  Allergen Reactions  . Contrast Media [Iodinated Diagnostic Agents] Hives  . Prednisone Rash    No orders of the defined types were placed in this encounter.   AUTHORIZATION INFORMATION Primary Insurance: [place],  [place] #: [id number],  Group #: [id number] Pre-Cert / Josem Kaufmann required: No, file to local BCBS   SCHEDULE INFORMATION: Procedure has been scheduled as follows:  Date: 08/22/2019, Time: 9:30 Location: [facility] with Dr. Gala Romney  This Gastroenterology Pre-Precedure Review Form is being  routed to the following provider(s): Neil Crouch, PA

## 2019-06-05 NOTE — Patient Instructions (Addendum)
Sonya Kline   Oct 02, 1970 MRN: [id number]    Procedure Date: 08/22/2019 Time to register: 8:30 am Place to register: [facility] Procedure Time: 9:30 am Scheduled provider: Dr. Gala Romney  PREPARATION FOR COLONOSCOPY WITH TRI-LYTE SPLIT PREP  Please notify us immediately if you are diabetic, take iron supplements, or if you are on Coumadin or any other blood thinners.   Please hold the following medications: n/a  You will need to purchase 1 fleet enema and 1 box of Bisacodyl '5mg'$  tablets.   2 DAYS BEFORE PROCEDURE:  DATE: 08/20/2019  DAY: Wednesday Begin clear liquid diet AFTER your lunch meal. NO SOLID FOODS after this point.  1 DAY BEFORE PROCEDURE:  DATE: 08/21/2019   DAY: Thursday Continue clear liquids the entire day - NO SOLID FOOD.   Diabetic medications adjustments for today: n/a  At 2:00 pm:  Take 2 Bisacodyl tablets.   At 4:00pm:  Start drinking your solution. Make sure you mix well per instructions on the bottle. Try to drink 1 (one) 8 ounce glass every 10-15 minutes until you have consumed HALF the jug. You should complete by 6:00pm.You must keep the left over solution refrigerated until completed next day.  Continue clear liquids. You must drink plenty of clear liquids to prevent dehyration and kidney failure.     DAY OF PROCEDURE:   DATE: 08/22/2019   DAY: Friday If you take medications for your heart, blood pressure or breathing, you may take these medications.  Diabetic medications adjustments for today: n/a  Five hours before your procedure time @ 4:30 am:  Finish remaining amout of bowel prep, drinking 1 (one) 8 ounce glass every 10-15 minutes until complete. You have two hours to consume remaining prep.   Three hours before your procedure time @ 6:30 am:  Nothing by mouth.   At least one hour before going to the hospital:  Give yourself one Fleet enema. You may take your morning medications with sip of water unless we have instructed otherwise.       Please see below for Dietary Information.  CLEAR LIQUIDS INCLUDE:  Water Jello (NOT red in color)   Ice Popsicles (NOT red in color)   Tea (sugar ok, no milk/cream) Powdered fruit flavored drinks  Coffee (sugar ok, no milk/cream) Gatorade/ Lemonade/ Kool-Aid  (NOT red in color)   Juice: apple, white grape, white cranberry Soft drinks  Clear bullion, consomme, broth (fat free beef/chicken/vegetable)  Carbonated beverages (any kind)  Strained chicken noodle soup Hard Candy   Remember: Clear liquids are liquids that will allow you to see your fingers on the other side of a clear glass. Be sure liquids are NOT red in color, and not cloudy, but CLEAR.  DO NOT EAT OR DRINK ANY OF THE FOLLOWING:  Dairy products of any kind   Cranberry juice Tomato juice / V8 juice   Grapefruit juice Orange juice     Red grape juice  Do not eat any solid foods, including such foods as: cereal, oatmeal, yogurt, fruits, vegetables, creamed soups, eggs, bread, crackers, pureed foods in a blender, etc.   HELPFUL HINTS FOR DRINKING PREP SOLUTION:   Make sure prep is extremely cold. Mix and refrigerate the the morning of the prep. You may also put in the freezer.   You may try mixing some Crystal Light or Country Time Lemonade if you prefer. Mix in small amounts; add more if necessary.  Try drinking through a straw  Rinse mouth with water or  a mouthwash between glasses, to remove after-taste.  Try sipping on a cold beverage /ice/ popsicles between glasses of prep.  Place a piece of sugar-free hard candy in mouth between glasses.  If you become nauseated, try consuming smaller amounts, or stretch out the time between glasses. Stop for 30-60 minutes, then slowly start back drinking.        OTHER INSTRUCTIONS  You will need a responsible adult at least 51 years of age to accompany you and drive you home. This person must remain in the waiting room during your procedure. The hospital will cancel  your procedure if you do not have a responsible adult with you.   1. Wear loose fitting clothing that is easily removed. 2. Leave jewelry and other valuables at home.  3. Remove all body piercing jewelry and leave at home. 4. Total time from sign-in until discharge is approximately 2-3 hours. 5. You should go home directly after your procedure and rest. You can resume normal activities the day after your procedure. 6. The day of your procedure you should not:  Drive  Make legal decisions  Operate machinery  Drink alcohol  Return to work   You may call the office (Dept: [phone]) before 5:00pm, or page the doctor on call [phone]) after 5:00pm, for further instructions, if necessary.   Insurance Information YOU WILL NEED TO CHECK WITH YOUR INSURANCE COMPANY FOR THE BENEFITS OF COVERAGE YOU HAVE FOR THIS PROCEDURE.  UNFORTUNATELY, NOT ALL INSURANCE COMPANIES HAVE BENEFITS TO COVER ALL OR PART OF THESE TYPES OF PROCEDURES.  IT IS YOUR RESPONSIBILITY TO CHECK YOUR BENEFITS, HOWEVER, WE WILL BE GLAD TO ASSIST YOU WITH ANY CODES YOUR INSURANCE COMPANY MAY NEED.    PLEASE NOTE THAT MOST INSURANCE COMPANIES WILL NOT COVER A SCREENING COLONOSCOPY FOR PEOPLE UNDER THE AGE OF 50  IF YOU HAVE BCBS INSURANCE, YOU MAY HAVE BENEFITS FOR A SCREENING COLONOSCOPY BUT IF POLYPS ARE FOUND THE DIAGNOSIS WILL CHANGE AND THEN YOU MAY HAVE A DEDUCTIBLE THAT WILL NEED TO BE MET. SO PLEASE MAKE SURE YOU CHECK YOUR BENEFITS FOR A SCREENING COLONOSCOPY AS WELL AS A DIAGNOSTIC COLONOSCOPY.

## 2019-06-06 NOTE — Progress Notes (Signed)
Ok to schedule.

## 2019-07-12 ENCOUNTER — Emergency Department (HOSPITAL_COMMUNITY)
Admission: EM | Admit: 2019-07-12 | Discharge: 2019-07-12 | Disposition: A | Discharge status: Home / Self Care | Payer: BC Managed Care – PPO | Attending: Emergency Medicine | Admitting: Emergency Medicine

## 2019-07-12 ENCOUNTER — Other Ambulatory Visit: Payer: Self-pay

## 2019-07-12 DIAGNOSIS — Y92410 Unspecified street and highway as the place of occurrence of the external cause: Secondary | ICD-10-CM | POA: Diagnosis not present

## 2019-07-12 DIAGNOSIS — I10 Essential (primary) hypertension: Secondary | ICD-10-CM | POA: Insufficient documentation

## 2019-07-12 DIAGNOSIS — Y9389 Activity, other specified: Secondary | ICD-10-CM | POA: Diagnosis not present

## 2019-07-12 DIAGNOSIS — M542 Cervicalgia: Secondary | ICD-10-CM | POA: Diagnosis not present

## 2019-07-12 DIAGNOSIS — Y999 Unspecified external cause status: Secondary | ICD-10-CM | POA: Insufficient documentation

## 2019-07-12 DIAGNOSIS — M545 Low back pain: Secondary | ICD-10-CM | POA: Insufficient documentation

## 2019-07-12 DIAGNOSIS — Z79899 Other long term (current) drug therapy: Secondary | ICD-10-CM | POA: Insufficient documentation

## 2019-07-12 DIAGNOSIS — M549 Dorsalgia, unspecified: Secondary | ICD-10-CM | POA: Insufficient documentation

## 2019-07-12 MED ORDER — METHOCARBAMOL 500 MG PO TABS
500.0000 mg | ORAL_TABLET | Freq: Two times a day (BID) | ORAL | 0 refills | Status: DC
Start: 2019-07-12 — End: 2019-08-11

## 2019-07-12 MED ORDER — LIDOCAINE 5 % EX PTCH: 1.0000 | MEDICATED_PATCH | CUTANEOUS | 0 refills | Status: DC

## 2019-07-12 MED ORDER — IBUPROFEN 400 MG PO TABS
600.0000 mg | ORAL_TABLET | Freq: Once | ORAL | Status: AC
  Administered 2019-07-12: 600 mg via ORAL
  Filled 2019-07-12: qty 2
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
## 2019-07-12 NOTE — Discharge Instructions (Signed)
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
## 2019-07-15 ENCOUNTER — Other Ambulatory Visit: Payer: Self-pay

## 2019-07-15 ENCOUNTER — Ambulatory Visit (HOSPITAL_COMMUNITY)
Admission: RE | Admit: 2019-07-15 | Discharge: 2019-07-15 | Disposition: A | Discharge status: Home / Self Care | Payer: BC Managed Care – PPO | Source: Ambulatory Visit | Attending: Internal Medicine | Admitting: Internal Medicine

## 2019-07-15 ENCOUNTER — Other Ambulatory Visit (HOSPITAL_COMMUNITY): Payer: Self-pay | Admitting: Internal Medicine

## 2019-07-15 DIAGNOSIS — M545 Low back pain, unspecified: Secondary | ICD-10-CM

## 2019-07-15 DIAGNOSIS — M542 Cervicalgia: Secondary | ICD-10-CM | POA: Diagnosis not present

## 2019-07-15 DIAGNOSIS — S161XXA Strain of muscle, fascia and tendon at neck level, initial encounter: Secondary | ICD-10-CM | POA: Diagnosis not present

## 2019-07-15 DIAGNOSIS — M546 Pain in thoracic spine: Secondary | ICD-10-CM | POA: Diagnosis not present

## 2019-07-15 DIAGNOSIS — Z6839 Body mass index (BMI) 39.0-39.9, adult: Secondary | ICD-10-CM | POA: Diagnosis not present

## 2019-07-23 DIAGNOSIS — M542 Cervicalgia: Secondary | ICD-10-CM | POA: Diagnosis not present

## 2019-07-23 DIAGNOSIS — M545 Low back pain: Secondary | ICD-10-CM | POA: Diagnosis not present

## 2019-07-28 ENCOUNTER — Other Ambulatory Visit: Payer: Self-pay

## 2019-07-28 ENCOUNTER — Encounter (HOSPITAL_COMMUNITY): Payer: Self-pay | Admitting: Physical Therapy

## 2019-07-28 ENCOUNTER — Ambulatory Visit (HOSPITAL_COMMUNITY): Payer: BC Managed Care – PPO | Attending: Internal Medicine | Admitting: Physical Therapy

## 2019-07-28 DIAGNOSIS — M542 Cervicalgia: Secondary | ICD-10-CM

## 2019-07-28 DIAGNOSIS — M545 Low back pain, unspecified: Secondary | ICD-10-CM

## 2019-07-28 DIAGNOSIS — M6281 Muscle weakness (generalized): Secondary | ICD-10-CM | POA: Insufficient documentation
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
## 2019-07-29 ENCOUNTER — Other Ambulatory Visit (HOSPITAL_COMMUNITY): Payer: Self-pay | Admitting: Internal Medicine

## 2019-07-29 DIAGNOSIS — M25561 Pain in right knee: Secondary | ICD-10-CM

## 2019-07-30 ENCOUNTER — Encounter (HOSPITAL_COMMUNITY): Payer: BC Managed Care – PPO | Admitting: Physical Therapy

## 2019-07-30 ENCOUNTER — Ambulatory Visit (HOSPITAL_COMMUNITY)
Admission: RE | Admit: 2019-07-30 | Discharge: 2019-07-30 | Disposition: A | Discharge status: Home / Self Care | Payer: BC Managed Care – PPO | Source: Ambulatory Visit | Attending: Internal Medicine | Admitting: Internal Medicine

## 2019-07-30 ENCOUNTER — Other Ambulatory Visit: Payer: Self-pay

## 2019-07-30 DIAGNOSIS — M25561 Pain in right knee: Secondary | ICD-10-CM | POA: Insufficient documentation

## 2019-07-31 ENCOUNTER — Ambulatory Visit (HOSPITAL_COMMUNITY): Payer: BC Managed Care – PPO | Admitting: Physical Therapy

## 2019-07-31 DIAGNOSIS — M542 Cervicalgia: Secondary | ICD-10-CM | POA: Diagnosis not present

## 2019-07-31 DIAGNOSIS — M6281 Muscle weakness (generalized): Secondary | ICD-10-CM

## 2019-07-31 DIAGNOSIS — M545 Low back pain, unspecified: Secondary | ICD-10-CM

## 2019-07-31 NOTE — Therapy (Signed)
[place], [place], [address] Phone: [phone]   Fax:  [phone]  Physical Therapy Treatment  Patient Details  Name: Sonya Kline MRN: [id number] Date of Birth: 02-08-1971 Referring Provider (PT): Asencion Noble MD    Encounter Date: 07/31/2019   PT End of Session - 07/31/19 1215    Visit Number 2    Number of Visits 12    Date for PT Re-Evaluation 09/12/19    Authorization Type BCBS COMMM PPO    Authorization - Visit Number 2    Authorization - Number of Visits 60    Progress Note Due on Visit 10    PT Start Time 1962    PT Stop Time 1214    PT Time Calculation (min) 38 min    Activity Tolerance Patient tolerated treatment well;Patient limited by pain    Behavior During Therapy [facility] for tasks assessed/performed           Past Medical History:  Diagnosis Date  . Diverticulosis   . HTN (hypertension)     Past Surgical History:  Procedure Laterality Date  . ABDOMINAL HYSTERECTOMY    . CESAREAN SECTION     x 2    There were no vitals filed for this visit.   Subjective Assessment - 07/31/19 1204    Subjective Pt states her pain has not changed.  States she was not given any exercises to do but did walk some at the outlet this week.  States her back mostly hurts more than her neck.  STates prolonged immobility increases her pain.    Currently in Pain? Yes    Pain Score 7     Pain Location Back    Pain Descriptors / Indicators Aching;Sore                             [facility] Adult PT Treatment/Exercise - 07/31/19 0001      Lumbar Exercises: Standing   Other Standing Lumbar Exercises hip excursions 10 reps      Lumbar Exercises: Seated   Other Seated Lumbar Exercises scapular retractions 10 reps    Other Seated Lumbar Exercises cervical excursions 5 reps each way      Lumbar Exercises: Supine   Ab Set 10 reps;5 seconds    Clam 5 reps    Bridge 10 reps      Lumbar Exercises:  Prone   Straight Leg Raise 10 reps    Other Prone Lumbar Exercises heelsqueeze 10X5" holds    Other Prone Lumbar Exercises POE 2 minutes                  PT Education - 07/31/19 1214    Education Details goals and POC moving forward.  Initiated HEP    Person(s) Educated Patient    Methods Explanation;Demonstration;Tactile cues;Verbal cues;Handout    Comprehension Verbalized understanding;Returned demonstration;Verbal cues required;Tactile cues required;Need further instruction            PT Short Term Goals - 07/28/19 1626      PT SHORT TERM GOAL #1   Title Patient will be independent with initial HEP and self-management strategies to improve functional outcomes    Time 3    Period Weeks    Status New    Target Date 08/22/19      PT SHORT TERM GOAL #2   Title Patient will report at least 30% overall improvement in  subjective complaint to indicate improvement in ability to perform ADLs.    Time 3    Period Weeks    Status New    Target Date 08/22/19             PT Long Term Goals - 07/28/19 1626      PT LONG TERM GOAL #1   Title Patient will report at least 70% overall improvement in subjective complaint to indicate improvement in ability to perform ADLs.    Time 6    Period Weeks    Status New    Target Date 09/12/19      PT LONG TERM GOAL #2   Title Patient will have equal to or > 4+/5 MMT throughout BLEs to improve ability to perform functional mobility, stair ambulation and ADLs.    Time 6    Period Weeks    Status New    Target Date 09/12/19      PT LONG TERM GOAL #3   Title Patient will improve lumbar AROM by 25% in restricted plans to improve functional mobility and ability to perform ADLs.    Time 6    Period Weeks    Status New    Target Date 09/12/19      PT LONG TERM GOAL #4   Title Patient will have 3 day average pain not exceeding 3/10 globally with normal ADLs to indicate improved functional ability and quality of life.    Time 6     Period Weeks    Status New    Target Date 09/12/19                 Plan - 07/31/19 1216    Clinical Impression Statement Reveiwed goals and POC moving forward.  Initiated a HEP focusing primarily on ROM.  Pt able to demonstrate good ROM without pain behaviors, however voiced they hurt after completing.  Instructed to complete in reduced ROM until comfort improves.  Educated on core stability and began isometric strengthening.  did not add isometic abdominal to HEP as feel she needs more instruction.  Pt reported no change in symptoms at EOS.    Examination-Activity Limitations Lift;Stand;Locomotion Level;Bend;Squat;Carry;Sit    Examination-Participation Restrictions Yard Work;Community Activity;Laundry;Cleaning    Stability/Clinical Decision Making Stable/Uncomplicated    Rehab Potential Good    PT Frequency 2x / week    PT Duration 6 weeks    PT Treatment/Interventions ADLs/Self Care Home Management;Aquatic Therapy;Biofeedback;Cryotherapy;Electrical Stimulation;Contrast Bath;Fluidtherapy;Therapeutic activities;Parrafin;Therapeutic exercise;Orthotic Fit/Training;Patient/family education;Cognitive remediation;Functional mobility training;Ultrasound;Traction;Moist Heat;Stair training;Iontophoresis 4mg /ml Dexamethasone;Gait training;DME Instruction;Balance training;Scar mobilization;Passive range of motion;Neuromuscular re-education;Dry needling;Spinal Manipulations;Energy conservation;Manual techniques;Manual lymph drainage;Splinting;Compression bandaging;Taping;Vasopneumatic Device;Joint Manipulations    PT Next Visit Plan Add manual STM to address pain and restriction PRN. Next session add to  HEP: ab set, chin tuck, scap retraction. Progress as tolerated.    PT Home Exercise Plan 6/17:  hip excursions, cervical excursions    Consulted and Agree with Plan of Care Patient           Patient will benefit from skilled therapeutic intervention in order to improve the following deficits and  impairments:  Increased fascial restricitons, Improper body mechanics, Pain, Postural dysfunction, Decreased mobility, Decreased range of motion, Decreased strength, Hypomobility, Impaired flexibility  Visit Diagnosis: Acute low back pain, unspecified back pain laterality, unspecified whether sciatica present  Cervicalgia  Muscle weakness (generalized)     Problem List Patient Active Problem List   Diagnosis Date Noted  . Pain of upper abdomen   . Ileus (  [place]) 04/23/2018  . Labral tear of hip, degenerative 10/16/2013  . Ankle pain, left 05/03/2011  . DIVERTICULITIS, HX OF 03/09/2009   Teena Irani, PTA/CLT [phone]  Teena Irani 07/31/2019, 12:18 PM  [place] [address] [place], [place], [address] Phone: [phone]   Fax:  [phone]  Name: Sonya Kline MRN: [id number] Date of Birth: 07-20-1970

## 2019-08-05 ENCOUNTER — Encounter (HOSPITAL_COMMUNITY): Payer: Self-pay

## 2019-08-05 ENCOUNTER — Ambulatory Visit (HOSPITAL_COMMUNITY): Payer: BC Managed Care – PPO

## 2019-08-05 ENCOUNTER — Other Ambulatory Visit: Payer: Self-pay

## 2019-08-05 DIAGNOSIS — M6281 Muscle weakness (generalized): Secondary | ICD-10-CM | POA: Diagnosis not present

## 2019-08-05 DIAGNOSIS — M545 Low back pain, unspecified: Secondary | ICD-10-CM

## 2019-08-05 DIAGNOSIS — M542 Cervicalgia: Secondary | ICD-10-CM
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
## 2019-08-05 NOTE — Patient Instructions (Signed)
Isometric Abdominal Contraction    Tuck in stomach muscles and push low back against back of chair. Hold 5 seconds while counting out loud. Repeat 10 times. Do 2 sessions per day.  http://gt2.exer.us/624   Copyright  VHI. All rights reserved.   Axial Extension (Chin Tuck)    Pull chin in and lengthen back of neck. Hold 3 seconds while counting out loud. Repeat 10 times. Do 2 sessions per day.  http://gt2.exer.us/450   Copyright  VHI. All rights reserved.   Scapular Retraction (Standing)    With arms at sides, pinch shoulder blades together. Repeat 10 times per set. Do 2 sets per day  http://orth.exer.us/945   Copyright  VHI. All rights reserved.

## 2019-08-07 ENCOUNTER — Other Ambulatory Visit: Payer: Self-pay

## 2019-08-07 ENCOUNTER — Encounter (HOSPITAL_COMMUNITY): Payer: Self-pay

## 2019-08-07 ENCOUNTER — Ambulatory Visit (HOSPITAL_COMMUNITY): Payer: BC Managed Care – PPO

## 2019-08-07 DIAGNOSIS — M542 Cervicalgia: Secondary | ICD-10-CM | POA: Diagnosis not present

## 2019-08-07 DIAGNOSIS — M6281 Muscle weakness (generalized): Secondary | ICD-10-CM

## 2019-08-07 DIAGNOSIS — M545 Low back pain, unspecified: Secondary | ICD-10-CM
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
## 2019-08-08 ENCOUNTER — Ambulatory Visit
Admission: EM | Admit: 2019-08-08 | Discharge: 2019-08-08 | Disposition: A | Discharge status: Home / Self Care | Payer: BC Managed Care – PPO | Attending: Emergency Medicine | Admitting: Emergency Medicine

## 2019-08-08 DIAGNOSIS — R0981 Nasal congestion: Secondary | ICD-10-CM | POA: Insufficient documentation

## 2019-08-08 DIAGNOSIS — J029 Acute pharyngitis, unspecified: Secondary | ICD-10-CM | POA: Insufficient documentation

## 2019-08-08 LAB — POCT RAPID STREP A (OFFICE): Rapid Strep A Screen: NEGATIVE

## 2019-08-08 MED ORDER — CETIRIZINE-PSEUDOEPHEDRINE ER 5-120 MG PO TB12
1.0000 | ORAL_TABLET | Freq: Every day | ORAL | 0 refills | Status: DC
Start: 2019-08-08 — End: 2019-09-19

## 2019-08-08 NOTE — ED Provider Notes (Signed)
[place]   [id number] 08/08/19 Arrival Time: 4098  JX:BJYN THROAT  SUBJECTIVE: History from: patient.  Sonya Kline is a 49 y.o. female who presents with abrupt onset of sore throat x 1 day.  Denies sick exposure to strep, flu or mono, or precipitating event.  Has tried pain medication without relief.  Denies aggravating factors with swallowing.  Reports previous symptoms in the past.  Complains of associated congestion.  Denies fever, chills, fatigue, ear pain, sinus pain, rhinorrhea, cough, SOB, wheezing, chest pain, nausea, rash, changes in bowel or bladder habits.    ROS: As per HPI.  All other pertinent ROS negative.     Past Medical History:  Diagnosis Date  . Diverticulosis   . HTN (hypertension)    Past Surgical History:  Procedure Laterality Date  . ABDOMINAL HYSTERECTOMY    . CESAREAN SECTION     x 2   Allergies  Allergen Reactions  . Contrast Media [Iodinated Diagnostic Agents] Hives  . Prednisone Rash   No current facility-administered medications on file prior to encounter.   Current Outpatient Medications on File Prior to Encounter  Medication Sig Dispense Refill  . amLODipine (NORVASC) 10 MG tablet Take 10 mg by mouth daily.     [name] lidocaine (LIDODERM) 5 % Place 1 patch onto the skin daily. Remove & Discard patch within 12 hours or as directed by MD 30 patch 0  . methocarbamol (ROBAXIN) 500 MG tablet Take 1 tablet (500 mg total) by mouth 2 (two) times daily. 20 tablet 0   Social History   Socioeconomic History  . Marital status: Married    Spouse name: Not on file  . Number of children: Not on file  . Years of education: college  . Highest education level: Not on file  Occupational History  . Not on file  Tobacco Use  . Smoking status: Never Smoker  . Smokeless tobacco: Never Used  Substance and Sexual Activity  . Alcohol use: No  . Drug use: No  . Sexual activity: Yes    Birth control/protection: None, Surgical  Other Topics  Concern  . Not on file  Social History Narrative  . Not on file   Social Determinants of Health   Financial Resource Strain:   . Difficulty of Paying Living Expenses:   Food Insecurity:   . Worried About Charity fundraiser in the Last Year:   . Arboriculturist in the Last Year:   Transportation Needs:   . Film/video editor (Medical):   [name] Lack of Transportation (Non-Medical):   Physical Activity:   . Days of Exercise per Week:   . Minutes of Exercise per Session:   Stress:   . Feeling of Stress :   Social Connections:   . Frequency of Communication with Friends and Family:   . Frequency of Social Gatherings with Friends and Family:   . Attends Religious Services:   . Active Member of Clubs or Organizations:   . Attends Archivist Meetings:   [name] Marital Status:   Intimate Partner Violence:   . Fear of Current or Ex-Partner:   . Emotionally Abused:   [name] Physically Abused:   . Sexually Abused:    Family History  Family history unknown: Yes    OBJECTIVE:  Vitals:   08/08/19 0922  BP: (!) 143/86  Pulse: 100  Resp: 17  Temp: 98.1 F (36.7 C)  TempSrc: Oral  SpO2: 95%     General  appearance: alert; well-appearing, nontoxic, speaking in full sentences and managing own secretions HEENT: NCAT; Ears: EACs clear, TMs pearly gray with visible cone of light, without erythema; Eyes: PERRL, EOMI grossly; Nose: no obvious rhinorrhea; Throat: oropharynx clear, tonsils not enlarged or erythematous without white tonsillar exudates, uvula midline Neck: supple without LAD Lungs: CTA bilaterally without adventitious breath sounds; cough absent Heart: regular rate and rhythm.   Skin: warm and dry Psychological: alert and cooperative; normal mood and affect  LABS: Results for orders placed or performed during the hospital encounter of 08/08/19 (from the past 24 hour(s))  POCT rapid strep A     Status: None   Collection Time: 08/08/19  9:31 AM  Result Value Ref Range     Rapid Strep A Screen Negative Negative     ASSESSMENT & PLAN:  1. Sore throat   2. Nasal congestion    Strep test negative, will send out for culture and we will call you with results Symptoms most likely secondary to post-nasal drainage/ allergies Get plenty of rest and push fluids Zyrtec D prescribed. Use daily for symptomatic relief Flonase for nasal congestion Drink warm or cool liquids, use throat lozenges, or popsicles to help alleviate symptoms Take OTC ibuprofen or tylenol as needed for pain Follow up with PCP if symptoms persists Return or go to ER if patient has any new or worsening symptoms such as fever, chills, nausea, vomiting, worsening sore throat, cough, abdominal pain, chest pain, changes in bowel or bladder habits, etc...  Reviewed expectations re: course of current medical issues. Questions answered. Outlined signs and symptoms indicating need for more acute intervention. Patient verbalized understanding. After Visit Summary given.        Lestine Box, PA-C 08/08/19 [phone]

## 2019-08-08 NOTE — Discharge Instructions (Signed)
Strep test negative, will send out for culture and we will call you with results Symptoms most likely secondary to post-nasal drainage/ allergies Get plenty of rest and push fluids Zyrtec D prescribed. Use daily for symptomatic relief Flonase for nasal congestion Drink warm or cool liquids, use throat lozenges, or popsicles to help alleviate symptoms Take OTC ibuprofen or tylenol as needed for pain Follow up with PCP if symptoms persists Return or go to ER if patient has any new or worsening symptoms such as fever, chills, nausea, vomiting, worsening sore throat, cough, abdominal pain, chest pain, changes in bowel or bladder habits, etc..[name]

## 2019-08-08 NOTE — ED Triage Notes (Signed)
Provider triage  

## 2019-08-11 LAB — CULTURE, GROUP A STREP (THRC)

## 2019-08-12 ENCOUNTER — Ambulatory Visit (HOSPITAL_COMMUNITY): Payer: BC Managed Care – PPO | Admitting: Physical Therapy

## 2019-08-12 ENCOUNTER — Encounter (HOSPITAL_COMMUNITY): Payer: Self-pay | Admitting: Physical Therapy

## 2019-08-12 ENCOUNTER — Other Ambulatory Visit: Payer: Self-pay

## 2019-08-12 DIAGNOSIS — M6281 Muscle weakness (generalized): Secondary | ICD-10-CM | POA: Diagnosis not present

## 2019-08-12 DIAGNOSIS — M542 Cervicalgia: Secondary | ICD-10-CM

## 2019-08-12 DIAGNOSIS — M545 Low back pain, unspecified: Secondary | ICD-10-CM
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
## 2019-08-13 ENCOUNTER — Other Ambulatory Visit: Payer: Self-pay | Admitting: *Deleted

## 2019-08-13 ENCOUNTER — Other Ambulatory Visit: Payer: Self-pay

## 2019-08-13 ENCOUNTER — Telehealth: Payer: Self-pay | Admitting: *Deleted

## 2019-08-13 MED ORDER — PEG 3350-KCL-NA BICARB-NACL 420 G PO SOLR
4000.0000 mL | Freq: Once | ORAL | 0 refills | Status: AC
Start: 2019-08-13 — End: 2019-08-13

## 2019-08-13 NOTE — Telephone Encounter (Addendum)
Routing to [facility] (handling triages this week) due to LSL's absence.  Pt called in and requested to have her prep switched to [place].  Called [facility] to see if they had it in stock now and they confirmed that they do.  Informed [facility] rep that I would send electronic RX and fax prep instructions to pharmacy for pt to pick up.  She said that would be just fine.  Called pt back and informed her.  Pt voiced understanding.

## 2019-08-13 NOTE — Telephone Encounter (Signed)
Noted  

## 2019-08-14 DIAGNOSIS — M545 Low back pain: Secondary | ICD-10-CM | POA: Diagnosis not present

## 2019-08-15 ENCOUNTER — Encounter (HOSPITAL_COMMUNITY): Payer: Self-pay

## 2019-08-15 ENCOUNTER — Ambulatory Visit (HOSPITAL_COMMUNITY): Payer: BC Managed Care – PPO | Attending: Internal Medicine

## 2019-08-15 ENCOUNTER — Other Ambulatory Visit: Payer: Self-pay

## 2019-08-15 DIAGNOSIS — M6281 Muscle weakness (generalized): Secondary | ICD-10-CM | POA: Insufficient documentation

## 2019-08-15 DIAGNOSIS — M545 Low back pain, unspecified: Secondary | ICD-10-CM

## 2019-08-15 DIAGNOSIS — M542 Cervicalgia: Secondary | ICD-10-CM | POA: Insufficient documentation
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
## 2019-08-20 ENCOUNTER — Other Ambulatory Visit (HOSPITAL_COMMUNITY): Payer: BC Managed Care – PPO

## 2019-08-20 ENCOUNTER — Other Ambulatory Visit (HOSPITAL_COMMUNITY)
Admission: RE | Admit: 2019-08-20 | Discharge: 2019-08-20 | Disposition: A | Discharge status: Home / Self Care | Payer: BC Managed Care – PPO | Source: Ambulatory Visit | Attending: Internal Medicine | Admitting: Internal Medicine

## 2019-08-20 ENCOUNTER — Encounter (HOSPITAL_COMMUNITY): Payer: Self-pay | Admitting: Physical Therapy

## 2019-08-20 ENCOUNTER — Other Ambulatory Visit: Payer: Self-pay

## 2019-08-20 ENCOUNTER — Ambulatory Visit (HOSPITAL_COMMUNITY): Payer: BC Managed Care – PPO | Admitting: Physical Therapy

## 2019-08-20 DIAGNOSIS — Z20822 Contact with and (suspected) exposure to covid-19: Secondary | ICD-10-CM | POA: Insufficient documentation

## 2019-08-20 DIAGNOSIS — M545 Low back pain, unspecified: Secondary | ICD-10-CM

## 2019-08-20 DIAGNOSIS — Z01812 Encounter for preprocedural laboratory examination: Secondary | ICD-10-CM | POA: Diagnosis not present

## 2019-08-20 DIAGNOSIS — M542 Cervicalgia: Secondary | ICD-10-CM

## 2019-08-20 DIAGNOSIS — M6281 Muscle weakness (generalized): Secondary | ICD-10-CM | POA: Diagnosis not present
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
## 2019-08-21 LAB — SARS CORONAVIRUS 2 (TAT 6-24 HRS): SARS Coronavirus 2: NEGATIVE

## 2019-08-22 ENCOUNTER — Ambulatory Visit (HOSPITAL_COMMUNITY): Payer: BC Managed Care – PPO

## 2019-08-22 ENCOUNTER — Encounter (HOSPITAL_COMMUNITY): Payer: Self-pay | Admitting: Internal Medicine

## 2019-08-22 ENCOUNTER — Ambulatory Visit (HOSPITAL_COMMUNITY)
Admission: RE | Admit: 2019-08-22 | Discharge: 2019-08-22 | Disposition: A | Discharge status: Home / Self Care | Payer: BC Managed Care – PPO | Attending: Internal Medicine | Admitting: Internal Medicine

## 2019-08-22 ENCOUNTER — Encounter (HOSPITAL_COMMUNITY): Payer: Self-pay

## 2019-08-22 ENCOUNTER — Other Ambulatory Visit: Payer: Self-pay

## 2019-08-22 ENCOUNTER — Encounter (HOSPITAL_COMMUNITY)
Admission: RE | Disposition: A | Discharge status: Home / Self Care | Payer: Self-pay | Source: Home / Self Care | Attending: Internal Medicine

## 2019-08-22 DIAGNOSIS — K5730 Diverticulosis of large intestine without perforation or abscess without bleeding: Secondary | ICD-10-CM | POA: Diagnosis not present

## 2019-08-22 DIAGNOSIS — Z79899 Other long term (current) drug therapy: Secondary | ICD-10-CM | POA: Diagnosis not present

## 2019-08-22 DIAGNOSIS — M545 Low back pain, unspecified: Secondary | ICD-10-CM

## 2019-08-22 DIAGNOSIS — M6281 Muscle weakness (generalized): Secondary | ICD-10-CM

## 2019-08-22 DIAGNOSIS — M542 Cervicalgia: Secondary | ICD-10-CM

## 2019-08-22 DIAGNOSIS — K635 Polyp of colon: Secondary | ICD-10-CM

## 2019-08-22 DIAGNOSIS — I10 Essential (primary) hypertension: Secondary | ICD-10-CM | POA: Diagnosis not present

## 2019-08-22 DIAGNOSIS — Z888 Allergy status to other drugs, medicaments and biological substances status: Secondary | ICD-10-CM | POA: Insufficient documentation

## 2019-08-22 DIAGNOSIS — Z91041 Radiographic dye allergy status: Secondary | ICD-10-CM | POA: Diagnosis not present

## 2019-08-22 DIAGNOSIS — Z1211 Encounter for screening for malignant neoplasm of colon: Secondary | ICD-10-CM | POA: Insufficient documentation

## 2019-08-22 DIAGNOSIS — D123 Benign neoplasm of transverse colon: Secondary | ICD-10-CM | POA: Diagnosis not present

## 2019-08-22 DIAGNOSIS — Z139 Encounter for screening, unspecified: Secondary | ICD-10-CM | POA: Diagnosis not present

## 2019-08-22 HISTORY — PX: POLYPECTOMY: SHX5525

## 2019-08-22 HISTORY — PX: COLONOSCOPY: SHX5424

## 2019-08-22 SURGERY — COLONOSCOPY
Anesthesia: Moderate Sedation

## 2019-08-22 MED ORDER — STERILE WATER FOR IRRIGATION IR SOLN
Status: DC | PRN
  Administered 2019-08-22: 100 mL

## 2019-08-22 MED ORDER — MIDAZOLAM HCL 5 MG/5ML IJ SOLN
INTRAMUSCULAR | Status: DC | PRN
  Administered 2019-08-22 (×2): 1 mg via INTRAVENOUS
  Administered 2019-08-22 (×2): 2 mg via INTRAVENOUS

## 2019-08-22 MED ORDER — MIDAZOLAM HCL 5 MG/5ML IJ SOLN
INTRAMUSCULAR | Status: AC
  Filled 2019-08-22: qty 10

## 2019-08-22 MED ORDER — SODIUM CHLORIDE 0.9 % IV SOLN: INTRAVENOUS | Status: DC

## 2019-08-22 MED ORDER — ONDANSETRON HCL 4 MG/2ML IJ SOLN
INTRAMUSCULAR | Status: AC
  Filled 2019-08-22: qty 2

## 2019-08-22 MED ORDER — ONDANSETRON HCL 4 MG/2ML IJ SOLN
INTRAMUSCULAR | Status: DC | PRN
  Administered 2019-08-22: 4 mg via INTRAVENOUS

## 2019-08-22 MED ORDER — MEPERIDINE HCL 100 MG/ML IJ SOLN
INTRAMUSCULAR | Status: DC | PRN
  Administered 2019-08-22: 15 mg via INTRAVENOUS
  Administered 2019-08-22: 25 mg via INTRAVENOUS

## 2019-08-22 MED ORDER — MEPERIDINE HCL 50 MG/ML IJ SOLN
INTRAMUSCULAR | Status: AC
  Filled 2019-08-22: qty 1

## 2019-08-22 NOTE — Discharge Instructions (Signed)
Colonoscopy Discharge Instructions  Read the instructions outlined below and refer to this sheet in the next few weeks. These discharge instructions provide you with general information on caring for yourself after you leave the hospital. Your doctor may also give you specific instructions. While your treatment has been planned according to the most current medical practices available, unavoidable complications occasionally occur. If you have any problems or questions after discharge, call Dr. Gala Romney at [phone]. ACTIVITY  You may resume your regular activity, but move at a slower pace for the next 24 hours.   Take frequent rest periods for the next 24 hours.   Walking will help get rid of the air and reduce the bloated feeling in your belly (abdomen).   No driving for 24 hours (because of the medicine (anesthesia) used during the test).    Do not sign any important legal documents or operate any machinery for 24 hours (because of the anesthesia used during the test).  NUTRITION  Drink plenty of fluids.   You may resume your normal diet as instructed by your doctor.   Begin with a light meal and progress to your normal diet. Heavy or fried foods are harder to digest and may make you feel sick to your stomach (nauseated).   Avoid alcoholic beverages for 24 hours or as instructed.  MEDICATIONS  You may resume your normal medications unless your doctor tells you otherwise.  WHAT YOU CAN EXPECT TODAY  Some feelings of bloating in the abdomen.   Passage of more gas than usual.   Spotting of blood in your stool or on the toilet paper.  IF YOU HAD POLYPS REMOVED DURING THE COLONOSCOPY:  No aspirin products for 7 days or as instructed.   No alcohol for 7 days or as instructed.   Eat a soft diet for the next 24 hours.  FINDING OUT THE RESULTS OF YOUR TEST Not all test results are available during your visit. If your test results are not back during the visit, make an appointment  with your caregiver to find out the results. Do not assume everything is normal if you have not heard from your caregiver or the medical facility. It is important for you to follow up on all of your test results.  SEEK IMMEDIATE MEDICAL ATTENTION IF:  You have more than a spotting of blood in your stool.   Your belly is swollen (abdominal distention).   You are nauseated or vomiting.   You have a temperature over 101.   You have abdominal pain or discomfort that is severe or gets worse throughout the day.   Colon polyp and diverticulosis information provided  1 small polyp removed in your colon today  Other recommendations to follow pending review of pathology report  At patient request, I called Thomas at [phone] -reviewed results    Diverticulosis  Diverticulosis is a condition that develops when small pouches (diverticula) form in the wall of the large intestine (colon). The colon is where water is absorbed and stool (feces) is formed. The pouches form when the inside layer of the colon pushes through weak spots in the outer layers of the colon. You may have a few pouches or many of them. The pouches usually do not cause problems unless they become inflamed or infected. When this happens, the condition is called diverticulitis. What are the causes? The cause of this condition is not known. What increases the risk? The following factors may make you more likely to develop this  condition:  Being older than age 76. Your risk for this condition increases with age. Diverticulosis is rare among people younger than age 12. By age 18, many people have it.  Eating a low-fiber diet.  Having frequent constipation.  Being overweight.  Not getting enough exercise.  Smoking.  Taking over-the-counter pain medicines, like aspirin and ibuprofen.  Having a family history of diverticulosis. What are the signs or symptoms? In most people, there are no symptoms of this condition.  If you do have symptoms, they may include:  Bloating.  Cramps in the abdomen.  Constipation or diarrhea.  Pain in the lower left side of the abdomen. How is this diagnosed? Because diverticulosis usually has no symptoms, it is most often diagnosed during an exam for other colon problems. The condition may be diagnosed by:  Using a flexible scope to examine the colon (colonoscopy).  Taking an X-ray of the colon after dye has been put into the colon (barium enema).  Having a CT scan. How is this treated? You may not need treatment for this condition. Your health care provider may recommend treatment to prevent problems. You may need treatment if you have symptoms or if you previously had diverticulitis. Treatment may include:  Eating a high-fiber diet.  Taking a fiber supplement.  Taking a live bacteria supplement (probiotic).  Taking medicine to relax your colon. Follow these instructions at home: Medicines  Take over-the-counter and prescription medicines only as told by your health care provider.  If told by your health care provider, take a fiber supplement or probiotic. Constipation prevention Your condition may cause constipation. To prevent or treat constipation, you may need to:  Drink enough fluid to keep your urine pale yellow.  Take over-the-counter or prescription medicines.  Eat foods that are high in fiber, such as beans, whole grains, and fresh fruits and vegetables.  Limit foods that are high in fat and processed sugars, such as fried or sweet foods.  General instructions  Try not to strain when you have a bowel movement.  Keep all follow-up visits as told by your health care provider. This is important. Contact a health care provider if you:  Have pain in your abdomen.  Have bloating.  Have cramps.  Have not had a bowel movement in 3 days. Get help right away if:  Your pain gets worse.  Your bloating becomes very bad.  You have a fever or  chills, and your symptoms suddenly get worse.  You vomit.  You have bowel movements that are bloody or black.  You have bleeding from your rectum. Summary  Diverticulosis is a condition that develops when small pouches (diverticula) form in the wall of the large intestine (colon).  You may have a few pouches or many of them.  This condition is most often diagnosed during an exam for other colon problems.  Treatment may include increasing the fiber in your diet, taking supplements, or taking medicines. This information is not intended to replace advice given to you by your health care provider. Make sure you discuss any questions you have with your health care provider. Document Revised: 08/29/2018 Document Reviewed: 08/29/2018 Elsevier Patient Education  [place].    Colon Polyps  Polyps are tissue growths inside the body. Polyps can grow in many places, including the large intestine (colon). A polyp may be a round bump or a mushroom-shaped growth. You could have one polyp or several. Most colon polyps are noncancerous (benign). However, some colon polyps can  become cancerous over time. Finding and removing the polyps early can help prevent this. What are the causes? The exact cause of colon polyps is not known. What increases the risk? You are more likely to develop this condition if you:  Have a family history of colon cancer or colon polyps.  Are older than 38 or older than 45 if you are African American.  Have inflammatory bowel disease, such as ulcerative colitis or Crohn's disease.  Have certain hereditary conditions, such as: ? Familial adenomatous polyposis. ? Lynch syndrome. ? Turcot syndrome. ? Peutz-Jeghers syndrome.  Are overweight.  Smoke cigarettes.  Do not get enough exercise.  Drink too much alcohol.  Eat a diet that is high in fat and red meat and low in fiber.  Had childhood cancer that was treated with abdominal radiation. What are  the signs or symptoms? Most polyps do not cause symptoms. If you have symptoms, they may include:  Blood coming from your rectum when having a bowel movement.  Blood in your stool. The stool may look dark red or black.  Abdominal pain.  A change in bowel habits, such as constipation or diarrhea. How is this diagnosed? This condition is diagnosed with a colonoscopy. This is a procedure in which a lighted, flexible scope is inserted into the anus and then passed into the colon to examine the area. Polyps are sometimes found when a colonoscopy is done as part of routine cancer screening tests. How is this treated? Treatment for this condition involves removing any polyps that are found. Most polyps can be removed during a colonoscopy. Those polyps will then be tested for cancer. Additional treatment may be needed depending on the results of testing. Follow these instructions at home: Lifestyle  Maintain a healthy weight, or lose weight if recommended by your health care provider.  Exercise every day or as told by your health care provider.  Do not use any products that contain nicotine or tobacco, such as cigarettes and e-cigarettes. If you need help quitting, ask your health care provider.  If you drink alcohol, limit how much you have: ? 0-1 drink a day for women. ? 0-2 drinks a day for men.  Be aware of how much alcohol is in your drink. In the [place] one drink equals one 12 oz bottle of beer (355 mL), one 5 oz glass of wine (148 mL), or one 1 oz shot of hard liquor (44 mL). Eating and drinking   Eat foods that are high in fiber, such as fruits, vegetables, and whole grains.  Eat foods that are high in calcium and vitamin D, such as milk, cheese, yogurt, eggs, liver, fish, and broccoli.  Limit foods that are high in fat, such as fried foods and desserts.  Limit the amount of red meat and processed meat you eat, such as hot dogs, sausage, bacon, and lunch meats. General  instructions  Keep all follow-up visits as told by your health care provider. This is important. ? This includes having regularly scheduled colonoscopies. ? Talk to your health care provider about when you need a colonoscopy. Contact a health care provider if:  You have new or worsening bleeding during a bowel movement.  You have new or increased blood in your stool.  You have a change in bowel habits.  You lose weight for no known reason. Summary  Polyps are tissue growths inside the body. Polyps can grow in many places, including the colon.  Most colon polyps are noncancerous (benign),  but some can become cancerous over time.  This condition is diagnosed with a colonoscopy.  Treatment for this condition involves removing any polyps that are found. Most polyps can be removed during a colonoscopy. This information is not intended to replace advice given to you by your health care provider. Make sure you discuss any questions you have with your health care provider. Document Revised: 05/17/2017 Document Reviewed: 05/17/2017 Elsevier Patient Education  [place].

## 2019-08-22 NOTE — Op Note (Signed)
[facility] Patient Name: Sonya Kline Procedure Date: 08/22/2019 9:13 AM MRN: [id number] Date of Birth: 09/21/1970 Attending MD: Norvel Richards , MD CSN: [id number] Age: 49 Admit Type: Outpatient Procedure:                Colonoscopy Indications:              Screening for colorectal malignant neoplasm Providers:                Norvel Richards, MD, Rosina Lowenstein, RN, Raphael Gibney, Technician Referring MD:              Medicines:                Midazolam 6 mg IV, Meperidine 40 mg IV, Ondansetron                            4 mg IV Complications:            No immediate complications. Estimated Blood Loss:     Estimated blood loss was minimal. Procedure:                Pre-Anesthesia Assessment:                           - Prior to the procedure, a History and Physical                            was performed, and patient medications and                            allergies were reviewed. The patient's tolerance of                            previous anesthesia was also reviewed. The risks                            and benefits of the procedure and the sedation                            options and risks were discussed with the patient.                            All questions were answered, and informed consent                            was obtained. Prior Anticoagulants: The patient has                            taken no previous anticoagulant or antiplatelet                            agents. ASA Grade Assessment: II - A patient with  mild systemic disease. After reviewing the risks                            and benefits, the patient was deemed in                            satisfactory condition to undergo the procedure.                           After obtaining informed consent, the colonoscope                            was passed under direct vision. Throughout the                            procedure, the  patient's blood pressure, pulse, and                            oxygen saturations were monitored continuously. The                            CF-HQ190L ([id number]) scope was introduced through                            the anus and advanced to the the cecum, identified                            by appendiceal orifice and ileocecal valve. The                            colonoscopy was performed without difficulty. The                            patient tolerated the procedure well. The quality                            of the bowel preparation was adequate. Scope In: 9:32:53 AM Scope Out: 9:49:37 AM Scope Withdrawal Time: 0 hours 11 minutes 30 seconds  Total Procedure Duration: 0 hours 16 minutes 44 seconds  Findings:      The perianal and digital rectal examinations were normal.      A 4 mm polyp was found in the hepatic flexure. The polyp was sessile.       The polyp was removed with a cold snare. Resection and retrieval were       complete. Estimated blood loss was minimal.      Multiple small and large-mouthed diverticula were found in the entire       colon.      The exam was otherwise without abnormality on direct and retroflexion       views. Impression:               - One 4 mm polyp at the hepatic flexure, removed                            with a  cold snare. Resected and retrieved.                           - Diverticulosis in the entire examined colon.                           - The examination was otherwise normal on direct                            and retroflexion views. Moderate Sedation:      Moderate (conscious) sedation was administered by the endoscopy nurse       and supervised by the endoscopist. The following parameters were       monitored: oxygen saturation, heart rate, blood pressure, respiratory       rate, EKG, adequacy of pulmonary ventilation, and response to care.       Total physician intraservice time was 22 minutes. Recommendation:           -  Patient has a contact number available for                            emergencies. The signs and symptoms of potential                            delayed complications were discussed with the                            patient. Return to normal activities tomorrow.                            Written discharge instructions were provided to the                            patient.                           - Resume previous diet.                           - Continue present medications.                           - Repeat colonoscopy date to be determined after                            pending pathology results are reviewed for                            surveillance.                           - Return to GI office (date not yet determined). Procedure Code(s):        --- Professional ---                           [phone], Colonoscopy, flexible; with removal of  tumor(s), polyp(s), or other lesion(s) by snare                            technique                           G0500, Moderate sedation services provided by the                            same physician or other qualified health care                            professional performing a gastrointestinal                            endoscopic service that sedation supports,                            requiring the presence of an independent trained                            observer to assist in the monitoring of the                            patient's level of consciousness and physiological                            status; initial 15 minutes of intra-service time;                            patient age 20 years or older (additional time may                            be reported with [phone], as appropriate) Diagnosis Code(s):        --- Professional ---                           Z12.11, Encounter for screening for malignant                            neoplasm of colon                           K63.5, Polyp  of colon                           K57.30, Diverticulosis of large intestine without                            perforation or abscess without bleeding CPT copyright 2019 American Medical Association. All rights reserved. The codes documented in this report are preliminary and upon coder review may  be revised to meet current compliance requirements. Cristopher Estimable. , MD Norvel Richards, MD 08/22/2019 10:02:14 AM This report has been signed electronically. Number of Addenda: 0
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
## 2019-08-22 NOTE — H&P (Signed)
@LOGO @   Primary Care Physician:  Asencion Noble, MD Primary Gastroenterologist:  Dr. Gala Romney  Pre-Procedure History & Physical: HPI:  Sonya Kline is a 49 y.o. female is here for a screening colonoscopy.  No bowel symptoms.  No family history colon cancer.  Negative colonoscopy (diverticulosis) 2011.  Past Medical History:  Diagnosis Date  . Diverticulosis   . HTN (hypertension)     Past Surgical History:  Procedure Laterality Date  . ABDOMINAL HYSTERECTOMY    . CESAREAN SECTION     x 2    Prior to Admission medications   Medication Sig Start Date End Date Taking? Authorizing Provider  amLODipine (NORVASC) 10 MG tablet Take 10 mg by mouth daily.    Yes [provider]  cetirizine-pseudoephedrine (ZYRTEC-D) 5-120 MG tablet Take 1 tablet by mouth daily. Patient not taking: Reported on 08/11/2019 08/08/19   Wurst, [place], PA-C  lidocaine (LIDODERM) 5 % Place 1 patch onto the skin daily. Remove & Discard patch within 12 hours or as directed by MD Patient not taking: Reported on 08/11/2019 07/12/19   Arlean Hopping C, PA-C    Allergies as of 06/06/2019 - Review Complete 06/05/2019  Allergen Reaction Noted  . Contrast media [iodinated diagnostic agents] Hives 11/21/2011  . Prednisone Rash 12/20/2013    Family History  Family history unknown: Yes    Social History   Socioeconomic History  . Marital status: Married    Spouse name: Not on file  . Number of children: Not on file  . Years of education: college  . Highest education level: Not on file  Occupational History  . Not on file  Tobacco Use  . Smoking status: Never Smoker  . Smokeless tobacco: Never Used  Vaping Use  . Vaping Use: Never used  Substance and Sexual Activity  . Alcohol use: No  . Drug use: No  . Sexual activity: Yes    Birth control/protection: None, Surgical  Other Topics Concern  . Not on file  Social History Narrative  . Not on file   Social Determinants of Health   Financial Resource  Strain:   . Difficulty of Paying Living Expenses:   Food Insecurity:   . Worried About Charity fundraiser in the Last Year:   . Arboriculturist in the Last Year:   Transportation Needs:   . Film/video editor (Medical):   [name] Lack of Transportation (Non-Medical):   Physical Activity:   . Days of Exercise per Week:   . Minutes of Exercise per Session:   Stress:   . Feeling of Stress :   Social Connections:   . Frequency of Communication with Friends and Family:   . Frequency of Social Gatherings with Friends and Family:   . Attends Religious Services:   . Active Member of Clubs or Organizations:   . Attends Archivist Meetings:   [name] Marital Status:   Intimate Partner Violence:   . Fear of Current or Ex-Partner:   . Emotionally Abused:   [name] Physically Abused:   . Sexually Abused:     Review of Systems: See HPI, otherwise negative ROS  Physical Exam: BP 122/84   Pulse 90   Temp 98.3 F (36.8 C) (Oral)   Resp 14   Ht 5\' 2"  (1.575 m)   LMP 11/14/2011   SpO2 96%   BMI 41.70 kg/m  General:   Alert,  Well-developed, well-nourished, pleasant and cooperative in NAD HLungs:  Clear throughout to auscultation.  No wheezes, crackles, or rhonchi. No acute distress. Heart:  Regular rate and rhythm; no murmurs, clicks, rubs,  or gallops. Abdomen:  Soft, nontender and nondistended. No masses, hepatosplenomegaly or hernias noted. Normal bowel sounds, without guarding, and without rebound.     Impression/Plan: Sonya Kline is now here to undergo a screening colonoscopy.  Average rescreening examination.  Risks, benefits, limitations, imponderables and alternatives regarding colonoscopy have been reviewed with the patient. Questions have been answered. All parties agreeable.     Notice:  This dictation was prepared with Dragon dictation along with smaller phrase technology. Any transcriptional errors that result from this process are unintentional and may not be  corrected upon review.

## 2019-08-25 LAB — SURGICAL PATHOLOGY

## 2019-08-26 ENCOUNTER — Other Ambulatory Visit: Payer: Self-pay

## 2019-08-26 ENCOUNTER — Ambulatory Visit (HOSPITAL_COMMUNITY): Payer: BC Managed Care – PPO | Admitting: Physical Therapy

## 2019-08-26 ENCOUNTER — Encounter (HOSPITAL_COMMUNITY): Payer: Self-pay | Admitting: Physical Therapy

## 2019-08-26 ENCOUNTER — Encounter: Payer: Self-pay | Admitting: Internal Medicine

## 2019-08-26 DIAGNOSIS — M545 Low back pain, unspecified: Secondary | ICD-10-CM

## 2019-08-26 DIAGNOSIS — M6281 Muscle weakness (generalized): Secondary | ICD-10-CM

## 2019-08-26 DIAGNOSIS — M542 Cervicalgia: Secondary | ICD-10-CM
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
## 2019-08-28 ENCOUNTER — Other Ambulatory Visit: Payer: Self-pay

## 2019-08-28 ENCOUNTER — Ambulatory Visit (HOSPITAL_COMMUNITY): Payer: BC Managed Care – PPO | Admitting: Physical Therapy

## 2019-08-28 DIAGNOSIS — M542 Cervicalgia: Secondary | ICD-10-CM

## 2019-08-28 DIAGNOSIS — M545 Low back pain, unspecified: Secondary | ICD-10-CM

## 2019-08-28 DIAGNOSIS — M6281 Muscle weakness (generalized): Secondary | ICD-10-CM

## 2019-08-28 NOTE — Addendum Note (Signed)
Addended by: Josue Hector A on: 08/28/2019 12:10 PM   Modules accepted: Orders
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
## 2019-08-29 ENCOUNTER — Encounter (HOSPITAL_COMMUNITY): Payer: Self-pay | Admitting: Internal Medicine

## 2019-09-02 ENCOUNTER — Encounter (HOSPITAL_COMMUNITY): Payer: Self-pay | Admitting: Physical Therapy

## 2019-09-02 ENCOUNTER — Other Ambulatory Visit: Payer: Self-pay

## 2019-09-02 ENCOUNTER — Ambulatory Visit (HOSPITAL_COMMUNITY): Payer: BC Managed Care – PPO | Admitting: Physical Therapy

## 2019-09-02 DIAGNOSIS — M542 Cervicalgia: Secondary | ICD-10-CM

## 2019-09-02 DIAGNOSIS — M6281 Muscle weakness (generalized): Secondary | ICD-10-CM | POA: Diagnosis not present

## 2019-09-02 DIAGNOSIS — M545 Low back pain, unspecified: Secondary | ICD-10-CM

## 2019-09-02 NOTE — Therapy (Signed)
[place], [place], [address] Phone: [phone]   Fax:  [phone]  Physical Therapy Treatment  Patient Details  Name: Sonya Kline MRN: [id number] Date of Birth: 1970-04-23 Referring Provider (PT): Asencion Noble MD    Encounter Date: 09/02/2019   PT End of Session - 09/02/19 1000    Visit Number 11    Number of Visits 18    Date for PT Re-Evaluation 09/26/19    Authorization Type [employer] COMMM PPO    Authorization - Visit Number 11    Authorization - Number of Visits 60    Progress Note Due on Visit 20    PT Start Time 0956    PT Stop Time 1035    PT Time Calculation (min) 39 min    Activity Tolerance Patient tolerated treatment well    Behavior During Therapy [facility] for tasks assessed/performed           Past Medical History:  Diagnosis Date   Diverticulosis    HTN (hypertension)     Past Surgical History:  Procedure Laterality Date   ABDOMINAL HYSTERECTOMY     CESAREAN SECTION     x 2   COLONOSCOPY N/A 08/22/2019   Procedure: COLONOSCOPY;  Surgeon: Daneil Dolin, MD;  [facility];  Service: Endoscopy;  Laterality: N/A;  9:30   POLYPECTOMY  08/22/2019   Procedure: POLYPECTOMY;  Surgeon: Daneil Dolin, MD;  [facility];  Service: Endoscopy;;    There were no vitals filed for this visit.   Subjective Assessment - 09/02/19 0958    Subjective Patient reports no new issues. Says she feels pretty good "about the same, but pretty good". Says her back was giving her some trouble on Monday night causing her difficulty to sleep.    Currently in Pain? Yes    Pain Score 5     Pain Location Back    Pain Orientation Mid;Lower    Pain Descriptors / Indicators Aching    Pain Type Acute pain    Pain Onset More than a month ago    Pain Frequency Constant                             [facility] Adult PT Treatment/Exercise - 09/02/19 0001      Lumbar Exercises: Standing    Row Theraband;20 reps    Theraband Level (Row) Level 3 (Green)    Shoulder Extension Theraband;20 reps    Theraband Level (Shoulder Extension) Level 3 (Green)    Other Standing Lumbar Exercises palloff press GTB x 15 each       Lumbar Exercises: Seated   Sit to Stand 20 reps      Lumbar Exercises: Supine   Ab Set 10 reps;5 seconds    Bent Knee Raise 20 reps    Dead Bug 10 reps    Bridge 10 reps    Straight Leg Raise 10 reps    Straight Leg Raises Limitations with ab set       Manual Therapy   Manual Therapy Soft tissue mobilization    Manual therapy comments Manual complete separate than rest of tx    Soft tissue mobilization IASTM to bilateral cervical paraspinals and UT in seated, RT lumbar paraspinals with patient in prone                     PT Short  Term Goals - 08/28/19 1008      PT SHORT TERM GOAL #1   Title Patient will be independent with initial HEP and self-management strategies to improve functional outcomes    Time 3    Period Weeks    Status Achieved    Target Date 08/22/19      PT SHORT TERM GOAL #2   Title Patient will report at least 30% overall improvement in subjective complaint to indicate improvement in ability to perform ADLs.    Time 3    Period Weeks    Status Achieved    Target Date 08/22/19             PT Long Term Goals - 08/28/19 1009      PT LONG TERM GOAL #1   Title Patient will report at least 70% overall improvement in subjective complaint to indicate improvement in ability to perform ADLs.    Time 6    Period Weeks    Status On-going      PT LONG TERM GOAL #2   Title Patient will have equal to or > 4+/5 MMT throughout BLEs to improve ability to perform functional mobility, stair ambulation and ADLs.    Time 6    Period Weeks    Status Partially Met      PT LONG TERM GOAL #3   Title Patient will improve lumbar AROM by 25% in restricted plans to improve functional mobility and ability to perform ADLs.    Time 6     Period Weeks    Status Partially Met      PT LONG TERM GOAL #4   Title Patient will have 3 day average pain not exceeding 3/10 globally with normal ADLs to indicate improved functional ability and quality of life.    Time 6    Period Weeks    Status On-going                 Plan - 09/02/19 1051    Clinical Impression Statement Patient tolerated session well today with no increased complaint of pain. Progressed core and LE strengthening with sit to stands and palloff pressing. Progressed core strengthening with added deadbugs. Patient required verbal cues for proper form. Patient notes decreased lumbar pain post manual treatment and notes improved cervical AROM. Patient will continue to benefit from skilled therapy services to progress core and postural strengthening to reduce pain and improve LOF with ADLs.    Examination-Activity Limitations Lift;Stand;Locomotion Level;Bend;Squat;Carry;Sit    Examination-Participation Restrictions Yard Work;Community Activity;Laundry;Cleaning    Stability/Clinical Decision Making Stable/Uncomplicated    Rehab Potential Good    PT Frequency 2x / week    PT Duration 4 weeks    PT Treatment/Interventions ADLs/Self Care Home Management;Aquatic Therapy;Biofeedback;Cryotherapy;Electrical Stimulation;Contrast Bath;Fluidtherapy;Therapeutic activities;Parrafin;Therapeutic exercise;Orthotic Fit/Training;Patient/family education;Cognitive remediation;Functional mobility training;Ultrasound;Traction;Moist Heat;Stair training;Iontophoresis 74m/ml Dexamethasone;Gait training;DME Instruction;Balance training;Scar mobilization;Passive range of motion;Neuromuscular re-education;Dry needling;Spinal Manipulations;Energy conservation;Manual techniques;Manual lymph drainage;Splinting;Compression bandaging;Taping;Vasopneumatic Device;Joint Manipulations    PT Next Visit Plan Progress functional strengthening with proper lifting, postural strengthening and mobility for pain  control.    PT Home Exercise Plan 6/17:  hip excursions, cervical excursions; 6/22: ab set, chin tuck, scap retraction. 08/12/19: cervical SB isometrics 08/26/19: tband rows and extension    Consulted and Agree with Plan of Care Patient           Patient will benefit from skilled therapeutic intervention in order to improve the following deficits and impairments:  Increased fascial restricitons, Improper body mechanics, Pain, Postural  dysfunction, Decreased mobility, Decreased range of motion, Decreased strength, Hypomobility, Impaired flexibility  Visit Diagnosis: Muscle weakness (generalized)  Acute low back pain, unspecified back pain laterality, unspecified whether sciatica present  Cervicalgia     Problem List Patient Active Problem List   Diagnosis Date Noted   Pain of upper abdomen    Ileus ([place]) 04/23/2018   Labral tear of hip, degenerative 10/16/2013   Ankle pain, left 05/03/2011   DIVERTICULITIS, HX OF 03/09/2009    10:56 AM, 09/02/19 Josue Hector PT DPT  Physical Therapist with [facility]  [phone] [place], [place], [address] Phone: [phone]   Fax:  [phone]  Name: Sonya Kline MRN: [id number] Date of Birth: 23-Apr-1970

## 2019-09-04 ENCOUNTER — Other Ambulatory Visit: Payer: Self-pay

## 2019-09-04 ENCOUNTER — Encounter (HOSPITAL_COMMUNITY): Payer: Self-pay | Admitting: Physical Therapy

## 2019-09-04 ENCOUNTER — Ambulatory Visit (HOSPITAL_COMMUNITY): Payer: BC Managed Care – PPO | Admitting: Physical Therapy

## 2019-09-04 DIAGNOSIS — M542 Cervicalgia: Secondary | ICD-10-CM

## 2019-09-04 DIAGNOSIS — M6281 Muscle weakness (generalized): Secondary | ICD-10-CM

## 2019-09-04 DIAGNOSIS — M545 Low back pain, unspecified: Secondary | ICD-10-CM

## 2019-09-04 NOTE — Therapy (Signed)
[place], [place], [address] Phone: [phone]   Fax:  [phone]  Physical Therapy Treatment  Patient Details  Name: Sonya Kline MRN: [id number] Date of Birth: 05/16/1970 Referring Provider (PT): Asencion Noble MD    Encounter Date: 09/04/2019   PT End of Session - 09/04/19 0956    Visit Number 12    Number of Visits 18    Date for PT Re-Evaluation 09/26/19    Authorization Type [employer] COMMM PPO    Authorization - Visit Number 12    Authorization - Number of Visits 60    Progress Note Due on Visit 20    PT Start Time 0951    PT Stop Time 1029    PT Time Calculation (min) 38 min    Activity Tolerance Patient tolerated treatment well    Behavior During Therapy [facility] for tasks assessed/performed           Past Medical History:  Diagnosis Date   Diverticulosis    HTN (hypertension)     Past Surgical History:  Procedure Laterality Date   ABDOMINAL HYSTERECTOMY     CESAREAN SECTION     x 2   COLONOSCOPY N/A 08/22/2019   Procedure: COLONOSCOPY;  Surgeon: Daneil Dolin, MD;  [facility];  Service: Endoscopy;  Laterality: N/A;  9:30   POLYPECTOMY  08/22/2019   Procedure: POLYPECTOMY;  Surgeon: Daneil Dolin, MD;  [facility];  Service: Endoscopy;;    There were no vitals filed for this visit.   Subjective Assessment - 09/04/19 0955    Subjective Patient says her back is stiff this morning. Says she did some stretching this AM but still feels a little stiff.    Currently in Pain? Yes    Pain Score 6     Pain Location Back    Pain Orientation Lower;Posterior    Pain Descriptors / Indicators Aching;Tightness    Pain Type Acute pain    Pain Onset More than a month ago                             [facility] Adult PT Treatment/Exercise - 09/04/19 0001      Lumbar Exercises: Stretches   Lower Trunk Rotation 5 reps;10 seconds      Lumbar Exercises: Machines for  Strengthening   Other Lumbar Machine Exercise machine walkout 30# x 10       Lumbar Exercises: Standing   Other Standing Lumbar Exercises palloff press GTB x 15 each in semi tandem stance     Other Standing Lumbar Exercises sidestepping 2RT       Lumbar Exercises: Seated   Sit to Stand 20 reps      Lumbar Exercises: Supine   Bent Knee Raise 20 reps    Dead Bug 10 reps    Bridge 10 reps    Straight Leg Raise 10 reps    Straight Leg Raises Limitations with ab set       Manual Therapy   Manual Therapy Soft tissue mobilization    Manual therapy comments Manual complete separate than rest of tx    Soft tissue mobilization IASTM to bilateral lumbar paraspinals with patient in prone                     PT Short Term Goals - 08/28/19 1008      PT SHORT  TERM GOAL #1   Title Patient will be independent with initial HEP and self-management strategies to improve functional outcomes    Time 3    Period Weeks    Status Achieved    Target Date 08/22/19      PT SHORT TERM GOAL #2   Title Patient will report at least 30% overall improvement in subjective complaint to indicate improvement in ability to perform ADLs.    Time 3    Period Weeks    Status Achieved    Target Date 08/22/19             PT Long Term Goals - 08/28/19 1009      PT LONG TERM GOAL #1   Title Patient will report at least 70% overall improvement in subjective complaint to indicate improvement in ability to perform ADLs.    Time 6    Period Weeks    Status On-going      PT LONG TERM GOAL #2   Title Patient will have equal to or > 4+/5 MMT throughout BLEs to improve ability to perform functional mobility, stair ambulation and ADLs.    Time 6    Period Weeks    Status Partially Met      PT LONG TERM GOAL #3   Title Patient will improve lumbar AROM by 25% in restricted plans to improve functional mobility and ability to perform ADLs.    Time 6    Period Weeks    Status Partially Met      PT  LONG TERM GOAL #4   Title Patient will have 3 day average pain not exceeding 3/10 globally with normal ADLs to indicate improved functional ability and quality of life.    Time 6    Period Weeks    Status On-going                 Plan - 09/04/19 1029    Clinical Impression Statement Patient tolerated ther ex progressions well today with no increased complaint of pain. Added LTR stretching at beginning of session to address complaint of stiffness. Patient reported improvement with this. Added palloff press in tandem and machine walkouts for core strength progressions. Patient educated on proper form and cued on engaging TA during activity. Focused manual treatment on lumbar region today per patient subjective complaint. Patient reports noted pain reduction post treatment. Will continue to benefit from skilled therapy services to progress core and postural strengthening to reduce pain and improve overall LOF.    Examination-Activity Limitations Lift;Stand;Locomotion Level;Bend;Squat;Carry;Sit    Examination-Participation Restrictions Yard Work;Community Activity;Laundry;Cleaning    Stability/Clinical Decision Making Stable/Uncomplicated    Rehab Potential Good    PT Frequency 2x / week    PT Duration 4 weeks    PT Treatment/Interventions ADLs/Self Care Home Management;Aquatic Therapy;Biofeedback;Cryotherapy;Electrical Stimulation;Contrast Bath;Fluidtherapy;Therapeutic activities;Parrafin;Therapeutic exercise;Orthotic Fit/Training;Patient/family education;Cognitive remediation;Functional mobility training;Ultrasound;Traction;Moist Heat;Stair training;Iontophoresis 30m/ml Dexamethasone;Gait training;DME Instruction;Balance training;Scar mobilization;Passive range of motion;Neuromuscular re-education;Dry needling;Spinal Manipulations;Energy conservation;Manual techniques;Manual lymph drainage;Splinting;Compression bandaging;Taping;Vasopneumatic Device;Joint Manipulations    PT Next Visit Plan  Progress functional strengthening with proper lifting, postural strengthening and mobility for pain control. Add band to sidestepping    PT Home Exercise Plan 6/17:  hip excursions, cervical excursions; 6/22: ab set, chin tuck, scap retraction. 08/12/19: cervical SB isometrics 08/26/19: tband rows and extension    Consulted and Agree with Plan of Care Patient           Patient will benefit from skilled therapeutic intervention in order to improve the  following deficits and impairments:  Increased fascial restricitons, Improper body mechanics, Pain, Postural dysfunction, Decreased mobility, Decreased range of motion, Decreased strength, Hypomobility, Impaired flexibility  Visit Diagnosis: Muscle weakness (generalized)  Acute low back pain, unspecified back pain laterality, unspecified whether sciatica present  Cervicalgia     Problem List Patient Active Problem List   Diagnosis Date Noted   Pain of upper abdomen    Ileus ([place]) 04/23/2018   Labral tear of hip, degenerative 10/16/2013   Ankle pain, left 05/03/2011   DIVERTICULITIS, HX OF 03/09/2009    11:53 AM, 09/04/19 Josue Hector PT DPT  Physical Therapist with [facility]  [phone] [place], [place], [address] Phone: [phone]   Fax:  [phone]  Name: Sonya Kline MRN: [id number] Date of Birth: [birth year]

## 2019-09-08 ENCOUNTER — Ambulatory Visit (HOSPITAL_COMMUNITY): Payer: BC Managed Care – PPO | Admitting: Physical Therapy

## 2019-09-08 ENCOUNTER — Other Ambulatory Visit: Payer: Self-pay

## 2019-09-08 DIAGNOSIS — M542 Cervicalgia: Secondary | ICD-10-CM | POA: Diagnosis not present

## 2019-09-08 DIAGNOSIS — M6281 Muscle weakness (generalized): Secondary | ICD-10-CM | POA: Diagnosis not present

## 2019-09-08 DIAGNOSIS — M545 Low back pain, unspecified: Secondary | ICD-10-CM

## 2019-09-08 NOTE — Therapy (Signed)
[place], [place], [address] Phone: [phone]   Fax:  [phone]  Physical Therapy Treatment  Patient Details  Name: Sonya Kline MRN: [id number] Date of Birth: 1970-08-19 Referring Provider (PT): Asencion Noble MD    Encounter Date: 09/08/2019   PT End of Session - 09/08/19 1128    Visit Number 13    Number of Visits 18    Date for PT Re-Evaluation 09/26/19    Authorization Type [employer] COMMM PPO    Authorization - Visit Number 13    Authorization - Number of Visits 60    Progress Note Due on Visit 20    PT Start Time 1050    PT Stop Time 1128    PT Time Calculation (min) 38 min    Activity Tolerance Patient tolerated treatment well    Behavior During Therapy [facility] for tasks assessed/performed           Past Medical History:  Diagnosis Date  . Diverticulosis   . HTN (hypertension)     Past Surgical History:  Procedure Laterality Date  . ABDOMINAL HYSTERECTOMY    . CESAREAN SECTION     x 2  . COLONOSCOPY N/A 08/22/2019   Procedure: COLONOSCOPY;  Surgeon: Daneil Dolin, MD;  [facility];  Service: Endoscopy;  Laterality: N/A;  9:30  . POLYPECTOMY  08/22/2019   Procedure: POLYPECTOMY;  Surgeon: Daneil Dolin, MD;  [facility];  Service: Endoscopy;;    There were no vitals filed for this visit.   Subjective Assessment - 09/08/19 1053    Subjective pt states her pain remains the same with no improvements.  6/10.    Currently in Pain? Yes    Pain Score 6     Pain Location Back    Pain Orientation Lower    Pain Descriptors / Indicators Aching;Tightness                             [facility] Adult PT Treatment/Exercise - 09/08/19 0001      Lumbar Exercises: Machines for Strengthening   Other Lumbar Machine Exercise machine walkout 40# x 10       Lumbar Exercises: Standing   Other Standing Lumbar Exercises palloff press GTB x 15 each in semi tandem stance      Other Standing Lumbar Exercises sidestepping 2RT       Lumbar Exercises: Seated   Sit to Stand 20 reps      Lumbar Exercises: Supine   Bent Knee Raise 20 reps    Dead Bug 20 reps    Bridge 20 reps    Straight Leg Raise 15 reps    Straight Leg Raises Limitations with ab set       Lumbar Exercises: Sidelying   Hip Abduction 15 reps      Lumbar Exercises: Prone   Straight Leg Raise 15 reps    Other Prone Lumbar Exercises heelsqueezes 10X5"       Manual Therapy   Manual Therapy Soft tissue mobilization    Manual therapy comments Manual complete separate than rest of tx    Soft tissue mobilization bilateral lumbar paraspinals with patient in prone                     PT Short Term Goals - 08/28/19 1008      PT SHORT TERM GOAL #1  Title Patient will be independent with initial HEP and self-management strategies to improve functional outcomes    Time 3    Period Weeks    Status Achieved    Target Date 08/22/19      PT SHORT TERM GOAL #2   Title Patient will report at least 30% overall improvement in subjective complaint to indicate improvement in ability to perform ADLs.    Time 3    Period Weeks    Status Achieved    Target Date 08/22/19             PT Long Term Goals - 08/28/19 1009      PT LONG TERM GOAL #1   Title Patient will report at least 70% overall improvement in subjective complaint to indicate improvement in ability to perform ADLs.    Time 6    Period Weeks    Status On-going      PT LONG TERM GOAL #2   Title Patient will have equal to or > 4+/5 MMT throughout BLEs to improve ability to perform functional mobility, stair ambulation and ADLs.    Time 6    Period Weeks    Status Partially Met      PT LONG TERM GOAL #3   Title Patient will improve lumbar AROM by 25% in restricted plans to improve functional mobility and ability to perform ADLs.    Time 6    Period Weeks    Status Partially Met      PT LONG TERM GOAL #4   Title Patient  will have 3 day average pain not exceeding 3/10 globally with normal ADLs to indicate improved functional ability and quality of life.    Time 6    Period Weeks    Status On-going                 Plan - 09/08/19 1129    Clinical Impression Statement Increased all exercises to 20 reps today in supine and added prone heel squeeze and extensions to work on glute strength.  Pt able to complete all exercises today without pain reported or painful behaviors noted.  Contiued with manual at end of session with no spasms or tightness palpated along lumbar paraspinals.  Pt also with no pain behviors with manual to this area.    Examination-Activity Limitations Lift;Stand;Locomotion Level;Bend;Squat;Carry;Sit    Examination-Participation Restrictions Yard Work;Community Activity;Laundry;Cleaning    Stability/Clinical Decision Making Stable/Uncomplicated    Rehab Potential Good    PT Frequency 2x / week    PT Duration 4 weeks    PT Treatment/Interventions ADLs/Self Care Home Management;Aquatic Therapy;Biofeedback;Cryotherapy;Electrical Stimulation;Contrast Bath;Fluidtherapy;Therapeutic activities;Parrafin;Therapeutic exercise;Orthotic Fit/Training;Patient/family education;Cognitive remediation;Functional mobility training;Ultrasound;Traction;Moist Heat;Stair training;Iontophoresis '4mg'$ /ml Dexamethasone;Gait training;DME Instruction;Balance training;Scar mobilization;Passive range of motion;Neuromuscular re-education;Dry needling;Spinal Manipulations;Energy conservation;Manual techniques;Manual lymph drainage;Splinting;Compression bandaging;Taping;Vasopneumatic Device;Joint Manipulations    PT Next Visit Plan Progress functional strengthening with proper lifting, postural strengthening and mobility for pain control. Add band to sidestepping    PT Home Exercise Plan 6/17:  hip excursions, cervical excursions; 6/22: ab set, chin tuck, scap retraction. 08/12/19: cervical SB isometrics 08/26/19: tband rows and  extension    Consulted and Agree with Plan of Care Patient           Patient will benefit from skilled therapeutic intervention in order to improve the following deficits and impairments:  Increased fascial restricitons, Improper body mechanics, Pain, Postural dysfunction, Decreased mobility, Decreased range of motion, Decreased strength, Hypomobility, Impaired flexibility  Visit Diagnosis: Muscle weakness (generalized)  Acute  low back pain, unspecified back pain laterality, unspecified whether sciatica present     Problem List Patient Active Problem List   Diagnosis Date Noted  . Pain of upper abdomen   . Ileus ([place]) 04/23/2018  . Labral tear of hip, degenerative 10/16/2013  . Ankle pain, left 05/03/2011  . DIVERTICULITIS, HX OF 03/09/2009   Teena Irani, PTA/CLT [phone]  Teena Irani 09/08/2019, 11:32 AM  [place], [place], [address] Phone: [phone]   Fax:  [phone]  Name: Sonya Kline MRN: [id number] Date of Birth: [birth year]

## 2019-09-09 DIAGNOSIS — M545 Low back pain: Secondary | ICD-10-CM | POA: Diagnosis not present

## 2019-09-09 DIAGNOSIS — M542 Cervicalgia: Secondary | ICD-10-CM | POA: Diagnosis not present

## 2019-09-10 ENCOUNTER — Ambulatory Visit (HOSPITAL_COMMUNITY): Payer: BC Managed Care – PPO | Admitting: Physical Therapy

## 2019-09-10 ENCOUNTER — Other Ambulatory Visit: Payer: Self-pay

## 2019-09-10 DIAGNOSIS — M542 Cervicalgia: Secondary | ICD-10-CM | POA: Diagnosis not present

## 2019-09-10 DIAGNOSIS — M545 Low back pain, unspecified: Secondary | ICD-10-CM

## 2019-09-10 DIAGNOSIS — M6281 Muscle weakness (generalized): Secondary | ICD-10-CM

## 2019-09-10 NOTE — Therapy (Signed)
[place], [place], [address] Phone: [phone]   Fax:  [phone]  Physical Therapy Treatment  Patient Details  Name: Sonya Kline MRN: [id number] Date of Birth: May 28, 1970 Referring Provider (PT): Asencion Noble MD    Encounter Date: 09/10/2019    Past Medical History:  Diagnosis Date  . Diverticulosis   . HTN (hypertension)     Past Surgical History:  Procedure Laterality Date  . ABDOMINAL HYSTERECTOMY    . CESAREAN SECTION     x 2  . COLONOSCOPY N/A 08/22/2019   Procedure: COLONOSCOPY;  Surgeon: Daneil Dolin, MD;  [facility];  Service: Endoscopy;  Laterality: N/A;  9:30  . POLYPECTOMY  08/22/2019   Procedure: POLYPECTOMY;  Surgeon: Daneil Dolin, MD;  [facility];  Service: Endoscopy;;    There were no vitals filed for this visit.   Subjective Assessment - 09/10/19 1016    Subjective Pt states she felt better after last session.  STates she went to MD yesterday and had lost 10#.  Currently her pain has returned same as last visit.    Currently in Pain? Yes    Pain Score 6     Pain Location Back    Pain Orientation Lower    Pain Descriptors / Indicators Aching;Tightness    Pain Type Acute pain                             [facility] Adult PT Treatment/Exercise - 09/10/19 0001      Lumbar Exercises: Stretches   Active Hamstring Stretch Right;Left;3 reps;20 seconds    Active Hamstring Stretch Limitations supine with UE assist      Lumbar Exercises: Machines for Strengthening   Other Lumbar Machine Exercise vectors with 1 HHA 10X5" each    Other Lumbar Machine Exercise machine walkout 40# x 10       Lumbar Exercises: Standing   Scapular Retraction Theraband;20 reps    Theraband Level (Scapular Retraction) Level 3 (Green)    Row Theraband;20 reps    Theraband Level (Row) Level 3 (Green)    Shoulder Extension Theraband;20 reps    Theraband Level (Shoulder  Extension) Level 3 (Green)    Other Standing Lumbar Exercises palloff press GTB x 15 each in semi tandem stance     Other Standing Lumbar Exercises sidestepping 2RT with RTB at ankles      Lumbar Exercises: Seated   Sit to Stand 20 reps      Lumbar Exercises: Supine   Bent Knee Raise 20 reps    Dead Bug 20 reps    Bridge 20 reps    Straight Leg Raise 15 reps    Straight Leg Raises Limitations with ab set       Lumbar Exercises: Sidelying   Hip Abduction 15 reps      Lumbar Exercises: Prone   Other Prone Lumbar Exercises heelsqueezes 10X5"       Manual Therapy   Manual Therapy Soft tissue mobilization    Manual therapy comments Manual complete separate than rest of tx    Soft tissue mobilization bilateral lumbar paraspinals with patient in prone                     PT Short Term Goals - 08/28/19 1008      PT SHORT TERM GOAL #1   Title Patient will be independent  with initial HEP and self-management strategies to improve functional outcomes    Time 3    Period Weeks    Status Achieved    Target Date 08/22/19      PT SHORT TERM GOAL #2   Title Patient will report at least 30% overall improvement in subjective complaint to indicate improvement in ability to perform ADLs.    Time 3    Period Weeks    Status Achieved    Target Date 08/22/19             PT Long Term Goals - 08/28/19 1009      PT LONG TERM GOAL #1   Title Patient will report at least 70% overall improvement in subjective complaint to indicate improvement in ability to perform ADLs.    Time 6    Period Weeks    Status On-going      PT LONG TERM GOAL #2   Title Patient will have equal to or > 4+/5 MMT throughout BLEs to improve ability to perform functional mobility, stair ambulation and ADLs.    Time 6    Period Weeks    Status Partially Met      PT LONG TERM GOAL #3   Title Patient will improve lumbar AROM by 25% in restricted plans to improve functional mobility and ability to perform  ADLs.    Time 6    Period Weeks    Status Partially Met      PT LONG TERM GOAL #4   Title Patient will have 3 day average pain not exceeding 3/10 globally with normal ADLs to indicate improved functional ability and quality of life.    Time 6    Period Weeks    Status On-going                 Plan - 09/10/19 1108    Clinical Impression Statement Continued with focus on improving LE strength and core stability.   Added theraband to sidestepping with noted challenge.  Vector stance added to improve glute strength.  Hamstring stretch added in supine to improve hamstring length.   Noted Rt hamstring tighter than lt with stretch.  Continued with manual at end of session to tight mm but with minimal tightness palapated.    Examination-Activity Limitations Lift;Stand;Locomotion Level;Bend;Squat;Carry;Sit    Examination-Participation Restrictions Yard Work;Community Activity;Laundry;Cleaning    Stability/Clinical Decision Making Stable/Uncomplicated    Rehab Potential Good    PT Frequency 2x / week    PT Duration 4 weeks    PT Treatment/Interventions ADLs/Self Care Home Management;Aquatic Therapy;Biofeedback;Cryotherapy;Electrical Stimulation;Contrast Bath;Fluidtherapy;Therapeutic activities;Parrafin;Therapeutic exercise;Orthotic Fit/Training;Patient/family education;Cognitive remediation;Functional mobility training;Ultrasound;Traction;Moist Heat;Stair training;Iontophoresis 27m/ml Dexamethasone;Gait training;DME Instruction;Balance training;Scar mobilization;Passive range of motion;Neuromuscular re-education;Dry needling;Spinal Manipulations;Energy conservation;Manual techniques;Manual lymph drainage;Splinting;Compression bandaging;Taping;Vasopneumatic Device;Joint Manipulations    PT Next Visit Plan Progress functional strengthening with proper lifting, postural strengthening and mobility for pain control.    PT Home Exercise Plan 6/17:  hip excursions, cervical excursions; 6/22: ab set,  chin tuck, scap retraction. 08/12/19: cervical SB isometrics 08/26/19: tband rows and extension    Consulted and Agree with Plan of Care Patient           Patient will benefit from skilled therapeutic intervention in order to improve the following deficits and impairments:  Increased fascial restricitons, Improper body mechanics, Pain, Postural dysfunction, Decreased mobility, Decreased range of motion, Decreased strength, Hypomobility, Impaired flexibility  Visit Diagnosis: Muscle weakness (generalized)  Acute low back pain, unspecified back pain laterality, unspecified whether sciatica present  Problem List Patient Active Problem List   Diagnosis Date Noted  . Pain of upper abdomen   . Ileus ([place]) 04/23/2018  . Labral tear of hip, degenerative 10/16/2013  . Ankle pain, left 05/03/2011  . DIVERTICULITIS, HX OF 03/09/2009   Teena Irani, PTA/CLT [phone]  Teena Irani 09/10/2019, 11:09 AM  [place], [place], [address] Phone: [phone]   Fax:  [phone]  Name: Sonya Kline MRN: [id number] Date of Birth: [birth year]

## 2019-09-16 ENCOUNTER — Other Ambulatory Visit: Payer: Self-pay

## 2019-09-16 ENCOUNTER — Ambulatory Visit (HOSPITAL_COMMUNITY): Payer: BC Managed Care – PPO | Attending: Internal Medicine | Admitting: Physical Therapy

## 2019-09-16 DIAGNOSIS — M545 Low back pain, unspecified: Secondary | ICD-10-CM

## 2019-09-16 DIAGNOSIS — M542 Cervicalgia: Secondary | ICD-10-CM | POA: Insufficient documentation

## 2019-09-16 DIAGNOSIS — M6281 Muscle weakness (generalized): Secondary | ICD-10-CM | POA: Insufficient documentation

## 2019-09-16 NOTE — Therapy (Signed)
[place], [place], [address] Phone: [phone]   Fax:  [phone]  Physical Therapy Treatment  Patient Details  Name: Sonya Kline MRN: [id number] Date of Birth: [birth year] Referring Provider (PT): Asencion Noble MD    Encounter Date: 09/16/2019   PT End of Session - 09/16/19 1359    Visit Number 14    Number of Visits 18    Date for PT Re-Evaluation 09/26/19    Authorization Type [employer] COMMM PPO    Authorization - Visit Number 14    Authorization - Number of Visits 60    Progress Note Due on Visit 20    PT Start Time 1320    PT Stop Time 1400    PT Time Calculation (min) 40 min    Activity Tolerance Patient tolerated treatment well    Behavior During Therapy [facility] for tasks assessed/performed           Past Medical History:  Diagnosis Date  . Diverticulosis   . HTN (hypertension)     Past Surgical History:  Procedure Laterality Date  . ABDOMINAL HYSTERECTOMY    . CESAREAN SECTION     x 2  . COLONOSCOPY N/A 08/22/2019   Procedure: COLONOSCOPY;  Surgeon: Daneil Dolin, MD;  [facility];  Service: Endoscopy;  Laterality: N/A;  9:30  . POLYPECTOMY  08/22/2019   Procedure: POLYPECTOMY;  Surgeon: Daneil Dolin, MD;  [facility];  Service: Endoscopy;;    There were no vitals filed for this visit.   Subjective Assessment - 09/16/19 1331    Subjective pt states it continues to do well.  Currently without pain.    Currently in Pain? No/denies                             [facility] Adult PT Treatment/Exercise - 09/16/19 0001      Lumbar Exercises: Stretches   Active Hamstring Stretch Right;Left;3 reps;20 seconds    Active Hamstring Stretch Limitations standing with 12" prop      Lumbar Exercises: Machines for Strengthening   Leg Press 3PL 2X10    Other Lumbar Machine Exercise vectors with 1 HHA 10X5" each    Other Lumbar Machine Exercise machine walkout 40# x 10         Lumbar Exercises: Standing   Functional Squats 15 reps    Forward Lunge 10 reps    Forward Lunge Limitations using 4" no UE assist    Scapular Retraction Theraband;20 reps    Theraband Level (Scapular Retraction) Level 3 (Green)    Row Theraband;20 reps    Theraband Level (Row) Level 3 (Green)    Shoulder Extension Theraband;20 reps    Theraband Level (Shoulder Extension) Level 3 (Green)    Other Standing Lumbar Exercises palloff press GTB x 15 each in semi tandem stance     Other Standing Lumbar Exercises sidestepping 2RT with RTB at ankles      Lumbar Exercises: Seated   Sit to Stand 20 reps   no UE                   PT Short Term Goals - 08/28/19 1008      PT SHORT TERM GOAL #1   Title Patient will be independent with initial HEP and self-management strategies to improve functional outcomes    Time 3    Period Weeks  Status Achieved    Target Date 08/22/19      PT SHORT TERM GOAL #2   Title Patient will report at least 30% overall improvement in subjective complaint to indicate improvement in ability to perform ADLs.    Time 3    Period Weeks    Status Achieved    Target Date 08/22/19             PT Long Term Goals - 08/28/19 1009      PT LONG TERM GOAL #1   Title Patient will report at least 70% overall improvement in subjective complaint to indicate improvement in ability to perform ADLs.    Time 6    Period Weeks    Status On-going      PT LONG TERM GOAL #2   Title Patient will have equal to or > 4+/5 MMT throughout BLEs to improve ability to perform functional mobility, stair ambulation and ADLs.    Time 6    Period Weeks    Status Partially Met      PT LONG TERM GOAL #3   Title Patient will improve lumbar AROM by 25% in restricted plans to improve functional mobility and ability to perform ADLs.    Time 6    Period Weeks    Status Partially Met      PT LONG TERM GOAL #4   Title Patient will have 3 day average pain not exceeding  3/10 globally with normal ADLs to indicate improved functional ability and quality of life.    Time 6    Period Weeks    Status On-going                 Plan - 09/16/19 1400    Clinical Impression Statement Increased focus to functional strengthening this session.  Added leg press,squats, lunges and instructed with standing hamsting stretch.  pt able to complete all with minimal cues and no c/o pain.    Examination-Activity Limitations Lift;Stand;Locomotion Level;Bend;Squat;Carry;Sit    Examination-Participation Restrictions Yard Work;Community Activity;Laundry;Cleaning    Stability/Clinical Decision Making Stable/Uncomplicated    Rehab Potential Good    PT Frequency 2x / week    PT Duration 4 weeks    PT Treatment/Interventions ADLs/Self Care Home Management;Aquatic Therapy;Biofeedback;Cryotherapy;Electrical Stimulation;Contrast Bath;Fluidtherapy;Therapeutic activities;Parrafin;Therapeutic exercise;Orthotic Fit/Training;Patient/family education;Cognitive remediation;Functional mobility training;Ultrasound;Traction;Moist Heat;Stair training;Iontophoresis 78m/ml Dexamethasone;Gait training;DME Instruction;Balance training;Scar mobilization;Passive range of motion;Neuromuscular re-education;Dry needling;Spinal Manipulations;Energy conservation;Manual techniques;Manual lymph drainage;Splinting;Compression bandaging;Taping;Vasopneumatic Device;Joint Manipulations    PT Next Visit Plan Progress functional strengthening with proper lifting, postural strengthening and mobility for pain control.    PT Home Exercise Plan 6/17:  hip excursions, cervical excursions; 6/22: ab set, chin tuck, scap retraction. 08/12/19: cervical SB isometrics 08/26/19: tband rows and extension    Consulted and Agree with Plan of Care Patient           Patient will benefit from skilled therapeutic intervention in order to improve the following deficits and impairments:  Increased fascial restricitons, Improper body  mechanics, Pain, Postural dysfunction, Decreased mobility, Decreased range of motion, Decreased strength, Hypomobility, Impaired flexibility  Visit Diagnosis: Cervicalgia  Acute low back pain, unspecified back pain laterality, unspecified whether sciatica present  Muscle weakness (generalized)     Problem List Patient Active Problem List   Diagnosis Date Noted  . Pain of upper abdomen   . [place] 04/23/2018  . Labral tear of hip, degenerative 10/16/2013  . Ankle pain, left 05/03/2011  . DIVERTICULITIS, HX OF 03/09/2009   ATeena Irani PTA/CLT  [phone]  Teena Irani 09/16/2019, 2:03 PM  [place] [address] [place], [place], [address] Phone: [phone]   Fax:  [phone]  Name: Sonya Kline MRN: [id number] Date of Birth: 1970/03/28

## 2019-09-18 ENCOUNTER — Telehealth (HOSPITAL_COMMUNITY): Payer: Self-pay | Admitting: Physical Therapy

## 2019-09-18 ENCOUNTER — Ambulatory Visit (HOSPITAL_COMMUNITY): Payer: BC Managed Care – PPO | Admitting: Physical Therapy

## 2019-09-18 NOTE — Telephone Encounter (Signed)
pt called to cx this appt due to she is running too late

## 2019-09-19 ENCOUNTER — Other Ambulatory Visit: Payer: Self-pay

## 2019-09-19 ENCOUNTER — Encounter: Payer: Self-pay | Admitting: Emergency Medicine

## 2019-09-19 ENCOUNTER — Ambulatory Visit
Admission: EM | Admit: 2019-09-19 | Discharge: 2019-09-19 | Disposition: A | Discharge status: Home / Self Care | Payer: BC Managed Care – PPO | Attending: Emergency Medicine | Admitting: Emergency Medicine

## 2019-09-19 DIAGNOSIS — N898 Other specified noninflammatory disorders of vagina: Secondary | ICD-10-CM

## 2019-09-19 DIAGNOSIS — B379 Candidiasis, unspecified: Secondary | ICD-10-CM

## 2019-09-19 MED ORDER — FLUCONAZOLE 200 MG PO TABS
ORAL_TABLET | ORAL | 0 refills | Status: DC
Start: 2019-09-19 — End: 2019-10-22

## 2019-09-19 NOTE — Discharge Instructions (Signed)
Prescribed diflucan 200 mg once daily and then second dose 72 hours later Take medication as prescribed and to completion Follow up with PCP if symptoms persists Return here or go to ER if you have any new or worsening symptoms fever, chills, nausea, vomiting, abdominal or pelvic pain, painful intercourse, vaginal discharge, vaginal bleeding, persistent symptoms despite treatment, etc..[name]

## 2019-09-19 NOTE — ED Triage Notes (Signed)
provider triage

## 2019-09-19 NOTE — ED Provider Notes (Signed)
[place]   [id number] 09/19/19 Arrival Time: 0821   [id number] itching  SUBJECTIVE:  Sonya Kline is a 49 y.o. female who presents with complaint of abrupt vaginal itching x 1 day.  She denies a precipitating event, recent sexual encounter or recent antibiotic use.  Denies vaginal discharge.  She has tried OTC medications without relief.  She reports worsening symptoms with itching.  Recently treated for yeast infection.  She denies fever, chills, nausea, vomiting, abdominal or pelvic pain, urinary symptoms, vaginal odor, vaginal bleeding, dyspareunia, vaginal rashes or lesions.   Patient's last menstrual period was 11/14/2011.  ROS: As per HPI.  All other pertinent ROS negative.     Past Medical History:  Diagnosis Date  . Diverticulosis   . HTN (hypertension)    Past Surgical History:  Procedure Laterality Date  . ABDOMINAL HYSTERECTOMY    . CESAREAN SECTION     x 2  . COLONOSCOPY N/A 08/22/2019   Procedure: COLONOSCOPY;  Surgeon: Daneil Dolin, MD;  [facility];  Service: Endoscopy;  Laterality: N/A;  9:30  . POLYPECTOMY  08/22/2019   Procedure: POLYPECTOMY;  Surgeon: Daneil Dolin, MD;  [facility];  Service: Endoscopy;;   Allergies  Allergen Reactions  . Contrast Media [Iodinated Diagnostic Agents] Hives  . Prednisone Rash   No current facility-administered medications on file prior to encounter.   Current Outpatient Medications on File Prior to Encounter  Medication Sig Dispense Refill  . amLODipine (NORVASC) 10 MG tablet Take 10 mg by mouth daily.       Social History   Socioeconomic History  . Marital status: Married    Spouse name: Not on file  . Number of children: Not on file  . Years of education: college  . Highest education level: Not on file  Occupational History  . Not on file  Tobacco Use  . Smoking status: Never Smoker  . Smokeless tobacco: Never Used  Vaping Use  . Vaping Use: Never used  Substance and  Sexual Activity  . Alcohol use: No  . Drug use: No  . Sexual activity: Yes    Birth control/protection: None, Surgical  Other Topics Concern  . Not on file  Social History Narrative  . Not on file   Social Determinants of Health   Financial Resource Strain:   . Difficulty of Paying Living Expenses:   Food Insecurity:   . Worried About Charity fundraiser in the Last Year:   . Arboriculturist in the Last Year:   Transportation Needs:   . Film/video editor (Medical):   [name] Lack of Transportation (Non-Medical):   Physical Activity:   . Days of Exercise per Week:   . Minutes of Exercise per Session:   Stress:   . Feeling of Stress :   Social Connections:   . Frequency of Communication with Friends and Family:   . Frequency of Social Gatherings with Friends and Family:   . Attends Religious Services:   . Active Member of Clubs or Organizations:   . Attends Archivist Meetings:   [name] Marital Status:   Intimate Partner Violence:   . Fear of Current or Ex-Partner:   . Emotionally Abused:   [name] Physically Abused:   . Sexually Abused:    Family History  Family history unknown: Yes    OBJECTIVE:  Vitals:   09/19/19 0830  BP: 129/85  Pulse: 86  Resp: 17  Temp: 98.2  F (36.8 C)  TempSrc: Oral  SpO2: 95%     General appearance: Alert, NAD, appears stated age Head: NCAT Throat: lips, mucosa, and tongue normal; teeth and gums normal Lungs: CTA bilaterally without adventitious breath sounds Heart: regular rate and rhythm.   Back: no CVA tenderness Abdomen: soft, non-tender; bowel sounds normal; no guarding GU: deferred Skin: warm and dry Psychological:  Alert and cooperative. Normal mood and affect.  ASSESSMENT & PLAN:  1. Vaginal itching   2. Yeast infection     Meds ordered this encounter  Medications  . fluconazole (DIFLUCAN) 200 MG tablet    Sig: Take one dose by mouth, wait 72 hours, and then take second dose by mouth    Dispense:  2 tablet     Refill:  0    Order Specific Question:   Supervising Provider    Answer:   Raylene Everts [[id number]]    Pending: Labs Reviewed - No data to display  Prescribed diflucan 200 mg once daily and then second dose 72 hours later Take medication as prescribed and to completion Follow up with PCP if symptoms persists Return here or go to ER if you have any new or worsening symptoms fever, chills, nausea, vomiting, abdominal or pelvic pain, painful intercourse, vaginal discharge, vaginal bleeding, persistent symptoms despite treatment, etc...  Reviewed expectations re: course of current medical issues. Questions answered. Outlined signs and symptoms indicating need for more acute intervention. Patient verbalized understanding. After Visit Summary given.       Stacey Drain [place], PA-C 09/19/19 [phone]

## 2019-09-20 NOTE — Progress Notes (Deleted)
Referring Provider: Asencion Noble, MD Primary Care Physician:  Asencion Noble, MD Primary GI Physician: Dr. Abbey Chatters  No chief complaint on file.   HPI:   Sonya Kline is a 49 y.o. female presenting today for ***.   Colonoscopy 08/22/2019 with one 4 mm polyp at the hepatic flexure, pancolonic diverticulosis, otherwise normal exam.  Pathology revealed colonic mucosa with underlying lymphoid aggregates, negative for dysplasia.  Recommended repeat colonoscopy in 10 years.  Today:   Past Medical History:  Diagnosis Date  . Diverticulosis   . HTN (hypertension)     Past Surgical History:  Procedure Laterality Date  . ABDOMINAL HYSTERECTOMY    . CESAREAN SECTION     x 2  . COLONOSCOPY N/A 08/22/2019   Procedure: COLONOSCOPY;  Surgeon: Daneil Dolin, MD;  [facility];  Service: Endoscopy;  Laterality: N/A;  9:30  . POLYPECTOMY  08/22/2019   Procedure: POLYPECTOMY;  Surgeon: Daneil Dolin, MD;  [facility];  Service: Endoscopy;;    Current Outpatient Medications  Medication Sig Dispense Refill  . amLODipine (NORVASC) 10 MG tablet Take 10 mg by mouth daily.     . fluconazole (DIFLUCAN) 200 MG tablet Take one dose by mouth, wait 72 hours, and then take second dose by mouth 2 tablet 0   No current facility-administered medications for this visit.    Allergies as of 09/22/2019 - Review Complete 09/19/2019  Allergen Reaction Noted  . Contrast media [iodinated diagnostic agents] Hives 11/21/2011  . Prednisone Rash 12/20/2013    Family History  Family history unknown: Yes    Social History   Socioeconomic History  . Marital status: Married    Spouse name: Not on file  . Number of children: Not on file  . Years of education: college  . Highest education level: Not on file  Occupational History  . Not on file  Tobacco Use  . Smoking status: Never Smoker  . Smokeless tobacco: Never Used  Vaping Use  . Vaping Use: Never used  Substance and Sexual  Activity  . Alcohol use: No  . Drug use: No  . Sexual activity: Yes    Birth control/protection: None, Surgical  Other Topics Concern  . Not on file  Social History Narrative  . Not on file   Social Determinants of Health   Financial Resource Strain:   . Difficulty of Paying Living Expenses:   Food Insecurity:   . Worried About Charity fundraiser in the Last Year:   . Arboriculturist in the Last Year:   Transportation Needs:   . Film/video editor (Medical):   [name] Lack of Transportation (Non-Medical):   Physical Activity:   . Days of Exercise per Week:   . Minutes of Exercise per Session:   Stress:   . Feeling of Stress :   Social Connections:   . Frequency of Communication with Friends and Family:   . Frequency of Social Gatherings with Friends and Family:   . Attends Religious Services:   . Active Member of Clubs or Organizations:   . Attends Archivist Meetings:   [name] Marital Status:     Review of Systems: Gen: Denies fever, chills, anorexia. Denies fatigue, weakness, weight loss.  CV: Denies chest pain, palpitations, syncope, peripheral edema, and claudication. Resp: Denies dyspnea at rest, cough, wheezing, coughing up blood, and pleurisy. GI: Denies vomiting blood, jaundice, and fecal incontinence.   Denies dysphagia or odynophagia. Derm:  Denies rash, itching, dry skin Psych: Denies depression, anxiety, memory loss, confusion. No homicidal or suicidal ideation.  Heme: Denies bruising, bleeding, and enlarged lymph nodes.  Physical Exam: LMP 11/14/2011  General:   Alert and oriented. No distress noted. Pleasant and cooperative.  Head:  Normocephalic and atraumatic. Eyes:  Conjuctiva clear without scleral icterus. Mouth:  Oral mucosa pink and moist. Good dentition. No lesions. Heart:  S1, S2 present without murmurs appreciated. Lungs:  Clear to auscultation bilaterally. No wheezes, rales, or rhonchi. No distress.  Abdomen:  +BS, soft, non-tender and  non-distended. No rebound or guarding. No HSM or masses noted. Msk:  Symmetrical without gross deformities. Normal posture. Extremities:  Without edema. Neurologic:  Alert and  oriented x4 Psych:  Alert and cooperative. Normal mood and affect.

## 2019-09-22 ENCOUNTER — Ambulatory Visit: Payer: BC Managed Care – PPO | Admitting: Gastroenterology

## 2019-09-23 ENCOUNTER — Other Ambulatory Visit: Payer: Self-pay

## 2019-09-23 ENCOUNTER — Encounter (HOSPITAL_COMMUNITY): Payer: Self-pay | Admitting: Physical Therapy

## 2019-09-23 ENCOUNTER — Ambulatory Visit (HOSPITAL_COMMUNITY): Payer: BC Managed Care – PPO | Admitting: Physical Therapy

## 2019-09-23 DIAGNOSIS — M542 Cervicalgia: Secondary | ICD-10-CM | POA: Diagnosis not present

## 2019-09-23 DIAGNOSIS — M545 Low back pain, unspecified: Secondary | ICD-10-CM

## 2019-09-23 DIAGNOSIS — M6281 Muscle weakness (generalized): Secondary | ICD-10-CM | POA: Diagnosis not present
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
## 2019-09-25 ENCOUNTER — Ambulatory Visit (HOSPITAL_COMMUNITY): Payer: BC Managed Care – PPO | Admitting: Physical Therapy

## 2019-10-03 ENCOUNTER — Encounter (HOSPITAL_COMMUNITY): Payer: BC Managed Care – PPO | Admitting: Physical Therapy

## 2019-10-03 DIAGNOSIS — M5416 Radiculopathy, lumbar region: Secondary | ICD-10-CM | POA: Diagnosis not present

## 2019-10-07 DIAGNOSIS — N898 Other specified noninflammatory disorders of vagina: Secondary | ICD-10-CM | POA: Diagnosis not present

## 2019-10-07 DIAGNOSIS — Z6836 Body mass index (BMI) 36.0-36.9, adult: Secondary | ICD-10-CM | POA: Diagnosis not present

## 2019-10-08 LAB — HEMOGLOBIN A1C: Hemoglobin A1C: >15.5

## 2019-10-13 DIAGNOSIS — R7309 Other abnormal glucose: Secondary | ICD-10-CM | POA: Diagnosis not present

## 2019-10-13 DIAGNOSIS — E119 Type 2 diabetes mellitus without complications: Secondary | ICD-10-CM | POA: Diagnosis not present

## 2019-10-22 ENCOUNTER — Ambulatory Visit: Payer: BC Managed Care – PPO | Admitting: "Endocrinology

## 2019-10-22 ENCOUNTER — Encounter: Payer: Self-pay | Admitting: "Endocrinology

## 2019-10-22 ENCOUNTER — Other Ambulatory Visit: Payer: Self-pay

## 2019-10-22 VITALS — BP 100/72 | HR 84 | Ht 62.0 in | Wt 194.6 lb

## 2019-10-22 DIAGNOSIS — E1165 Type 2 diabetes mellitus with hyperglycemia: Secondary | ICD-10-CM

## 2019-10-22 NOTE — Progress Notes (Signed)
Pt states she is unable to take metformin due to causing "severe" headaches, vomiting and diarrhea.

## 2019-10-22 NOTE — Progress Notes (Signed)
Endocrinology Consult Note       10/22/2019, 9:41 PM   Subjective:    Patient ID: Sonya Kline, female    DOB: 1970-06-23.  Sonya Kline is being seen in consultation for management of currently uncontrolled symptomatic diabetes requested by  Asencion Noble, MD.   Past Medical History:  Diagnosis Date   Diabetes mellitus, type II ([place])    Diverticulosis    HTN (hypertension)     Past Surgical History:  Procedure Laterality Date   ABDOMINAL HYSTERECTOMY     CESAREAN SECTION     x 2   COLONOSCOPY N/A 08/22/2019   Procedure: COLONOSCOPY;  Surgeon: Daneil Dolin, MD;  [facility];  Service: Endoscopy;  Laterality: N/A;  9:30   POLYPECTOMY  08/22/2019   Procedure: POLYPECTOMY;  Surgeon: Daneil Dolin, MD;  [facility];  Service: Endoscopy;;    Social History   Socioeconomic History   Marital status: Married    Spouse name: Not on file   Number of children: Not on file   Years of education: college   Highest education level: Not on file  Occupational History   Not on file  Tobacco Use   Smoking status: Never Smoker   Smokeless tobacco: Never Used  Vaping Use   Vaping Use: Never used  Substance and Sexual Activity   Alcohol use: No   Drug use: No   Sexual activity: Yes    Birth control/protection: None, Surgical  Other Topics Concern   Not on file  Social History Narrative   Not on file   Social Determinants of Health   Financial Resource Strain:    Difficulty of Paying Living Expenses: Not on file  Food Insecurity:    Worried About [place] in the Last Year: Not on file   Ran Out of Food in the Last Year: Not on file  Transportation Needs:    Lack of Transportation (Medical): Not on file   Lack of Transportation (Non-Medical): Not on file  Physical Activity:    Days of Exercise per Week: Not on file   Minutes of Exercise  per Session: Not on file  Stress:    Feeling of Stress : Not on file  Social Connections:    Frequency of Communication with Friends and Family: Not on file   Frequency of Social Gatherings with Friends and Family: Not on file   Attends Religious Services: Not on file   Active Member of Clubs or Organizations: Not on file   Attends Archivist Meetings: Not on file   Marital Status: Not on file    Family History  Problem Relation Age of Onset   Diabetes Mother    Hypertension Mother    Hypertension Father     Outpatient Encounter Medications as of 10/22/2019  Medication Sig   insulin glargine (LANTUS) 100 UNIT/ML Solostar Pen Inject 10 Units into the skin at bedtime.   amLODipine (NORVASC) 10 MG tablet Take 10 mg by mouth daily.    [DISCONTINUED] fluconazole (DIFLUCAN) 200 MG tablet Take one dose by mouth, wait 72 hours, and  then take second dose by mouth   No facility-administered encounter medications on file as of 10/22/2019.    ALLERGIES: Allergies  Allergen Reactions   Contrast Media [Iodinated Diagnostic Agents] Hives   Prednisone Rash    VACCINATION STATUS: Immunization History  Administered Date(s) Administered   Rabies, IM 03/17/2013, 03/20/2013, 03/27/2013, 04/11/2013   Tdap 03/17/2013    Diabetes She presents for her initial diabetic visit. She has type 2 diabetes mellitus. Onset time: She was diagnosed at approximate age of 2 years. Her disease course has been worsening. There are no hypoglycemic associated symptoms. There are no hypoglycemic complications. Symptoms are worsening. There are no diabetic complications. Risk factors for coronary artery disease include diabetes mellitus, hypertension and obesity. Current diabetic treatment includes insulin injections (She was on Lantus 10 units nightly. she reports intolerance to MTF.[name]). Her weight is decreasing steadily. She is following a generally unhealthy diet. When asked about meal  planning, she reported none. She has not had a previous visit with a dietitian. She rarely participates in exercise. Her home blood glucose trend is decreasing steadily. Her breakfast blood glucose range is generally >200 mg/dl. Her bedtime blood glucose range is generally >200 mg/dl. (AM: 353, 344, 351,327,270,315,233,289, (82) PM: 248, [phone], 357  She insists she saw a BG of 82 today AM. She did not bring any meter.) An ACE inhibitor/angiotensin II receptor blocker is not being taken.  Hypertension This is a chronic problem. The current episode started more than 1 year ago. The problem is controlled. Risk factors for coronary artery disease include diabetes mellitus, family history, obesity and sedentary lifestyle. Past treatments include calcium channel blockers.     Review of Systems  Objective:    Vitals with BMI 10/22/2019 09/19/2019 08/22/2019  Height 5\' 2"  - -  Weight 194 lbs 10 oz - -  BMI 26.37 - -  Systolic 858 850 277  Diastolic 72 85 77  Pulse 84 86 90    BP 100/72    Pulse 84    Ht 5\' 2"  (1.575 m)    Wt 194 lb 9.6 oz (88.3 kg)    LMP 11/14/2011    BMI 35.59 kg/m   Wt Readings from Last 3 Encounters:  10/22/19 194 lb 9.6 oz (88.3 kg)  07/12/19 228 lb (103.4 kg)  04/23/18 232 lb 6.4 oz (105.4 kg)     Physical Exam    CMP ( most recent) CMP     Component Value Date/Time   NA 140 04/25/2018 0433   K 3.5 04/25/2018 0433   CL 105 04/25/2018 0433   CO2 29 04/25/2018 0433   GLUCOSE 110 (H) 04/25/2018 0433   BUN 10 04/25/2018 0433   CREATININE 0.63 04/25/2018 0433   CALCIUM 8.5 (L) 04/25/2018 0433   PROT 6.8 04/25/2018 0433   ALBUMIN 3.1 (L) 04/25/2018 0433   AST 19 04/25/2018 0433   ALT 17 04/25/2018 0433   ALKPHOS 70 04/25/2018 0433   BILITOT 0.3 04/25/2018 0433   GFRNONAA >60 04/25/2018 0433   GFRAA >60 04/25/2018 0433     Diabetic Labs (most recent): Lab Results  Component Value Date   HGBA1C >15.5 10/08/2019        Assessment & Plan:    1. Uncontrolled type 2 diabetes mellitus with hyperglycemia ([place])  - Sonya Kline has currently uncontrolled symptomatic type 2 DM since  49 years of age,  with most recent A1c of >15.5%. Recent labs reviewed. She brought a log showing the following blood  glucose readings. AM: 353, 344, 351,327,270,315,233,289, (82) PM: 248, [phone], 357  She insists she saw a BG of 82 today AM. She did not bring any meter. - I had a long discussion with her about the progressive nature of diabetes and the pathology behind its complications. -She does not report gross complications of diabetes, however she remains at a high risk for more acute and chronic complications which include CAD, CVA, CKD, retinopathy, and neuropathy. These are all discussed in detail with her.  - I have counseled her on diet  and weight management  by adopting a carbohydrate restricted/protein rich diet. Patient is encouraged to switch to  unprocessed or minimally processed     complex starch and increased protein intake (animal or plant source), fruits, and vegetables. -  she is advised to stick to a routine mealtimes to eat 3 meals  a day and avoid unnecessary snacks ( to snack only to correct hypoglycemia).   - she admits that there is a room for improvement in her food and drink choices. - Suggestion is made for her to avoid simple carbohydrates  from her diet including Cakes, Sweet Desserts, Ice Cream, Soda (diet and regular), Sweet Tea, Candies, Chips, Cookies, Store Bought Juices, Alcohol in Excess of  1-2 drinks a day, Artificial Sweeteners,  Coffee Creamer, and "Sugar-free" Products. This will help patient to have more stable blood glucose profile and potentially avoid unintended weight gain.  - she will be scheduled with Jearld Fenton, RDN, CDE for diabetes education.  - I have approached her with the following individualized plan to manage  her diabetes and patient agrees:   -In light of her prevailing glycemic  burden, she will continue to need insulin treatment in order for her to achieve control of diabetes to target. -Curiously, she insists that her blood glucose this morning was 82, the only one under a 100 for the last 7 days . -In preparation, she is advised to initiate strict monitoring of blood glucose 4 times a day-daily before meals and at bedtime and return in 1 week with her meter and logs for evaluation.   -In the meantime, she is advised to continue Lantus 10 units nightly.   -She reports intolerance to Metformin.  She will be considered for glipizide or bolus insulin next visit.  - she is encouraged to call clinic for blood glucose levels less than 70 or above 300 mg /dl.   - she will be considered for incretin therapy as appropriate next visit.  - Specific targets for  A1c;  LDL, HDL,  and Triglycerides were discussed with the patient.  2) Blood Pressure /Hypertension:  her blood pressure is  controlled to target.   she is advised to continue her current medications including amlodipine 10 mg p.o. daily with breakfast . 3) Lipids/Hyperlipidemia: She does not have lipid panel to review.  She is not on antilipid management at this time.  She will be considered for fasting lipid panel on next visit.  4)  Weight/Diet:  Body mass index is 35.59 kg/m.  -   clearly complicating her diabetes care.   she is  a candidate for weight loss. I discussed with her the fact that loss of 5 - 10% of her  current body weight will have the most impact on her diabetes management.  Exercise, and detailed carbohydrates information provided  -  detailed on discharge instructions.  5) Chronic Care/Health Maintenance:  -she  Is not on ACEI/ARB and Statin medications and  is encouraged to initiate and continue to follow up with Ophthalmology, Dentist,  Podiatrist at least yearly or according to recommendations, and advised to   stay away from smoking. I have recommended yearly flu vaccine and pneumonia vaccine at  least every 5 years; moderate intensity exercise for up to 150 minutes weekly; and  sleep for at least 7 hours a day.  - she is  advised to maintain close follow up with Asencion Noble, MD for primary care needs, as well as her other providers for optimal and coordinated care.   - Time spent in this patient care: 60 min, of which > 50% was spent in  counseling  her about her her currently uncontrolled type 2 diabetes, hypertension, obesity/sedentary life and the rest reviewing her blood glucose logs , discussing her hypoglycemia and hyperglycemia episodes, reviewing her current and  previous labs / studies  ( including abstraction from other facilities) and medications  doses and developing a  long term treatment plan based on the latest standards of care/ guidelines; and documenting her care.    Please refer to Patient Instructions for Blood Glucose Monitoring and Insulin/Medications Dosing Guide"  in media tab for additional information. Please  also refer to " Patient Self Inventory" in the Media  tab for reviewed elements of pertinent patient history.  [name] participated in the discussions, expressed understanding, and voiced agreement with the above plans.  All questions were answered to her satisfaction. she is encouraged to contact clinic should she have any questions or concerns prior to her return visit.   Follow up plan: - Return in about 1 week (around 10/29/2019) for F/U with Meter and Logs Only - no Labs.  Glade Lloyd, MD [facility] [address] [place], [place] [address] Phone: [phone]  Fax: [phone]    10/22/2019, 9:41 PM  This note was partially dictated with voice recognition software. Similar sounding words can be transcribed inadequately or may not  be corrected upon review.

## 2019-10-22 NOTE — Patient Instructions (Signed)

## 2019-10-24 ENCOUNTER — Telehealth: Payer: Self-pay | Admitting: "Endocrinology

## 2019-10-24 DIAGNOSIS — M545 Low back pain: Secondary | ICD-10-CM | POA: Diagnosis not present

## 2019-10-24 DIAGNOSIS — E119 Type 2 diabetes mellitus without complications: Secondary | ICD-10-CM | POA: Diagnosis not present

## 2019-10-24 NOTE — Telephone Encounter (Signed)
Left a message requesting a return call to the office. 

## 2019-10-24 NOTE — Telephone Encounter (Signed)
Patient is calling about high readings requesting call back.  9/9 - 361 AM, 321 evening, 359 supper  9/10 - 308 AM

## 2019-10-24 NOTE — Telephone Encounter (Signed)
Pt came by the office, she stated she takes lantus 10 units daily at bedtime, no oral diabetic medication. Discussed pt's readings and medication with Dr.Nida. Advised pt to increase lantus to 30 units daily at bedtime per Dr.Nida's orders. Understanding voiced per pt.

## 2019-10-27 ENCOUNTER — Other Ambulatory Visit: Payer: Self-pay

## 2019-10-27 ENCOUNTER — Encounter: Payer: No Typology Code available for payment source | Attending: "Endocrinology | Admitting: Dietician

## 2019-10-27 ENCOUNTER — Encounter: Payer: Self-pay | Admitting: Nutrition

## 2019-10-27 VITALS — Ht 62.0 in | Wt 196.2 lb

## 2019-10-27 DIAGNOSIS — E119 Type 2 diabetes mellitus without complications: Secondary | ICD-10-CM

## 2019-10-27 NOTE — Patient Instructions (Addendum)
Try unsweetened tea or green tea with Stevia.  Walk with your husband.  Eat three meals a day, about 5 hours apart.
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
## 2019-10-29 ENCOUNTER — Other Ambulatory Visit: Payer: Self-pay

## 2019-10-29 ENCOUNTER — Encounter: Payer: Self-pay | Admitting: "Endocrinology

## 2019-10-29 ENCOUNTER — Ambulatory Visit (INDEPENDENT_AMBULATORY_CARE_PROVIDER_SITE_OTHER): Payer: BC Managed Care – PPO | Admitting: "Endocrinology

## 2019-10-29 VITALS — BP 100/76 | HR 92 | Ht 62.0 in | Wt 197.2 lb

## 2019-10-29 DIAGNOSIS — E1165 Type 2 diabetes mellitus with hyperglycemia: Secondary | ICD-10-CM

## 2019-10-29 DIAGNOSIS — I10 Essential (primary) hypertension: Secondary | ICD-10-CM | POA: Diagnosis not present

## 2019-10-29 MED ORDER — GLIPIZIDE ER 5 MG PO TB24: 5.0000 mg | ORAL_TABLET | Freq: Every day | ORAL | 1 refills | Status: DC

## 2019-10-29 NOTE — Patient Instructions (Signed)

## 2019-10-29 NOTE — Progress Notes (Signed)
10/29/2019, 4:10 PM  Endocrinology follow-up note   Subjective:    Patient ID: Sonya Kline, female    DOB: 23-Oct-1970.  Sonya Kline is being seen in follow-up after she was seen in consultation for management of currently uncontrolled symptomatic diabetes requested by  Asencion Noble, MD.   Past Medical History:  Diagnosis Date  . Diabetes mellitus, type II ([place])   . Diverticulosis   . HTN (hypertension)     Past Surgical History:  Procedure Laterality Date  . ABDOMINAL HYSTERECTOMY    . CESAREAN SECTION     x 2  . COLONOSCOPY N/A 08/22/2019   Procedure: COLONOSCOPY;  Surgeon: Daneil Dolin, MD;  Location: [facility];  Service: Endoscopy;  Laterality: N/A;  9:30  . POLYPECTOMY  08/22/2019   Procedure: POLYPECTOMY;  Surgeon: Daneil Dolin, MD;  Location: [facility];  Service: Endoscopy;;    Social History   Socioeconomic History  . Marital status: Married    Spouse name: Not on file  . Number of children: Not on file  . Years of education: college  . Highest education level: Not on file  Occupational History  . Not on file  Tobacco Use  . Smoking status: Never Smoker  . Smokeless tobacco: Never Used  Vaping Use  . Vaping Use: Never used  Substance and Sexual Activity  . Alcohol use: No  . Drug use: No  . Sexual activity: Yes    Birth control/protection: None, Surgical  Other Topics Concern  . Not on file  Social History Narrative  . Not on file   Social Determinants of Health   Financial Resource Strain:   . Difficulty of Paying Living Expenses: Not on file  Food Insecurity:   . Worried About Charity fundraiser in the Last Year: Not on file  . Ran Out of Food in the Last Year: Not on file  Transportation Needs:   . Lack of Transportation (Medical): Not on file  . Lack of Transportation (Non-Medical): Not on file  Physical Activity:   . Days of Exercise per Week: Not  on file  . Minutes of Exercise per Session: Not on file  Stress:   . Feeling of Stress : Not on file  Social Connections:   . Frequency of Communication with Friends and Family: Not on file  . Frequency of Social Gatherings with Friends and Family: Not on file  . Attends Religious Services: Not on file  . Active Member of Clubs or Organizations: Not on file  . Attends Archivist Meetings: Not on file  . Marital Status: Not on file    Family History  Problem Relation Age of Onset  . Diabetes Mother   . Hypertension Mother   . Hypertension Father     Outpatient Encounter Medications as of 10/29/2019  Medication Sig  . amLODipine (NORVASC) 10 MG tablet Take 10 mg by mouth daily.   [name] glipiZIDE (GLUCOTROL XL) 5 MG 24 hr tablet Take 1 tablet (5 mg total) by mouth daily with breakfast.  . insulin glargine (LANTUS) 100 UNIT/ML Solostar Pen Inject 40 Units into  the skin at bedtime.   No facility-administered encounter medications on file as of 10/29/2019.    ALLERGIES: Allergies  Allergen Reactions  . Contrast Media [Iodinated Diagnostic Agents] Hives  . Prednisone Rash    VACCINATION STATUS: Immunization History  Administered Date(s) Administered  . Rabies, IM 03/17/2013, 03/20/2013, 03/27/2013, 04/11/2013  . Tdap 03/17/2013    Diabetes She presents for her follow-up diabetic visit. She has type 2 diabetes mellitus. Onset time: She was diagnosed at approximate age of 49 years. Her disease course has been worsening. There are no hypoglycemic associated symptoms. Pertinent negatives for hypoglycemia include no confusion, headaches, pallor or seizures. Associated symptoms include fatigue, polydipsia and polyuria. Pertinent negatives for diabetes include no chest pain and no polyphagia. There are no hypoglycemic complications. Symptoms are improving. There are no diabetic complications. Risk factors for coronary artery disease include diabetes mellitus, hypertension and  obesity. Current diabetic treatment includes insulin injections (She was on Lantus 10 units nightly. she reports intolerance to MTF.[name]). Her weight is increasing steadily. She is following a generally unhealthy diet. When asked about meal planning, she reported none. She has not had a previous visit with a dietitian. She rarely participates in exercise. Her home blood glucose trend is decreasing steadily. Her breakfast blood glucose range is generally 180-200 mg/dl. Her lunch blood glucose range is generally >200 mg/dl. Her dinner blood glucose range is generally >200 mg/dl. Her bedtime blood glucose range is generally >200 mg/dl. Her overall blood glucose range is >200 mg/dl. (She presents with improving fasting, however still significantly above target postprandial glycemic profile.  She did not have any hypoglycemia.  Her Lantus was adjusted to 30 units since last visit.) An ACE inhibitor/angiotensin II receptor blocker is not being taken.  Hypertension This is a chronic problem. The current episode started more than 1 year ago. The problem is controlled. Pertinent negatives include no chest pain, headaches, palpitations or shortness of breath. Risk factors for coronary artery disease include diabetes mellitus, family history, obesity and sedentary lifestyle. Past treatments include calcium channel blockers.     Review of Systems  Constitutional: Positive for fatigue. Negative for chills, fever and unexpected weight change.  HENT: Negative for trouble swallowing and voice change.   Eyes: Negative for visual disturbance.  Respiratory: Negative for cough, shortness of breath and wheezing.   Cardiovascular: Negative for chest pain, palpitations and leg swelling.  Gastrointestinal: Negative for diarrhea, nausea and vomiting.  Endocrine: Positive for polydipsia and polyuria. Negative for cold intolerance, heat intolerance and polyphagia.  Musculoskeletal: Negative for arthralgias and myalgias.  Skin:  Negative for color change, pallor, rash and wound.  Neurological: Negative for seizures and headaches.  Psychiatric/Behavioral: Negative for confusion and suicidal ideas.    Objective:    Vitals with BMI 10/29/2019 10/27/2019 10/22/2019  Height 5\' 2"  5\' 2"  5\' 2"   Weight 197 lbs 3 oz 196 lbs 3 oz 194 lbs 10 oz  BMI 36.06 45.40 98.11  Systolic 914 - 782  Diastolic 76 - 72  Pulse 92 - 84    BP 100/76   Pulse 92   Ht 5\' 2"  (1.575 m)   Wt 197 lb 3.2 oz (89.4 kg)   LMP 11/14/2011   BMI 36.07 kg/m   Wt Readings from Last 3 Encounters:  10/29/19 197 lb 3.2 oz (89.4 kg)  10/27/19 196 lb 3.2 oz (89 kg)  10/22/19 194 lb 9.6 oz (88.3 kg)     Physical Exam Constitutional:      Appearance: She  is well-developed.  HENT:     Head: Normocephalic and atraumatic.  Neck:     Thyroid: No thyromegaly.     Trachea: No tracheal deviation.  Cardiovascular:     Rate and Rhythm: Normal rate and regular rhythm.  Pulmonary:     Effort: Pulmonary effort is normal.  Abdominal:     Tenderness: There is no abdominal tenderness. There is no guarding.  Musculoskeletal:        General: Normal range of motion.     Cervical back: Normal range of motion and neck supple.  Skin:    General: Skin is warm and dry.     Coloration: Skin is not pale.     Findings: No erythema or rash.  Neurological:     Mental Status: She is alert and oriented to person, place, and time.     Cranial Nerves: No cranial nerve deficit.     Coordination: Coordination normal.     Deep Tendon Reflexes: Reflexes are normal and symmetric.  Psychiatric:        Judgment: Judgment normal.       CMP ( most recent) CMP     Component Value Date/Time   NA 140 04/25/2018 0433   K 3.5 04/25/2018 0433   CL 105 04/25/2018 0433   CO2 29 04/25/2018 0433   GLUCOSE 110 (H) 04/25/2018 0433   BUN 10 04/25/2018 0433   CREATININE 0.63 04/25/2018 0433   CALCIUM 8.5 (L) 04/25/2018 0433   PROT 6.8 04/25/2018 0433   ALBUMIN 3.1 (L)  04/25/2018 0433   AST 19 04/25/2018 0433   ALT 17 04/25/2018 0433   ALKPHOS 70 04/25/2018 0433   BILITOT 0.3 04/25/2018 0433   GFRNONAA >60 04/25/2018 0433   GFRAA >60 04/25/2018 0433     Diabetic Labs (most recent): Lab Results  Component Value Date   HGBA1C >15.5 10/08/2019        Assessment & Plan:   1. Uncontrolled type 2 diabetes mellitus with hyperglycemia ([place])  - RAJNI HOLSWORTH has currently uncontrolled symptomatic type 2 DM since  49 years of age,  with most recent A1c of >15.5%. Recent labs reviewed. She presents with improving fasting, however still significantly above target postprandial glycemic profile.  She did not have any hypoglycemia.  Her Lantus was adjusted to 30 units since last visit.  She insists she saw a BG of 82 today AM. She did not bring any meter. - I had a long discussion with her about the progressive nature of diabetes and the pathology behind its complications. -She does not report gross complications of diabetes, however she remains at a high risk for more acute and chronic complications which include CAD, CVA, CKD, retinopathy, and neuropathy. These are all discussed in detail with her.  - I have counseled her on diet  and weight management  by adopting a carbohydrate restricted/protein rich diet. Patient is encouraged to switch to  unprocessed or minimally processed     complex starch and increased protein intake (animal or plant source), fruits, and vegetables. -  she is advised to stick to a routine mealtimes to eat 3 meals  a day and avoid unnecessary snacks ( to snack only to correct hypoglycemia).  - she  admits there is a room for improvement in her diet and drink choices. -  Suggestion is made for her to avoid simple carbohydrates  from her diet including Cakes, Sweet Desserts / Pastries, Ice Cream, Soda (diet and regular), Sweet Tea, Candies,  Chips, Cookies, Sweet Pastries,  Store Bought Juices, Alcohol in Excess of  1-2 drinks a day,  Artificial Sweeteners, Coffee Creamer, and "Sugar-free" Products. This will help patient to have stable blood glucose profile and potentially avoid unintended weight gain.   - she will be scheduled with Jearld Fenton, RDN, CDE for diabetes education.  - I have approached her with the following individualized plan to manage  her diabetes and patient agrees:   -In light of her prevailing glycemic burden, she will continue to need insulin treatment in order for her to achieve and maintain control of diabetes to target.  -She did not have any hypoglycemia, will tolerate a higher dose of Lantus.  I discussed and increase her Lantus to 40 units nightly, associated with monitoring of blood glucose 3-4 times a day-before meals and at bedtime.   In light of the fact that she does not tolerate Metformin, she will be considered for low-dose glipizide.  I discussed and added glipizide 5 mg XL p.o. daily at breakfast.  -She will be considered for bolus insulin if she continues to have significant postprandial hyperglycemia.  - she is encouraged to call clinic for blood glucose levels less than 70 or above 200 mg /dl.   - she will be considered for incretin therapy as appropriate next visit.  - Specific targets for  A1c;  LDL, HDL,  and Triglycerides were discussed with the patient.  2) Blood Pressure /Hypertension: Her blood pressure is controlled to target..   she is advised to continue her current medications including amlodipine 10 mg p.o. daily with breakfast . 3) Lipids/Hyperlipidemia: She does not have lipid panel to review.  She is not on antilipid management at this time.  She will be considered for fasting lipid panel on next visit.  4)  Weight/Diet:  Body mass index is 36.07 kg/m.  -   clearly complicating her diabetes care.   she is  a candidate for weight loss. I discussed with her the fact that loss of 5 - 10% of her  current body weight will have the most impact on her diabetes management.   Exercise, and detailed carbohydrates information provided  -  detailed on discharge instructions.  5) Chronic Care/Health Maintenance:  -she  Is not on ACEI/ARB and Statin medications and  is encouraged to initiate and continue to follow up with Ophthalmology, Dentist,  Podiatrist at least yearly or according to recommendations, and advised to   stay away from smoking. I have recommended yearly flu vaccine and pneumonia vaccine at least every 5 years; moderate intensity exercise for up to 150 minutes weekly; and  sleep for at least 7 hours a day.  - she is  advised to maintain close follow up with Asencion Noble, MD for primary care needs, as well as her other providers for optimal and coordinated care.  - Time spent on this patient care encounter:  35 min, of which > 50% was spent in  counseling and the rest reviewing her blood glucose logs , discussing her hypoglycemia and hyperglycemia episodes, reviewing her current and  previous labs / studies  ( including abstraction from other facilities) and medications  doses and developing a  long term treatment plan and documenting her care.   Please refer to Patient Instructions for Blood Glucose Monitoring and Insulin/Medications Dosing Guide"  in media tab for additional information. Please  also refer to " Patient Self Inventory" in the Media  tab for reviewed elements of pertinent patient history.  [name] participated in the discussions, expressed understanding, and voiced agreement with the above plans.  All questions were answered to her satisfaction. she is encouraged to contact clinic should she have any questions or concerns prior to her return visit.    Follow up plan: - Return in about 3 months (around 01/28/2020) for F/U with Pre-visit Labs, Meter, Logs, A1c here.Glade Lloyd, MD [facility] [address] [place], [place] [address] Phone: [phone]  Fax: [phone]     10/29/2019, 4:10 PM  This note was partially dictated with voice recognition software. Similar sounding words can be transcribed inadequately or may not  be corrected upon review.

## 2019-11-17 ENCOUNTER — Ambulatory Visit: Payer: BC Managed Care – PPO | Admitting: Gastroenterology

## 2019-11-18 DIAGNOSIS — M542 Cervicalgia: Secondary | ICD-10-CM | POA: Diagnosis not present

## 2019-11-18 DIAGNOSIS — M545 Low back pain, unspecified: Secondary | ICD-10-CM | POA: Diagnosis not present

## 2019-11-18 DIAGNOSIS — E1165 Type 2 diabetes mellitus with hyperglycemia: Secondary | ICD-10-CM | POA: Diagnosis not present

## 2019-12-02 DIAGNOSIS — N760 Acute vaginitis: Secondary | ICD-10-CM | POA: Diagnosis not present

## 2019-12-02 DIAGNOSIS — Z6836 Body mass index (BMI) 36.0-36.9, adult: Secondary | ICD-10-CM | POA: Diagnosis not present

## 2020-01-02 ENCOUNTER — Ambulatory Visit: Payer: BC Managed Care – PPO | Admitting: Gastroenterology

## 2020-01-21 DIAGNOSIS — E1165 Type 2 diabetes mellitus with hyperglycemia: Secondary | ICD-10-CM | POA: Diagnosis not present

## 2020-01-22 LAB — LIPID PANEL
Chol/HDL Ratio: 5.8 ratio — ABNORMAL HIGH (ref 0.0–4.4)
Cholesterol, Total: 233 mg/dL — ABNORMAL HIGH (ref 100–199)
HDL: 40 mg/dL (ref >39)
LDL Chol Calc (NIH): 169 mg/dL — ABNORMAL HIGH (ref 0–99)
Triglycerides: 129 mg/dL (ref 0–149)
VLDL Cholesterol Cal: 24 mg/dL (ref 5–40)

## 2020-01-22 LAB — COMPREHENSIVE METABOLIC PANEL
ALT: 11 IU/L (ref 0–32)
AST: 16 IU/L (ref 0–40)
Albumin/Globulin Ratio: 1.1 — ABNORMAL LOW (ref 1.2–2.2)
Albumin: 3.9 g/dL (ref 3.8–4.9)
Alkaline Phosphatase: 99 IU/L (ref 44–121)
BUN/Creatinine Ratio: 21 (ref 9–23)
BUN: 16 mg/dL (ref 6–24)
Bilirubin Total: 0.3 mg/dL (ref 0.0–1.2)
CO2: 26 mmol/L (ref 20–29)
Calcium: 9.2 mg/dL (ref 8.7–10.2)
Chloride: 103 mmol/L (ref 96–106)
Creatinine, Ser: 0.78 mg/dL (ref 0.57–1.00)
GFR calc Af Amer: 102 mL/min/{1.73_m2} (ref >59)
GFR calc non Af Amer: 88 mL/min/{1.73_m2} (ref >59)
Globulin, Total: 3.6 g/dL (ref 1.5–4.5)
Glucose: 99 mg/dL (ref 65–99)
Potassium: 4.7 mmol/L (ref 3.5–5.2)
Sodium: 144 mmol/L (ref 134–144)
Total Protein: 7.5 g/dL (ref 6.0–8.5)

## 2020-01-22 LAB — T4, FREE: Free T4: 1.05 ng/dL (ref 0.82–1.77)

## 2020-01-22 LAB — TSH: TSH: 1.14 u[IU]/mL (ref 0.450–4.500)

## 2020-01-29 ENCOUNTER — Other Ambulatory Visit: Payer: Self-pay

## 2020-01-29 ENCOUNTER — Encounter: Payer: Self-pay | Admitting: Nurse Practitioner

## 2020-01-29 ENCOUNTER — Ambulatory Visit (INDEPENDENT_AMBULATORY_CARE_PROVIDER_SITE_OTHER): Payer: BC Managed Care – PPO | Admitting: Nurse Practitioner

## 2020-01-29 VITALS — BP 107/73 | HR 90 | Ht 62.0 in | Wt 207.0 lb

## 2020-01-29 DIAGNOSIS — E1165 Type 2 diabetes mellitus with hyperglycemia: Secondary | ICD-10-CM | POA: Diagnosis not present

## 2020-01-29 DIAGNOSIS — I10 Essential (primary) hypertension: Secondary | ICD-10-CM | POA: Diagnosis not present

## 2020-01-29 LAB — POCT GLYCOSYLATED HEMOGLOBIN (HGB A1C): HbA1c, POC (controlled diabetic range): 8 % — AB (ref 0.0–7.0)

## 2020-01-29 LAB — POCT UA - MICROALBUMIN
Albumin/Creatinine Ratio, Urine, POC: <30
Creatinine, POC: 100 mg/dL
Microalbumin Ur, POC: 30 mg/L

## 2020-01-29 MED ORDER — ROSUVASTATIN CALCIUM 5 MG PO TABS: 5.0000 mg | ORAL_TABLET | Freq: Every day | ORAL | 3 refills | Status: DC

## 2020-01-29 MED ORDER — GLIPIZIDE ER 5 MG PO TB24: 5.0000 mg | ORAL_TABLET | Freq: Every day | ORAL | 1 refills | Status: DC

## 2020-01-29 NOTE — Patient Instructions (Signed)
Advice for Weight Management  -For most of us the best way to lose weight is by diet management. Generally speaking, diet management means consuming less calories intentionally which over time brings about progressive weight loss.  This can be achieved more effectively by restricting carbohydrate consumption to the minimum possible.  So, it is critically important to know your numbers: how much calorie you are consuming and how much calorie you need. More importantly, our carbohydrates sources should be unprocessed or minimally processed complex starch food items.   Sometimes, it is important to balance nutrition by increasing protein intake (animal or plant source), fruits, and vegetables.  -Sticking to a routine mealtime to eat 3 meals a day and avoiding unnecessary snacks is shown to have a big role in weight control. Under normal circumstances, the only time we lose real weight is when we are hungry, so allow hunger to take place- hunger means no food between meal times, only water.  It is not advisable to starve.   -It is better to avoid simple carbohydrates including: Cakes, Sweet Desserts, Ice Cream, Soda (diet and regular), Sweet Tea, Candies, Chips, Cookies, Store Bought Juices, Alcohol in Excess of  1-2 drinks a day, Artificial Sweeteners, Doughnuts, Coffee Creamers, "Sugar-free" Products, etc, etc.  This is not a complete list.....    -Consulting with certified diabetes educators is proven to provide you with the most accurate and current information on diet.  Also, you may be  interested in discussing diet options/exchanges , we can schedule a visit with Sonya Kline, RDN, CDE for individualized nutrition education.  -Exercise: If you are able: 30 -60 minutes a day ,4 days a week, or 150 minutes a week.  The longer the better.  Combine stretch, strength, and aerobic activities.  If you were told in the past that you have high risk for cardiovascular diseases, you may seek evaluation by  your heart doctor prior to initiating moderate to intense exercise programs.    

## 2020-01-29 NOTE — Progress Notes (Signed)
01/29/2020, 11:05 AM  Endocrinology follow-up note   Subjective:    Patient ID: Sonya Kline, female    DOB: 1970-03-18.  Sonya Kline is being seen in follow-up after she was seen in consultation for management of currently uncontrolled symptomatic diabetes requested by  Asencion Noble, MD.   Past Medical History:  Diagnosis Date   Diabetes mellitus, type II ([place])    Diverticulosis    HTN (hypertension)     Past Surgical History:  Procedure Laterality Date   ABDOMINAL HYSTERECTOMY     CESAREAN SECTION     x 2   COLONOSCOPY N/A 08/22/2019   Procedure: COLONOSCOPY;  Surgeon: Daneil Dolin, MD;  [facility];  Service: Endoscopy;  Laterality: N/A;  9:30   POLYPECTOMY  08/22/2019   Procedure: POLYPECTOMY;  Surgeon: Daneil Dolin, MD;  [facility];  Service: Endoscopy;;    Social History   Socioeconomic History   Marital status: Married    Spouse name: Not on file   Number of children: Not on file   Years of education: college   Highest education level: Not on file  Occupational History   Not on file  Tobacco Use   Smoking status: Never Smoker   Smokeless tobacco: Never Used  Vaping Use   Vaping Use: Never used  Substance and Sexual Activity   Alcohol use: No   Drug use: No   Sexual activity: Yes    Birth control/protection: None, Surgical  Other Topics Concern   Not on file  Social History Narrative   Not on file   Social Determinants of Health   Financial Resource Strain: Not on file  Food Insecurity: Not on file  Transportation Needs: Not on file  Physical Activity: Not on file  Stress: Not on file  Social Connections: Not on file    Family History  Problem Relation Age of Onset   Diabetes Mother    Hypertension Mother    Hypertension Father     Outpatient Encounter Medications as of 01/29/2020  Medication Sig    amLODipine (NORVASC) 10 MG tablet Take 10 mg by mouth daily.    BD PEN NEEDLE NANO 2ND GEN 32G X 4 MM MISC SMARTSIG:Pre-Filled Pen Syringe Injection Daily   insulin glargine (LANTUS) 100 UNIT/ML Solostar Pen Inject 40 Units into the skin at bedtime.   [DISCONTINUED] JARDIANCE 10 MG TABS tablet Take 10 mg by mouth daily.   glipiZIDE (GLUCOTROL XL) 5 MG 24 hr tablet Take 1 tablet (5 mg total) by mouth daily with breakfast.   rosuvastatin (CRESTOR) 5 MG tablet Take 1 tablet (5 mg total) by mouth daily.   [DISCONTINUED] glipiZIDE (GLUCOTROL XL) 5 MG 24 hr tablet Take 1 tablet (5 mg total) by mouth daily with breakfast.   No facility-administered encounter medications on file as of 01/29/2020.    ALLERGIES: Allergies  Allergen Reactions   Contrast Media [Iodinated Diagnostic Agents] Hives   Prednisone Rash    VACCINATION STATUS: Immunization History  Administered Date(s) Administered   [place], IM 03/17/2013, 03/20/2013, 03/27/2013, 04/11/2013   Tdap 03/17/2013    Diabetes She presents  for her follow-up diabetic visit. She has type 2 diabetes mellitus. Onset time: She was diagnosed at approximate age of 49 years. Her disease course has been improving. There are no hypoglycemic associated symptoms. Pertinent negatives for hypoglycemia include no confusion, headaches, pallor or seizures. Pertinent negatives for diabetes include no chest pain, no fatigue, no polydipsia, no polyphagia and no polyuria. There are no hypoglycemic complications. Symptoms are improving. There are no diabetic complications. Risk factors for coronary artery disease include diabetes mellitus, hypertension, obesity and dyslipidemia. Current diabetic treatment includes insulin injections and oral agent (monotherapy). She is compliant with treatment most of the time. Her weight is increasing steadily. She is following a generally unhealthy diet. When asked about meal planning, she reported none. She has not had a  previous visit with a dietitian. She rarely participates in exercise. Her home blood glucose trend is decreasing steadily. Her breakfast blood glucose range is generally 110-130 mg/dl. Her lunch blood glucose range is generally 90-110 mg/dl. Her bedtime blood glucose range is generally 90-110 mg/dl. Her overall blood glucose range is 130-140 mg/dl. (She presents today, accompanied by her son, with her logs showing drastically improved glycemic profile.  Her POCT A1c today is 8%, improved from last visit of >15%.  She reports compliance with her medications and is working on her diet.  There are no episodes of hypoglycemia documented or reported.  Her PCP put her on Jardiance therefore she never picked up the Glipizide prescribed by [place]. ) An ACE inhibitor/angiotensin II receptor blocker is not being taken.  Hypertension This is a chronic problem. The current episode started more than 1 year ago. The problem is controlled. Pertinent negatives include no chest pain, headaches, palpitations or shortness of breath. Risk factors for coronary artery disease include diabetes mellitus, family history, obesity and sedentary lifestyle. Past treatments include calcium channel blockers.    Review of systems  Constitutional: + Minimally fluctuating body weight,  current Body mass index is 37.86 kg/m. , no fatigue, no subjective hyperthermia, no subjective hypothermia Eyes: no blurry vision, no xerophthalmia ENT: no sore throat, no nodules palpated in throat, no dysphagia/odynophagia, no hoarseness Cardiovascular: no chest pain, no shortness of breath, no palpitations, no leg swelling Respiratory: no cough, no shortness of breath Gastrointestinal: no nausea/vomiting/diarrhea Musculoskeletal: no muscle/joint aches Skin: no rashes, no hyperemia Neurological: no tremors, no numbness, no tingling, no dizziness Psychiatric: no depression, no anxiety   Objective:    BP 107/73 (BP Location: Right Arm, Patient  Position: Sitting)    Pulse 90    Ht 5\' 2"  (1.575 m)    Wt 207 lb (93.9 kg)    LMP 11/14/2011    BMI 37.86 kg/m   Wt Readings from Last 3 Encounters:  01/29/20 207 lb (93.9 kg)  10/29/19 197 lb 3.2 oz (89.4 kg)  10/27/19 196 lb 3.2 oz (89 kg)    BP Readings from Last 3 Encounters:  01/29/20 107/73  10/29/19 100/76  10/22/19 100/72     Physical Exam- Limited  Constitutional:  Body mass index is 37.86 kg/m. , not in acute distress, normal state of mind Eyes:  EOMI, no exophthalmos Neck: Supple Thyroid: No gross goiter Cardiovascular: RRR, no murmers, rubs, or gallops, no edema Respiratory: Adequate breathing efforts, no crackles, rales, rhonchi, or wheezing Musculoskeletal: no gross deformities, strength intact in all four extremities, no gross restriction of joint movements Skin:  no rashes, no hyperemia Neurological: no tremor with outstretched hands    CMP ( most recent) CMP  Component Value Date/Time   NA 144 01/21/2020 0737   K 4.7 01/21/2020 0737   CL 103 01/21/2020 0737   CO2 26 01/21/2020 0737   GLUCOSE 99 01/21/2020 0737   GLUCOSE 110 (H) 04/25/2018 0433   BUN 16 01/21/2020 0737   CREATININE 0.78 01/21/2020 0737   CALCIUM 9.2 01/21/2020 0737   PROT 7.5 01/21/2020 0737   ALBUMIN 3.9 01/21/2020 0737   AST 16 01/21/2020 0737   ALT 11 01/21/2020 0737   ALKPHOS 99 01/21/2020 0737   BILITOT 0.3 01/21/2020 0737   GFRNONAA 88 01/21/2020 0737   GFRAA 102 01/21/2020 0737     Diabetic Labs (most recent): Lab Results  Component Value Date   HGBA1C 8.0 (A) 01/29/2020   HGBA1C >15.5 10/08/2019        Assessment & Plan:   1) Uncontrolled type 2 diabetes mellitus with hyperglycemia ([place])  - Sonya Kline has currently uncontrolled symptomatic type 2 DM since 49 years of age.  Recent labs reviewed.  Urine microalbumin 30 (normal).  She presents today, accompanied by her son, with her logs showing drastically improved glycemic profile.  Her POCT A1c  today is 8%, improved from last visit of >15%.  She reports compliance with her medications and is working on her diet.  There are no episodes of hypoglycemia documented or reported.  Her PCP put her on Jardiance therefore she never picked up the Glipizide prescribed by [place].   - I had a long discussion with her about the progressive nature of diabetes and the pathology behind its complications. -She does not report gross complications of diabetes, however she remains at a high risk for more acute and chronic complications which include CAD, CVA, CKD, retinopathy, and neuropathy. These are all discussed in detail with her.  - Nutritional counseling repeated at each appointment due to patients tendency to fall back in to old habits.  - The patient admits there is a room for improvement in their diet and drink choices. -  Suggestion is made for the patient to avoid simple carbohydrates from their diet including Cakes, Sweet Desserts / Pastries, Ice Cream, Soda (diet and regular), Sweet Tea, Candies, Chips, Cookies, Sweet Pastries,  Store Bought Juices, Alcohol in Excess of  1-2 drinks a day, Artificial Sweeteners, Coffee Creamer, and "Sugar-free" Products. This will help patient to have stable blood glucose profile and potentially avoid unintended weight gain.   - I encouraged the patient to switch to  unprocessed or minimally processed complex starch and increased protein intake (animal or plant source), fruits, and vegetables.   - Patient is advised to stick to a routine mealtimes to eat 3 meals  a day and avoid unnecessary snacks ( to snack only to correct hypoglycemia).  - she is following with Jearld Fenton, RDN, CDE for diabetes education.  - I have approached her with the following individualized plan to manage  her diabetes and patient agrees:   -In light of her prevailing glycemic burden, she will continue to need insulin treatment in order for her to achieve and maintain control of diabetes  to target.   -She is tolerating higher dose of Lantus well.  She is advised to continue Lantus 40 units SQ daily at bedtime.  I advised her to stop Jardiance (due to risk) and start the Glipizide 5 mg XL PO daily that was prescribed last visit.    -She will be considered for bolus insulin if she continues to have significant postprandial hyperglycemia.  -  she is encouraged to call clinic for blood glucose levels less than 70 or above 200 mg /dl.  - she will be considered for incretin therapy as appropriate next visit.  - Specific targets for  A1c;  LDL, HDL,  and Triglycerides were discussed with the patient.  2) Blood Pressure /Hypertension:  Her blood pressure is controlled to target.  She is advised to continue Norvasc 10 mg po daily.  3) Lipids/Hyperlipidemia:  Her most recent lipid panel from 01/21/20 shows uncontrolled LDL at 169.  She is not on any lipid lowering agents at this time.  I discussed and initiated low dose Crestor 5 mg po daily at bedtime.  Side effects and precautions discussed with her.  4)  Weight/Diet:  Her Body mass index is 37.86 kg/m.  -   clearly complicating her diabetes care.   she is a candidate for weight loss. I discussed with her the fact that loss of 5 - 10% of her  current body weight will have the most impact on her diabetes management.  Exercise, and detailed carbohydrates information provided  -  detailed on discharge instructions.  5) Chronic Care/Health Maintenance: -she is not on ACEI/ARB or Statin medications and  is encouraged to initiate and continue to follow up with Ophthalmology, Dentist,  Podiatrist at least yearly or according to recommendations, and advised to   stay away from smoking. I have recommended yearly flu vaccine and pneumonia vaccine at least every 5 years; moderate intensity exercise for up to 150 minutes weekly; and  sleep for at least 7 hours a day.  - she is  advised to maintain close follow up with Asencion Noble, MD for primary  care needs, as well as her other providers for optimal and coordinated care.  - Time spent on this patient care encounter:  30 min, of which > 50% was spent in  counseling and the rest reviewing her blood glucose logs , discussing her hypoglycemia and hyperglycemia episodes, reviewing her current and  previous labs / studies  ( including abstraction from other facilities) and medications  doses and developing a  long term treatment plan and documenting her care.   Please refer to Patient Instructions for Blood Glucose Monitoring and Insulin/Medications Dosing Guide"  in media tab for additional information. Please  also refer to " Patient Self Inventory" in the Media  tab for reviewed elements of pertinent patient history.  [name] participated in the discussions, expressed understanding, and voiced agreement with the above plans.  All questions were answered to her satisfaction. she is encouraged to contact clinic should she have any questions or concerns prior to her return visit.    Follow up plan: - Return in about 4 months (around 05/29/2020) for Diabetes follow up with A1c in office, No previsit labs, Bring glucometer and logs, ABI next visit.  Rayetta Pigg, [facility] [address] [place], [place] [address] Phone: [phone] Fax: [phone]  01/29/2020, 11:05 AM

## 2020-02-18 ENCOUNTER — Telehealth: Payer: Self-pay | Admitting: Nurse Practitioner

## 2020-02-18 DIAGNOSIS — E1165 Type 2 diabetes mellitus with hyperglycemia: Secondary | ICD-10-CM

## 2020-02-18 MED ORDER — INSULIN GLARGINE 100 UNIT/ML SOLOSTAR PEN: 40.0000 [IU] | PEN_INJECTOR | Freq: Every day | SUBCUTANEOUS | 0 refills | Status: DC

## 2020-02-18 NOTE — Telephone Encounter (Signed)
Rx refill sent.

## 2020-02-18 NOTE — Telephone Encounter (Signed)
Pt is needing a refill on her Lantus because Dr. Ouida Sills originally had her on 15 units and when we started managing the medicine we changed it to 40 units. Pt is running out of medicine quicker because she is using more than the 15 that the RX is written for that she has.    [id number] - [place], [place] - [address] AT [address] Phone:  [phone]  Fax:  [phone]

## 2020-03-04 IMAGING — US ULTRASOUND ABDOMEN COMPLETE
1 series · 13 of 25 positions shown · non-contrast
Comparison: Prior CT from 04/23/2018.

CLINICAL DATA: Initial evaluation for acute right upper quadrant
pain for 3 days, nausea.

EXAM:
ABDOMEN ULTRASOUND COMPLETE

[Series 1: ultrasound abdomen complete · 13 of 122 slices shown]
[im 1/122]
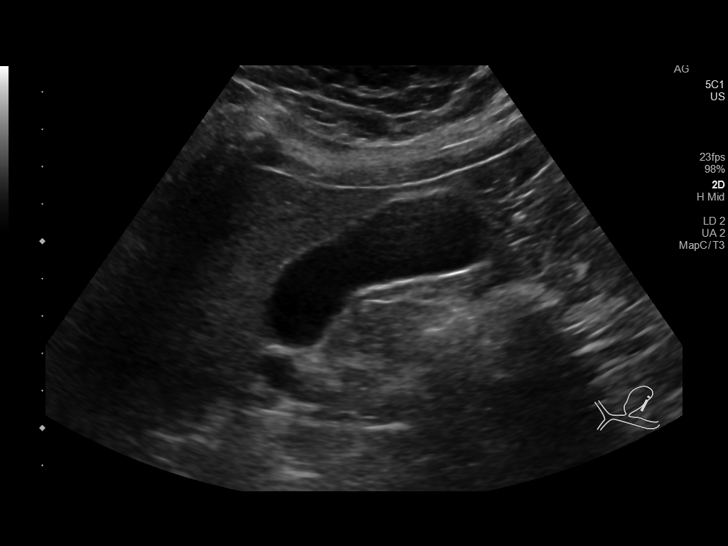
[im 11/122]
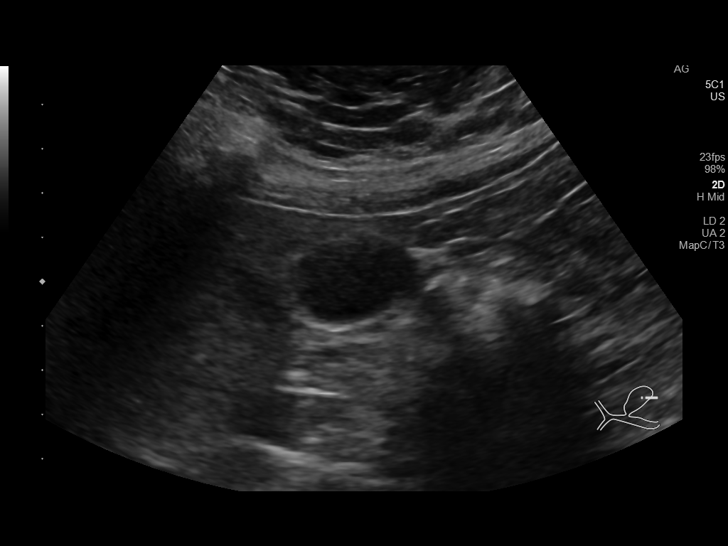
[im 21/122]
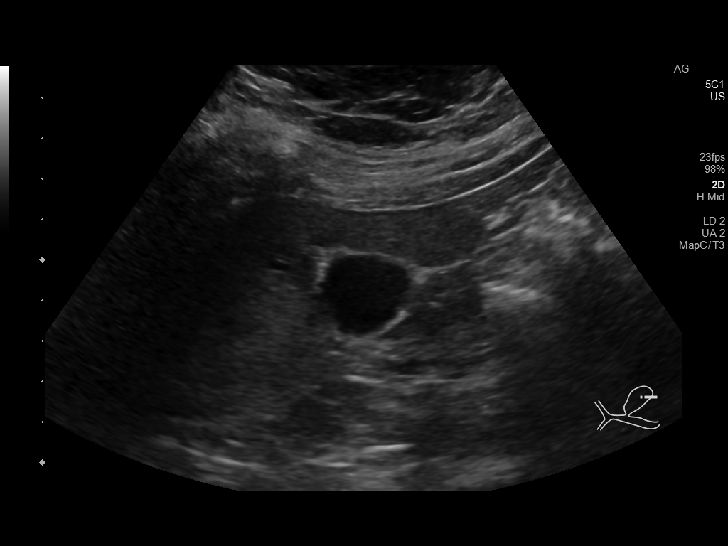
[im 31/122]
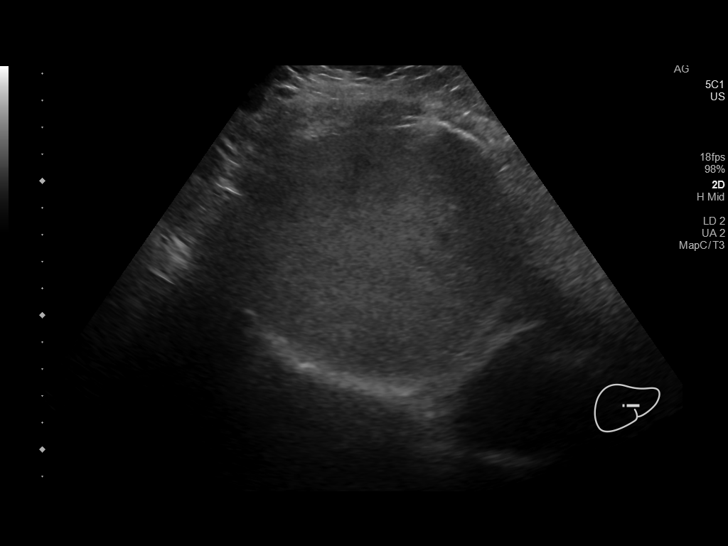
[im 41/122]
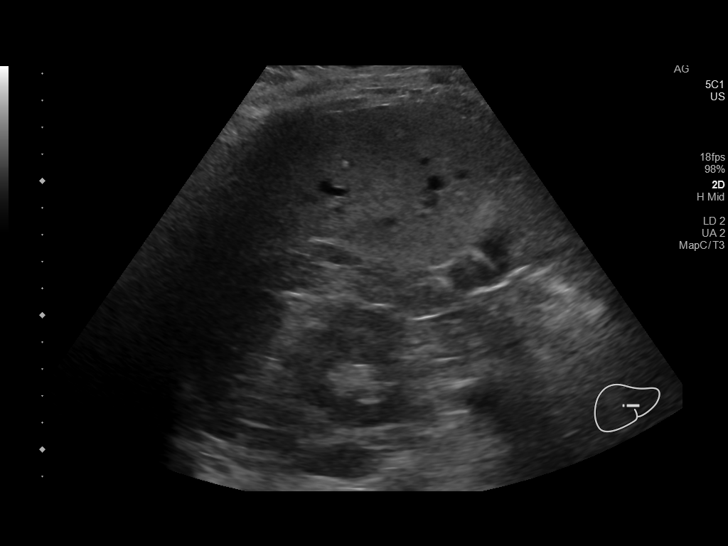
[im 51/122]
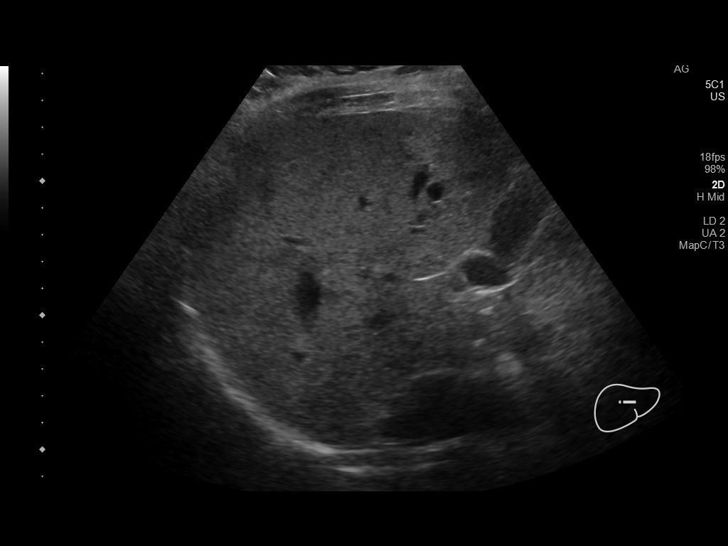
[im 61/122]
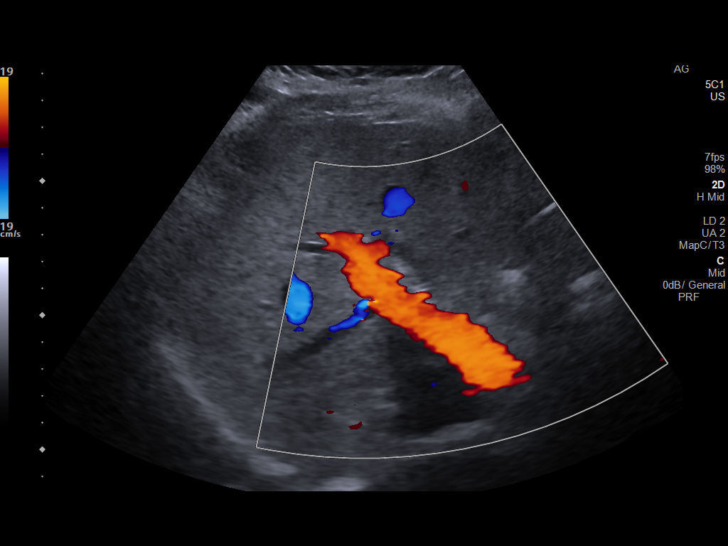
[im 71/122]
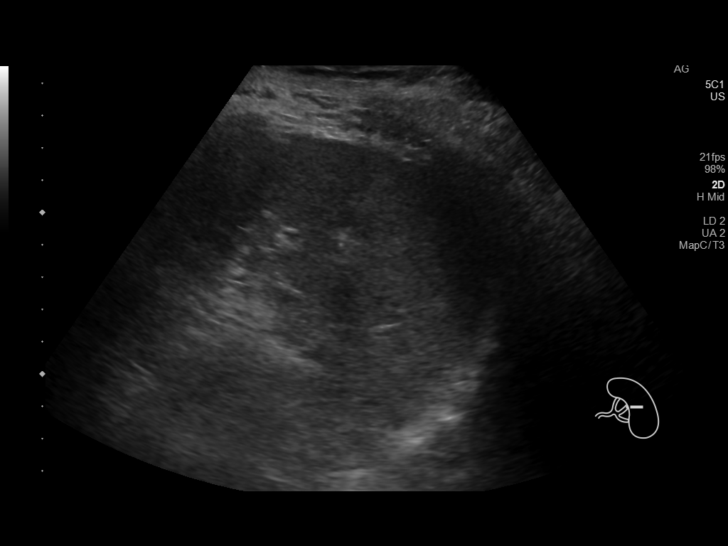
[im 81/122]
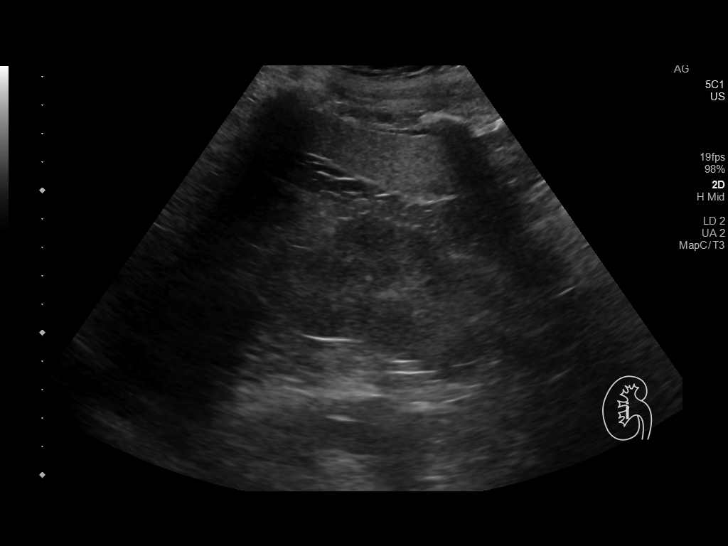
[im 91/122]
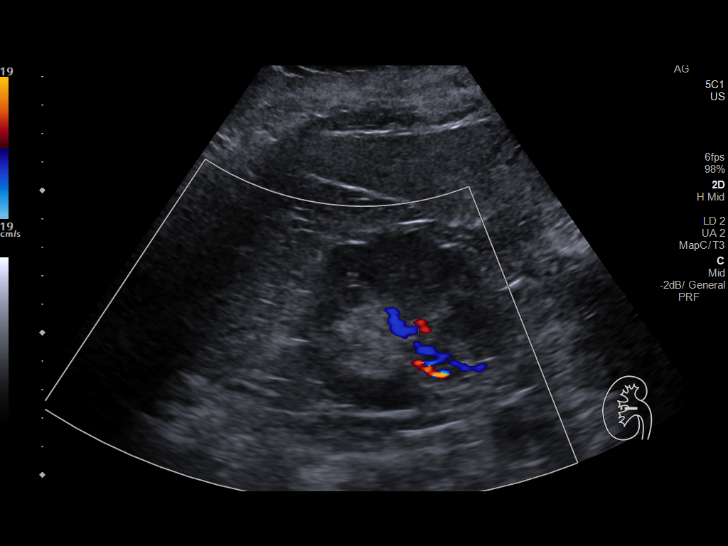
[im 101/122]
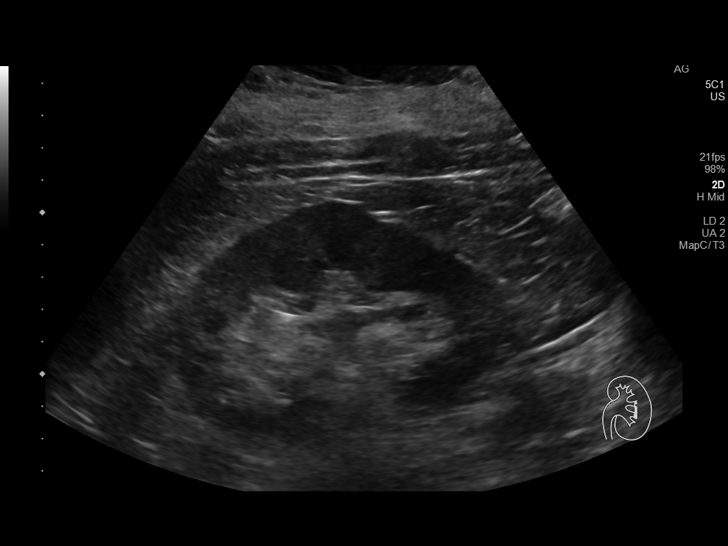
[im 111/122]
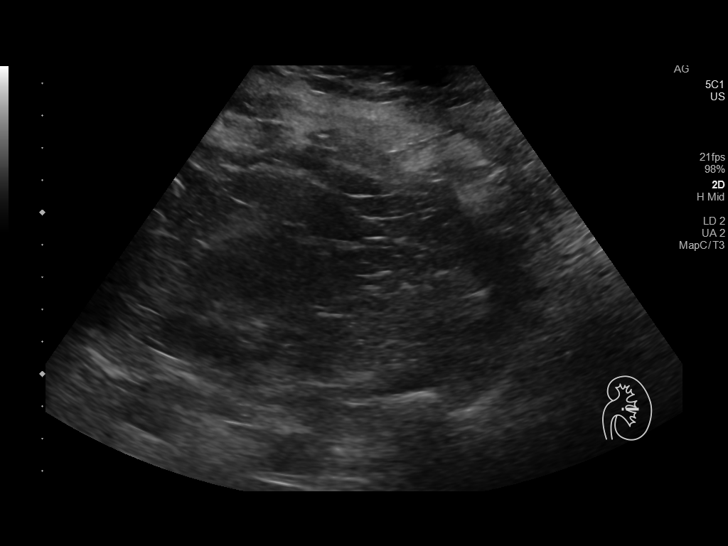
[im 122/122]
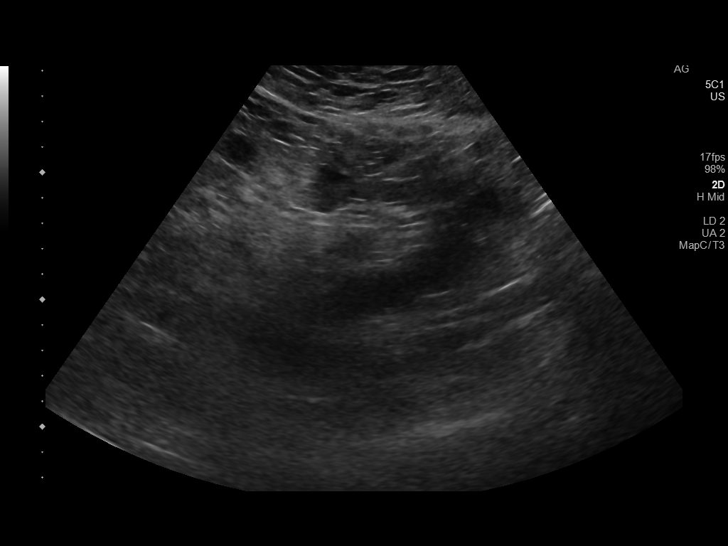

[13 of 25 positions shown; findings below may reference images not displayed]

FINDINGS: Gallbladder: No gallstones or wall thickening visualized. No free
pericholecystic fluid. No sonographic Murphy sign noted by
sonographer.

Common bile duct: Diameter: 1.9 mm

Liver: No focal lesion identified. Within normal limits in
parenchymal echogenicity. Portal vein is patent on color Doppler
imaging with normal direction of blood flow towards the liver.

IVC: No abnormality visualized.

Pancreas: Visualized portion unremarkable.

Spleen: Size and appearance within normal limits.

Right Kidney: Length: 11.5 cm. Echogenicity within normal limits. No
mass or hydronephrosis visualized. 7 mm shadowing echogenic stone
present within the interpolar right kidney.

Left Kidney: Length: 11.1 cm. Echogenicity within normal limits. No
mass or hydronephrosis visualized. Previously noted left renal
nephrolithiasis not visualized by sonography.

Abdominal aorta: No aneurysm visualized.

Other findings: None.
IMPRESSION: 1. No acute abnormality within the abdomen. Specifically, normal
sonographic appearance of the gallbladder with no evidence for
cholelithiasis, acute cholecystitis, or biliary dilatation.
2. Nonobstructive right renal nephrolithiasis.
3. Otherwise unremarkable abdominal ultrasound.

## 2020-03-15 ENCOUNTER — Other Ambulatory Visit (HOSPITAL_COMMUNITY): Payer: Self-pay | Admitting: Internal Medicine

## 2020-03-15 DIAGNOSIS — Z1231 Encounter for screening mammogram for malignant neoplasm of breast: Secondary | ICD-10-CM

## 2020-03-22 ENCOUNTER — Other Ambulatory Visit: Payer: Self-pay

## 2020-03-22 ENCOUNTER — Ambulatory Visit (HOSPITAL_COMMUNITY)
Admission: RE | Admit: 2020-03-22 | Discharge: 2020-03-22 | Disposition: A | Discharge status: Home / Self Care | Payer: BC Managed Care – PPO | Source: Ambulatory Visit | Attending: Internal Medicine | Admitting: Internal Medicine

## 2020-03-22 DIAGNOSIS — Z1231 Encounter for screening mammogram for malignant neoplasm of breast: Secondary | ICD-10-CM | POA: Insufficient documentation

## 2020-03-23 ENCOUNTER — Ambulatory Visit: Payer: BC Managed Care – PPO | Admitting: "Endocrinology

## 2020-04-14 ENCOUNTER — Telehealth: Payer: Self-pay

## 2020-04-14 NOTE — Telephone Encounter (Signed)
I received a VM from what I think was " [facility] "-the lady was talking so fast. She said she received a order for Dexcom G6-the prescription was signed by Loree Fee but the ordering provider was Nida. They will not fill it like this. You can reach her at  [phone] ext [phone]

## 2020-04-14 NOTE — Telephone Encounter (Signed)
I returned call and update the info over the phone, they are processing the order.

## 2020-05-07 ENCOUNTER — Ambulatory Visit
Admission: EM | Admit: 2020-05-07 | Discharge: 2020-05-07 | Disposition: A | Discharge status: Home / Self Care | Payer: BC Managed Care – PPO | Attending: Internal Medicine | Admitting: Internal Medicine

## 2020-05-07 ENCOUNTER — Other Ambulatory Visit: Payer: Self-pay

## 2020-05-07 DIAGNOSIS — S83402A Sprain of unspecified collateral ligament of left knee, initial encounter: Secondary | ICD-10-CM

## 2020-05-07 MED ORDER — IBUPROFEN 600 MG PO TABS: 600.0000 mg | ORAL_TABLET | Freq: Four times a day (QID) | ORAL | 0 refills | Status: DC | PRN

## 2020-05-07 NOTE — Discharge Instructions (Addendum)
Gentle range of motion exercises Icing of the left knee Take NSAIDs as prescribed Apply knee sleeve If you experience any swelling, worsening pain, erythema or your knee giving way please return to the urgent care to be reevaluated.

## 2020-05-07 NOTE — ED Provider Notes (Signed)
[facility]    CSN: [id number] Arrival date & time: 05/07/20  1615      History   Chief Complaint No chief complaint on file.   HPI Sonya Kline is a 50 y.o. female comes to the urgent care with complaints of left knee pain which started a few days ago.  Patient completed a workout including squats as part of his job application.  Following that the patient started experiencing left knee pain.  Patient is able to bear weight.  The pain is of moderate severity when she flexes the knee.  Knee does not give way.  No known relieving factors.  She has not tried any over-the-counter medications.   HPI  Past Medical History:  Diagnosis Date  . Diabetes mellitus, type II ([place])   . Diverticulosis   . HTN (hypertension)     Patient Active Problem List   Diagnosis Date Noted  . Uncontrolled type 2 diabetes mellitus with hyperglycemia ([place]) 10/29/2019  . Essential hypertension, benign 10/29/2019  . Pain of upper abdomen   . Ileus ([place]) 04/23/2018  . Labral tear of hip, degenerative 10/16/2013  . Ankle pain, left 05/03/2011  . DIVERTICULITIS, HX OF 03/09/2009    Past Surgical History:  Procedure Laterality Date  . ABDOMINAL HYSTERECTOMY    . CESAREAN SECTION     x 2  . COLONOSCOPY N/A 08/22/2019   Procedure: COLONOSCOPY;  Surgeon: Daneil Dolin, MD;  [facility];  Service: Endoscopy;  Laterality: N/A;  9:30  . POLYPECTOMY  08/22/2019   Procedure: POLYPECTOMY;  Surgeon: Daneil Dolin, MD;  [facility];  Service: Endoscopy;;    OB History    Gravida  4   Para  2   Term  2   Preterm      AB  2   Living  2     SAB  2   IAB      Ectopic      Multiple      Live Births               Home Medications    Prior to Admission medications   Medication Sig Start Date End Date Taking? Authorizing Provider  ibuprofen (ADVIL) 600 MG tablet Take 1 tablet (600 mg total) by mouth every 6 (six) hours as needed. 05/07/20  Yes ,  Myrene Galas, MD  amLODipine (NORVASC) 10 MG tablet Take 10 mg by mouth daily.     [provider]  BD PEN NEEDLE NANO 2ND GEN 32G X 4 MM MISC SMARTSIG:Pre-Filled Pen Syringe Injection Daily 10/28/19   [provider]  glipiZIDE (GLUCOTROL XL) 5 MG 24 hr tablet Take 1 tablet (5 mg total) by mouth daily with breakfast. 01/29/20   Brita Romp, NP  insulin glargine (LANTUS) 100 UNIT/ML Solostar Pen Inject 40 Units into the skin at bedtime. 02/18/20   Cassandria Anger, MD  rosuvastatin (CRESTOR) 5 MG tablet Take 1 tablet (5 mg total) by mouth daily. 01/29/20   Brita Romp, NP    Family History Family History  Problem Relation Age of Onset  . Diabetes Mother   . Hypertension Mother   . Hypertension Father     Social History Social History   Tobacco Use  . Smoking status: Never Smoker  . Smokeless tobacco: Never Used  Vaping Use  . Vaping Use: Never used  Substance Use Topics  . Alcohol use: No  . Drug use: No  Allergies   Contrast media [iodinated diagnostic agents] and Prednisone   Review of Systems Review of Systems  Musculoskeletal: Positive for arthralgias. Negative for gait problem, joint swelling, myalgias and neck pain.     Physical Exam Triage Vital Signs ED Triage Vitals  Enc Vitals Group     BP 05/07/20 1632 117/78     Pulse Rate 05/07/20 1632 91     Resp 05/07/20 1632 18     Temp 05/07/20 1632 98 F (36.7 C)     Temp src --      SpO2 05/07/20 1632 96 %     Weight --      Height --      Head Circumference --      Peak Flow --      Pain Score 05/07/20 1634 2     Pain Loc --      Pain Edu? --      Excl. in [place]? --    No data found.  Updated Vital Signs BP 117/78   Pulse 91   Temp 98 F (36.7 C)   Resp 18   LMP 11/14/2011   SpO2 96%   Visual Acuity Right Eye Distance:   Left Eye Distance:   Bilateral Distance:    Right Eye Near:   Left Eye Near:    Bilateral Near:     Physical Exam Vitals reviewed.   Constitutional:      General: She is not in acute distress.    Appearance: She is not ill-appearing.  Cardiovascular:     Rate and Rhythm: Normal rate and regular rhythm.  Musculoskeletal:        General: Tenderness present. Normal range of motion.     Comments: Tenderness over the medial surface of the left knee.  No knee swelling.  Anterior and posterior drawer tests are negative.  No erythema.  Neurological:     Mental Status: She is alert.      UC Treatments / Results  Labs (all labs ordered are listed, but only abnormal results are displayed) Labs Reviewed - No data to display  EKG   Radiology No results found.  Procedures Procedures (including critical care time)  Medications Ordered in UC Medications - No data to display  Initial Impression / Assessment and Plan / UC Course  I have reviewed the triage vital signs and the nursing notes.  Pertinent labs & imaging results that were available during my care of the patient were reviewed by me and considered in my medical decision making (see chart for details).     1.  Left knee sprain: Ibuprofen 600 mg every 6 hours as needed for pain Icing of the left knee Gentle range of motion exercises Knee sleeve to help with pain management Return to urgent care if symptoms worsen. Final Clinical Impressions(s) / UC Diagnoses   Final diagnoses:  Sprain of collateral ligament of left knee, initial encounter     Discharge Instructions     Gentle range of motion exercises Icing of the left knee Take NSAIDs as prescribed Apply knee sleeve If you experience any swelling, worsening pain, erythema or your knee giving way please return to the urgent care to be reevaluated.   ED Prescriptions    Medication Sig Dispense Auth. Provider   ibuprofen (ADVIL) 600 MG tablet Take 1 tablet (600 mg total) by mouth every 6 (six) hours as needed. 30 tablet , Myrene Galas, MD     PDMP not reviewed this encounter.  Chase Picket, MD 05/07/20 [phone]

## 2020-05-07 NOTE — ED Triage Notes (Signed)
Unable to bend knee completely.  Painful when trying to bend knee.

## 2020-05-21 ENCOUNTER — Ambulatory Visit: Payer: BC Managed Care – PPO | Admitting: Orthopedic Surgery

## 2020-05-21 ENCOUNTER — Encounter: Payer: Self-pay | Admitting: Orthopedic Surgery

## 2020-05-21 ENCOUNTER — Ambulatory Visit: Payer: BC Managed Care – PPO

## 2020-05-21 ENCOUNTER — Other Ambulatory Visit: Payer: Self-pay

## 2020-05-21 VITALS — BP 137/90 | HR 97 | Ht 62.0 in | Wt 223.5 lb

## 2020-05-21 DIAGNOSIS — M25562 Pain in left knee: Secondary | ICD-10-CM | POA: Diagnosis not present

## 2020-05-21 DIAGNOSIS — Z6841 Body Mass Index (BMI) 40.0 and over, adult: Secondary | ICD-10-CM | POA: Diagnosis not present

## 2020-05-21 NOTE — Patient Instructions (Signed)

## 2020-05-21 NOTE — Progress Notes (Signed)
New Patient Visit  Assessment: Sonya Kline is a 50 y.o. female with the following: Acute pain of left knee; mild arthritis flare vs possible medial meniscus injury  Plan: Recommended a knee injection with aspiration of excess joint fluid Provided a home exercise program Pain could be related to arthritis flare (mild OA on XR, Baker's cyst) or possible medial mensicus injury Ibuprofen as needed Knee sleeve as needed F/u as needed Could consider a repeat injection vs MRI, depending on progression of symptoms.    Procedure note injection - Left Knee joint   Verbal consent was obtained to inject the Left Knee joint  Timeout was completed to confirm the site of injection.  The skin was prepped with alcohol and ethyl chloride was sprayed at the injection site.  An 18-gauge needle was used to aspirate 18 cc of clear joint fluid and then we injected 6 mg of Betamethasone and 1% lidocaine (3 cc) into the Left Knee using a superolateral approach.  There were no complications. A sterile bandage was applied.   The patient meets the AMA guidelines for Morbid obesity with BMI > 40.  The patient has been counseled on weight loss.    Follow-up: Return if symptoms worsen or fail to improve.  Subjective:  Chief Complaint  Patient presents with  . Knee Pain    L/hurting for about 2 to 3 weeks. Went to Urgent Care and was told it was a sprain.     History of Present Illness: Sonya Kline is a 50 y.o. female who presents for evaluation of her left knee pain.  Approximately 3 weeks ago, she was doing some squats as part of a job interview when she notes some pain in her left knee.  Since then, her knee has been swollen.  She presented to an urgent care, and she was given some ibuprofen and a knee sleeve.  These have not improved her pain.  She has had no injections previously.  She has not done any exercises for her left knee.  Prior to this, she had no issues with her left knee.  No previous  injury.   Review of Systems: No fevers or chills No numbness or tingling No chest pain No shortness of breath No bowel or bladder dysfunction No GI distress No headaches   Medical History:  Past Medical History:  Diagnosis Date  . Diabetes mellitus, type II ([place])   . Diverticulosis   . HTN (hypertension)     Past Surgical History:  Procedure Laterality Date  . ABDOMINAL HYSTERECTOMY    . CESAREAN SECTION     x 2  . COLONOSCOPY N/A 08/22/2019   Procedure: COLONOSCOPY;  Surgeon: Daneil Dolin, MD;  [facility];  Service: Endoscopy;  Laterality: N/A;  9:30  . POLYPECTOMY  08/22/2019   Procedure: POLYPECTOMY;  Surgeon: Daneil Dolin, MD;  [facility];  Service: Endoscopy;;    Family History  Problem Relation Age of Onset  . Diabetes Mother   . Hypertension Mother   . Hypertension Father    Social History   Tobacco Use  . Smoking status: Never Smoker  . Smokeless tobacco: Never Used  Vaping Use  . Vaping Use: Never used  Substance Use Topics  . Alcohol use: No  . Drug use: No    Allergies  Allergen Reactions  . Contrast Media [Iodinated Diagnostic Agents] Hives  . Prednisone Rash    No outpatient medications have been marked as taking  for the 05/21/20 encounter (Office Visit) with Mordecai Rasmussen, MD.    Objective: BP 137/90   Pulse 97   Ht 5\' 2"  (1.575 m)   Wt 223 lb 8 oz (101.4 kg)   LMP 11/14/2011   BMI 40.88 kg/m   Physical Exam:  General: Alert and oriented.  No acute distress. Gait: Left-sided antalgic gait.  Evaluation of the left knee demonstrates a moderate effusion.  She has a Baker's cyst in the posterior aspect of her knee, which is tender to palpation.  She is able to achieve full extension, but has pain beyond 100 degrees of flexion.  Tenderness along the medial joint line.  Negative Lachman.  No increased laxity to varus or valgus stress.  Sensation is intact to the left foot.  2+ DP pulse.  IMAGING: I  personally ordered and reviewed the following images   X-rays of the left knee were obtained in clinic today and demonstrates neutral overall alignment.  She has some mild medial joint space narrowing, compared to the lateral compartment.  No acute injuries are noted.  Small osteophytes present on the medial femoral condyle.  Impression: Mild left knee arthritis, primarily within the medial compartment.   New Medications:  No orders of the defined types were placed in this encounter.     Mordecai Rasmussen, MD  05/21/2020 10:01 AM

## 2020-05-31 ENCOUNTER — Ambulatory Visit (INDEPENDENT_AMBULATORY_CARE_PROVIDER_SITE_OTHER): Payer: BC Managed Care – PPO | Admitting: Nurse Practitioner

## 2020-05-31 ENCOUNTER — Other Ambulatory Visit: Payer: Self-pay

## 2020-05-31 ENCOUNTER — Encounter: Payer: Self-pay | Admitting: Nurse Practitioner

## 2020-05-31 VITALS — BP 147/91 | HR 96 | Ht 62.0 in | Wt 223.0 lb

## 2020-05-31 DIAGNOSIS — E1165 Type 2 diabetes mellitus with hyperglycemia: Secondary | ICD-10-CM

## 2020-05-31 DIAGNOSIS — I10 Essential (primary) hypertension: Secondary | ICD-10-CM

## 2020-05-31 LAB — POCT GLYCOSYLATED HEMOGLOBIN (HGB A1C): HbA1c, POC (controlled diabetic range): 7.9 % — AB (ref 0.0–7.0)

## 2020-05-31 MED ORDER — INSULIN GLARGINE 100 UNIT/ML SOLOSTAR PEN: 40.0000 [IU] | PEN_INJECTOR | Freq: Every day | SUBCUTANEOUS | 0 refills | Status: DC

## 2020-05-31 MED ORDER — GLIPIZIDE ER 5 MG PO TB24: 5.0000 mg | ORAL_TABLET | Freq: Every day | ORAL | 1 refills | Status: DC

## 2020-05-31 NOTE — Patient Instructions (Signed)

## 2020-05-31 NOTE — Progress Notes (Signed)
05/31/2020, 1:29 PM  Endocrinology follow-up note   Subjective:    Patient ID: Sonya Kline, female    DOB: 24-Feb-1970.  [name] is being seen in follow-up after she was seen in consultation for management of currently uncontrolled symptomatic diabetes requested by  Asencion Noble, MD.   Past Medical History:  Diagnosis Date  . Diabetes mellitus, type II ([place])   . Diverticulosis   . HTN (hypertension)     Past Surgical History:  Procedure Laterality Date  . ABDOMINAL HYSTERECTOMY    . CESAREAN SECTION     x 2  . COLONOSCOPY N/A 08/22/2019   Procedure: COLONOSCOPY;  Surgeon: Daneil Dolin, MD;  [facility];  Service: Endoscopy;  Laterality: N/A;  9:30  . POLYPECTOMY  08/22/2019   Procedure: POLYPECTOMY;  Surgeon: Daneil Dolin, MD;  [facility];  Service: Endoscopy;;    Social History   Socioeconomic History  . Marital status: Married    Spouse name: Not on file  . Number of children: Not on file  . Years of education: college  . Highest education level: Not on file  Occupational History  . Not on file  Tobacco Use  . Smoking status: Never Smoker  . Smokeless tobacco: Never Used  Vaping Use  . Vaping Use: Never used  Substance and Sexual Activity  . Alcohol use: No  . Drug use: No  . Sexual activity: Yes    Birth control/protection: None, Surgical  Other Topics Concern  . Not on file  Social History Narrative  . Not on file   Social Determinants of Health   Financial Resource Strain: Not on file  Food Insecurity: Not on file  Transportation Needs: Not on file  Physical Activity: Not on file  Stress: Not on file  Social Connections: Not on file    Family History  Problem Relation Age of Onset  . Diabetes Mother   . Hypertension Mother   . Hypertension Father     Outpatient Encounter Medications as of 05/31/2020  Medication Sig  . amLODipine  (NORVASC) 10 MG tablet Take 10 mg by mouth daily.   . BD PEN NEEDLE NANO 2ND GEN 32G X 4 MM MISC SMARTSIG:Pre-Filled Pen Syringe Injection Daily  . ibuprofen (ADVIL) 600 MG tablet Take 1 tablet (600 mg total) by mouth every 6 (six) hours as needed.  . rosuvastatin (CRESTOR) 5 MG tablet Take 1 tablet (5 mg total) by mouth daily.  . [DISCONTINUED] glipiZIDE (GLUCOTROL XL) 5 MG 24 hr tablet Take 1 tablet (5 mg total) by mouth daily with breakfast.  . [DISCONTINUED] insulin glargine (LANTUS) 100 UNIT/ML Solostar Pen Inject 40 Units into the skin at bedtime.  [name] glipiZIDE (GLUCOTROL XL) 5 MG 24 hr tablet Take 1 tablet (5 mg total) by mouth daily with breakfast.  . insulin glargine (LANTUS) 100 UNIT/ML Solostar Pen Inject 40 Units into the skin at bedtime.   No facility-administered encounter medications on file as of 05/31/2020.    ALLERGIES: Allergies  Allergen Reactions  . Contrast Media [Iodinated Diagnostic Agents] Hives  . Prednisone Rash    VACCINATION  STATUS: Immunization History  Administered Date(s) Administered  . Rabies, IM 03/17/2013, 03/20/2013, 03/27/2013, 04/11/2013  . Tdap 03/17/2013    Diabetes She presents for her follow-up diabetic visit. She has type 2 diabetes mellitus. Onset time: She was diagnosed at approximate age of 50 years. Her disease course has been improving. There are no hypoglycemic associated symptoms. Pertinent negatives for hypoglycemia include no confusion, headaches, pallor or seizures. Pertinent negatives for diabetes include no chest pain, no fatigue, no polydipsia, no polyphagia and no polyuria. There are no hypoglycemic complications. Symptoms are improving. There are no diabetic complications. Risk factors for coronary artery disease include diabetes mellitus, hypertension, obesity and dyslipidemia. Current diabetic treatment includes insulin injections and oral agent (monotherapy). She is compliant with treatment most of the time. Her weight is  fluctuating minimally. She is following a generally unhealthy diet. When asked about meal planning, she reported none. She has not had a previous visit with a dietitian. She rarely participates in exercise. Her home blood glucose trend is decreasing steadily. Her breakfast blood glucose range is generally 140-180 mg/dl. Her lunch blood glucose range is generally 110-130 mg/dl. Her dinner blood glucose range is generally 110-130 mg/dl. (She presents today with her logs, no meter, showing stable glycemic profile with slightly above target fasting readings.  Her POCT A1c today is 7.9%, improving from previous visit of 8%.  She denies any hypoglycemia.) An ACE inhibitor/angiotensin II receptor blocker is not being taken. She does not see a podiatrist.Eye exam is current.  Hypertension This is a chronic problem. The current episode started more than 1 year ago. The problem has been gradually worsening since onset. The problem is uncontrolled. Pertinent negatives include no chest pain, headaches, palpitations or shortness of breath. There are no associated agents to hypertension. Risk factors for coronary artery disease include diabetes mellitus, family history, obesity, sedentary lifestyle and dyslipidemia. Past treatments include calcium channel blockers. The current treatment provides mild improvement. Compliance problems include diet and exercise.   Hyperlipidemia This is a chronic problem. The current episode started more than 1 year ago. The problem is uncontrolled. Recent lipid tests were reviewed and are high. Exacerbating diseases include diabetes. There are no known factors aggravating her hyperlipidemia. Pertinent negatives include no chest pain or shortness of breath. Current antihyperlipidemic treatment includes statins. The current treatment provides mild improvement of lipids. Compliance problems include adherence to diet and adherence to exercise.  Risk factors for coronary artery disease include  diabetes mellitus, dyslipidemia, hypertension, obesity and a sedentary lifestyle.    Review of systems  Constitutional: + stable body weight,  current Body mass index is 40.79 kg/m. , no fatigue, no subjective hyperthermia, no subjective hypothermia Eyes: no blurry vision, no xerophthalmia ENT: no sore throat, no nodules palpated in throat, no dysphagia/odynophagia, no hoarseness Cardiovascular: no chest pain, no shortness of breath, no palpitations, no leg swelling Respiratory: no cough, no shortness of breath Gastrointestinal: no nausea/vomiting/diarrhea Musculoskeletal: no muscle/joint aches Skin: no rashes, no hyperemia Neurological: no tremors, no numbness, no tingling, no dizziness Psychiatric: no depression, no anxiety   Objective:    BP (!) 147/91 (BP Location: Left Arm)   Pulse 96   Ht 5\' 2"  (1.575 m)   Wt 223 lb (101.2 kg)   LMP 11/14/2011   BMI 40.79 kg/m   Wt Readings from Last 3 Encounters:  05/31/20 223 lb (101.2 kg)  05/21/20 223 lb 8 oz (101.4 kg)  01/29/20 207 lb (93.9 kg)    BP Readings from Last  3 Encounters:  05/31/20 (!) 147/91  05/21/20 137/90  05/07/20 117/78      Physical Exam- Limited  Constitutional:  Body mass index is 40.79 kg/m. , not in acute distress, normal state of mind Eyes:  EOMI, no exophthalmos Neck: Supple Cardiovascular: RRR, no murmers, rubs, or gallops, no edema Respiratory: Adequate breathing efforts, no crackles, rales, rhonchi, or wheezing Musculoskeletal: no gross deformities, strength intact in all four extremities, no gross restriction of joint movements Skin:  no rashes, no hyperemia Neurological: no tremor with outstretched hands   Foot exam:   No rashes, ulcers, cuts, calluses, onychodystrophy.   Good pulses bilat.  Good sensation to 10 g monofilament bilat.   POCT ABI Results 05/31/20   Right ABI:  1.15      Left ABI:  1.12  Right leg systolic / diastolic: 619/50 mmHg Left leg systolic / diastolic:  932/67 mmHg  Arm systolic / diastolic: 124/58 mmHG  Detailed report will be scanned into patient chart.   CMP ( most recent) CMP     Component Value Date/Time   NA 144 01/21/2020 0737   K 4.7 01/21/2020 0737   CL 103 01/21/2020 0737   CO2 26 01/21/2020 0737   GLUCOSE 99 01/21/2020 0737   GLUCOSE 110 (H) 04/25/2018 0433   BUN 16 01/21/2020 0737   CREATININE 0.78 01/21/2020 0737   CALCIUM 9.2 01/21/2020 0737   PROT 7.5 01/21/2020 0737   ALBUMIN 3.9 01/21/2020 0737   AST 16 01/21/2020 0737   ALT 11 01/21/2020 0737   ALKPHOS 99 01/21/2020 0737   BILITOT 0.3 01/21/2020 0737   GFRNONAA 88 01/21/2020 0737   GFRAA 102 01/21/2020 0737     Diabetic Labs (most recent): Lab Results  Component Value Date   HGBA1C 7.9 (A) 05/31/2020   HGBA1C 8.0 (A) 01/29/2020   HGBA1C >15.5 10/08/2019        Assessment & Plan:   1) Uncontrolled type 2 diabetes mellitus with hyperglycemia ([place])  - Sonya Kline has currently uncontrolled symptomatic type 2 DM since 50 years of age.  Recent labs reviewed.   She presents today with her logs, no meter, showing stable glycemic profile with slightly above target fasting readings.  Her POCT A1c today is 7.9%, improving from previous visit of 8%.  She denies any hypoglycemia.  - I had a long discussion with her about the progressive nature of diabetes and the pathology behind its complications. -She does not report gross complications of diabetes, however she remains at a high risk for more acute and chronic complications which include CAD, CVA, CKD, retinopathy, and neuropathy. These are all discussed in detail with her.  - Nutritional counseling repeated at each appointment due to patients tendency to fall back in to old habits.  - The patient admits there is a room for improvement in their diet and drink choices. -  Suggestion is made for the patient to avoid simple carbohydrates from their diet including Cakes, Sweet Desserts / Pastries, Ice  Cream, Soda (diet and regular), Sweet Tea, Candies, Chips, Cookies, Sweet Pastries,  Store Bought Juices, Alcohol in Excess of  1-2 drinks a day, Artificial Sweeteners, Coffee Creamer, and "Sugar-free" Products. This will help patient to have stable blood glucose profile and potentially avoid unintended weight gain.   - I encouraged the patient to switch to  unprocessed or minimally processed complex starch and increased protein intake (animal or plant source), fruits, and vegetables.   - Patient is advised to stick to  a routine mealtimes to eat 3 meals  a day and avoid unnecessary snacks ( to snack only to correct hypoglycemia).  - she is following with Jearld Fenton, RDN, CDE for diabetes education.  - I have approached her with the following individualized plan to manage  her diabetes and patient agrees:   -In light of her prevailing glycemic burden, she will continue to need insulin treatment in order for her to achieve and maintain control of diabetes to target.   -She is tolerating her current dose of Lantus well.  She is advised to continue Lantus 40 units SQ daily at bedtime and Glipizide 5 mg XL PO daily.    -She will be considered for bolus insulin if she continues to have significant postprandial hyperglycemia.  - she is encouraged to continue monitoring blood glucose twice daily, before breakfast and before bed, and to call the clinic if she has readings less than 70 or greater than 300 for 3 tests in a row.  - she will be considered for incretin therapy as appropriate next visit.  - Specific targets for  A1c;  LDL, HDL,  and Triglycerides were discussed with the patient.  2) Blood Pressure /Hypertension:  Her blood pressure is not controlled to target.  She is advised to continue Norvasc 10 mg po daily.  She says her BP is only high when she goes to the doctor.  3) Lipids/Hyperlipidemia:  Her most recent lipid panel from 01/21/20 shows uncontrolled LDL at 169.  I discussed and  initiated low dose Crestor 5 mg po daily at bedtime at last visit.  She is advised to continue.  Side effects and precautions discussed with her.  4)  Weight/Diet:  Her Body mass index is 40.79 kg/m.  -   clearly complicating her diabetes care.   she is a candidate for weight loss. I discussed with her the fact that loss of 5 - 10% of her  current body weight will have the most impact on her diabetes management.  Exercise, and detailed carbohydrates information provided  -  detailed on discharge instructions.  5) Chronic Care/Health Maintenance: -she is not on ACEI/ARB is on Statin medications and  is encouraged to initiate and continue to follow up with Ophthalmology, Dentist,  Podiatrist at least yearly or according to recommendations, and advised to   stay away from smoking. I have recommended yearly flu vaccine and pneumonia vaccine at least every 5 years; moderate intensity exercise for up to 150 minutes weekly; and  sleep for at least 7 hours a day.  - she is advised to maintain close follow up with Asencion Noble, MD for primary care needs, as well as her other providers for optimal and coordinated care.    I spent 32 minutes in the care of the patient today including review of labs from [facility], Lipids, Thyroid Function, Hematology (current and previous including abstractions from other facilities); face-to-face time discussing  her blood glucose readings/logs, discussing hypoglycemia and hyperglycemia episodes and symptoms, medications doses, her options of short and long term treatment based on the latest standards of care / guidelines;  discussion about incorporating lifestyle medicine;  and documenting the encounter.    Please refer to Patient Instructions for Blood Glucose Monitoring and Insulin/Medications Dosing Guide"  in media tab for additional information. Please  also refer to " Patient Self Inventory" in the Media  tab for reviewed elements of pertinent patient history.  [name]  participated in the discussions, expressed understanding, and  voiced agreement with the above plans.  All questions were answered to her satisfaction. she is encouraged to contact clinic should she have any questions or concerns prior to her return visit.    Follow up plan: - Return in about 4 months (around 09/30/2020) for Diabetes follow up with A1c in office, No previsit labs, Bring glucometer and logs.  Rayetta Pigg, [facility] [address] [place], [place] [address] Phone: [phone] Fax: [phone]  05/31/2020, 1:29 PM

## 2020-06-18 ENCOUNTER — Encounter: Payer: Self-pay | Admitting: Orthopedic Surgery

## 2020-06-18 ENCOUNTER — Ambulatory Visit (INDEPENDENT_AMBULATORY_CARE_PROVIDER_SITE_OTHER): Payer: BC Managed Care – PPO | Admitting: Orthopedic Surgery

## 2020-06-18 ENCOUNTER — Other Ambulatory Visit: Payer: Self-pay

## 2020-06-18 VITALS — BP 145/85 | HR 104 | Ht 62.0 in | Wt 223.0 lb

## 2020-06-18 DIAGNOSIS — M25562 Pain in left knee: Secondary | ICD-10-CM

## 2020-06-18 DIAGNOSIS — G8929 Other chronic pain: Secondary | ICD-10-CM

## 2020-06-18 NOTE — Progress Notes (Signed)
Orthopaedic Clinic Return  Assessment: Sonya Kline is a 50 y.o. female with the following: Left knee pain, specifically within the medial compartment.  Plan: Patient continues to have pain in the left knee, especially within the medial compartment.  At the last visit, we injected the knee, but this only provided approximately 1-2 weeks of relief of her symptoms.  Her pain has returned to her previous levels.  After discussing options, including continuing with physical therapy and activities as tolerated with over-the-counter medications as needed versus obtaining an MRI for further evaluation, the patient would like to proceed with an MRI.  We discussed the possibility of the MRI would not show any intra-articular pathology that could be addressed with surgery, at which time we would have to continue with nonoperative measures such as formal physical therapy or considering other types of injections.  She stated her understanding.  We will see her approximately 1 week after the left knee MRI.  Follow-up: Return for After MRI.   Subjective:  Chief Complaint  Patient presents with  . Knee Pain    Lt knee pain, says pain only subsided for a wk after injection. Pain is back to how it was before.     History of Present Illness: Sonya Kline is a 50 y.o. female who returns to clinic today for repeat evaluation of her left knee.  She was seen in clinic approximately 1 month ago, at which time we provided her with a left knee steroid injection.  She had initially injured her knee doing some squats as part of testing for a new job.  The injection helped for approximately 1-2 weeks, but then returned to level similar to prior to the injection.  She continues to have difficulty ambulating.  She tried some home exercises, with limited improvement in her symptoms.  The pain is primarily located within the medial aspect of the knee.  No new injuries are appreciated.  She does think that there is some  swelling in the left knee.   Review of Systems: No fevers or chills No numbness or tingling No chest pain No shortness of breath No bowel or bladder dysfunction No GI distress No headaches    Objective: BP (!) 145/85   Pulse (!) 104   Ht 5\' 2"  (1.575 m)   Wt 223 lb (101.2 kg)   LMP 11/14/2011   BMI 40.79 kg/m   Physical Exam:  Alert and oriented.  No acute distress.  Obese female.  Evaluation of left knee demonstrates a mild effusion.  She has good range of motion, and is able to get to full extension, flexion beyond 90 degrees.  Negative Lachman.  No increased laxity to varus or valgus stress.  She does have tenderness to palpation along the medial joint line.  Toes are warm and well-perfused.  Sensation is intact distally in the dorsal and plantar aspect of the foot.  IMAGING: I personally ordered and reviewed the following images:   No new imaging obtained today.   Mordecai Rasmussen, MD 06/18/2020 2:52 PM

## 2020-07-07 ENCOUNTER — Ambulatory Visit (HOSPITAL_COMMUNITY)
Admission: RE | Admit: 2020-07-07 | Discharge: 2020-07-07 | Disposition: A | Discharge status: Home / Self Care | Payer: BC Managed Care – PPO | Source: Ambulatory Visit | Attending: Orthopedic Surgery | Admitting: Orthopedic Surgery

## 2020-07-07 ENCOUNTER — Other Ambulatory Visit: Payer: Self-pay

## 2020-07-07 DIAGNOSIS — G8929 Other chronic pain: Secondary | ICD-10-CM | POA: Insufficient documentation

## 2020-07-07 DIAGNOSIS — M25562 Pain in left knee: Secondary | ICD-10-CM | POA: Insufficient documentation

## 2020-07-09 ENCOUNTER — Ambulatory Visit: Payer: BC Managed Care – PPO | Admitting: Orthopedic Surgery

## 2020-07-13 ENCOUNTER — Ambulatory Visit: Payer: BC Managed Care – PPO | Admitting: Orthopedic Surgery

## 2020-07-13 ENCOUNTER — Encounter: Payer: Self-pay | Admitting: Orthopedic Surgery

## 2020-07-13 ENCOUNTER — Other Ambulatory Visit: Payer: Self-pay

## 2020-07-13 VITALS — BP 138/93 | HR 100 | Ht 62.0 in | Wt 221.0 lb

## 2020-07-13 DIAGNOSIS — M25562 Pain in left knee: Secondary | ICD-10-CM

## 2020-07-13 DIAGNOSIS — G8929 Other chronic pain: Secondary | ICD-10-CM

## 2020-07-13 NOTE — Progress Notes (Signed)
Orthopaedic Clinic Return  Assessment: Sonya Kline is a 50 y.o. female with the following: Left knee pain; no acute findings on MRI  Plan: Reviewed MRI findings; negative for acute pathology Recommend continued exercise, walking, home exercise program Ice knee as needed Ibuprofen and Voltaren gel Discussed formal PT; if she is interested, we can place a referral Follow up as needed  Body mass index is 40.42 kg/m.  Follow-up: Return if symptoms worsen or fail to improve.   Subjective:  Chief Complaint  Patient presents with  . Knee Pain    Lt knee pain and swelling     History of Present Illness: Sonya Kline is a 50 y.o. female who returns for evaluation of left knee pain.  Pain is anterolateral.  No mechanical symptoms.  Pain did not improve with an injection.  She has been completing a home exercise program.  She has reduced flexion in her knee.  No swelling.  Occasional ibuprofen.  Here to discuss results of MRI.    Review of Systems: No fevers or chills No numbness or tingling No chest pain No shortness of breath No bowel or bladder dysfunction No GI distress No headaches    Objective: BP (!) 138/93   Pulse 100   Ht 5\' 2"  (1.575 m)   Wt 221 lb (100.2 kg)   LMP 11/14/2011   BMI 40.42 kg/m   Physical Exam:  Left sided antalgic gait Tenderness anterolateral knee.  Tenderness along lateral joint line.  Negative Lachman. ROM from 0-110.  No obvious atrophy of the quadriceps.   IMAGING: I personally ordered and reviewed the following images:  MRI of the left knee without bony edema, cartilage injuries or meniscal pathology.   Mordecai Rasmussen, MD 07/13/2020 9:13 AM

## 2020-07-14 ENCOUNTER — Other Ambulatory Visit: Payer: Self-pay | Admitting: Orthopedic Surgery

## 2020-07-22 ENCOUNTER — Telehealth: Payer: Self-pay | Admitting: Orthopedic Surgery

## 2020-07-22 NOTE — Telephone Encounter (Signed)
Call received from patient regarding her left knee, for which she has most recently treated with Dr Amedeo Kinsman. Patient states that she would like to see Dr Aline Brochure to have his opinion. States the knee feels the same; said she does not think that physical therapy will help.

## 2020-07-23 NOTE — Telephone Encounter (Signed)
That was pulled back to clinic staff in error (system upgrade 07/22/20; system locked during my attempting to close note) Patient aware pending Dr Harrison's response.

## 2020-07-28 ENCOUNTER — Telehealth: Payer: Self-pay | Admitting: Radiology

## 2020-07-28 NOTE — Telephone Encounter (Signed)
Dr Aline Brochure per original note 07/22/20, (Call received from patient regarding her left knee, for which she has most recently treated with Dr Amedeo Kinsman. Patient states that she would like to see Dr Aline Brochure to have his opinion. States the knee feels the same; said she does not think that physical therapy will help)  Please advise.

## 2020-07-28 NOTE — Telephone Encounter (Signed)
-----   Message from Uvaldo Bristle sent at 07/28/2020 12:06 PM EDT ----- Regarding: Just to check The original phone note "got frozen" on 07/22/20 when I was entering it for [place], [place] [[id number]] and I saw that it got pulled back, etc.  Patient has called twice to check on status. Just wanted to check.  Thank you

## 2020-07-28 NOTE — Telephone Encounter (Signed)
Its in your box, waiting for you to send to Dr Aline Brochure

## 2020-07-29 NOTE — Telephone Encounter (Signed)
Called back to patient, relayed, and scheduled accordingly.

## 2020-08-04 ENCOUNTER — Other Ambulatory Visit: Payer: Self-pay

## 2020-08-04 ENCOUNTER — Encounter: Payer: Self-pay | Admitting: Orthopedic Surgery

## 2020-08-04 ENCOUNTER — Ambulatory Visit: Payer: BC Managed Care – PPO | Admitting: Orthopedic Surgery

## 2020-08-04 VITALS — BP 132/77 | HR 108 | Ht 62.0 in | Wt 226.0 lb

## 2020-08-04 DIAGNOSIS — Z6841 Body Mass Index (BMI) 40.0 and over, adult: Secondary | ICD-10-CM

## 2020-08-04 DIAGNOSIS — M25462 Effusion, left knee: Secondary | ICD-10-CM

## 2020-08-04 MED ORDER — MELOXICAM 7.5 MG PO TABS: 7.5000 mg | ORAL_TABLET | Freq: Every day | ORAL | 5 refills | Status: DC

## 2020-08-04 NOTE — Patient Instructions (Signed)
Ice the knee 30 minutes 4 x a day   Stop ibuprofen   Start meloxicam 7.5 mg daily   YOU HAVE ARTHRITIS AND THE KNEE IS SWELLING FROM THAT; UNFORTUNATELY SURGERY CAN NOT FIX THIS LEVEL OF ARTHRITIS, YOUR ALLERGY TO PREDNISONE (BEST MEDICATION FOR THIS) IS MAKING THIS HARDER TO TREAT   DO NOT DO ANY EXCESSIVE WALKING KNEELING OR SQUATTING   REST AND ICE IS THE BEST TREATMENT FOR THIS PROBLEM

## 2020-08-04 NOTE — Progress Notes (Addendum)
Chief Complaint  Patient presents with   Knee Pain    Left knee   Body mass index is 41.34 kg/m.  Encounter Diagnoses  Name Primary?   Effusion of knee joint, left Yes   Body mass index 40.0-44.9, adult (HCC)    Morbid obesity ([place])    Meds ordered this encounter  Medications   meloxicam (MOBIC) 7.5 MG tablet    Sig: Take 1 tablet (7.5 mg total) by mouth daily.    Dispense:  30 tablet    Refill:  5     The patient meets the AMA guidelines for Morbid (severe) obesity with a BMI > 40.0 and I have recommended weight loss.  50 year old female previously seen in our clinic with knee pain and swelling had an injection and ibuprofen and home exercises did not improve eventually had MRI which showed mild arthritis of the joint comes in complaining of medial and anterior knee pain and frustrated that she is not able to get any relief or improve  Her exam is notable for left knee joint effusion medial joint line pain irritable patella the knee does come to full extension she has some pain and stiffness with flexion there is no instability  I looked at her x-ray which was normal  Her MRI showed some mild patellofemoral and medial compartment disease with no meniscal tears and no ligament injury  Procedure note injection and aspiration left knee joint  Verbal consent was obtained to aspirate and inject the left knee joint   Timeout was completed to confirm the site of aspiration and injection  An 18-gauge needle was used to aspirate the left knee joint from a suprapatellar lateral approach.  The medications used were 40 mg of Depo-Medrol and 1% lidocaine 3 cc  Anesthesia was provided by ethyl chloride and the skin was prepped with alcohol.  After cleaning the skin with alcohol an 18-gauge needle was used to aspirate the right knee joint.  We obtained 30 cc of fluid clr  We followed this by injection of 40 mg of Depo-Medrol and 3 cc 1% lidocaine.  There were no complications. A  sterile bandage was applied.

## 2020-08-11 ENCOUNTER — Ambulatory Visit: Payer: BC Managed Care – PPO | Admitting: Orthopedic Surgery

## 2020-08-19 ENCOUNTER — Other Ambulatory Visit: Payer: Self-pay

## 2020-08-19 ENCOUNTER — Encounter: Payer: Self-pay | Admitting: Orthopedic Surgery

## 2020-08-19 ENCOUNTER — Ambulatory Visit (INDEPENDENT_AMBULATORY_CARE_PROVIDER_SITE_OTHER): Payer: BC Managed Care – PPO | Admitting: Orthopedic Surgery

## 2020-08-19 VITALS — BP 117/83 | HR 92 | Ht 62.0 in | Wt 221.0 lb

## 2020-08-19 DIAGNOSIS — M1710 Unilateral primary osteoarthritis, unspecified knee: Secondary | ICD-10-CM

## 2020-08-19 DIAGNOSIS — M1712 Unilateral primary osteoarthritis, left knee: Secondary | ICD-10-CM | POA: Diagnosis not present

## 2020-08-19 DIAGNOSIS — Z6841 Body Mass Index (BMI) 40.0 and over, adult: Secondary | ICD-10-CM | POA: Diagnosis not present

## 2020-08-19 DIAGNOSIS — M25462 Effusion, left knee: Secondary | ICD-10-CM

## 2020-08-19 NOTE — Progress Notes (Signed)
FOLLOW UP   Encounter Diagnoses  Name Primary?   Effusion of knee joint, left Yes   Body mass index 40.0-44.9, adult ([place])    Morbid obesity ([place])    Primary localized osteoarthritis of knee left      Chief Complaint  Patient presents with   Knee Pain    Lt knee pain better after fluid drained.      50 year old female with left knee effusion which was drained, her x-ray looks fairly normal she did have an MRI which showed patellofemoral and medial compartment disease with no meniscal tears  She received 40 mg of Depo-Medrol injection and presents for follow-up   Range of motion has returned to normal there is no effusion she is walking without any support  Follow-up as needed

## 2020-08-26 ENCOUNTER — Telehealth: Payer: Self-pay | Admitting: Internal Medicine

## 2020-08-26 NOTE — Telephone Encounter (Signed)
Spoke to pt. She stated that she started having blood in her stool last week 08/22/2020. She has had no n/v. No fever or constipation or diarrhea. She states she only see blood when she has a bowel movement. She has not taking anything for this. She says bowel movements are normal for her she goes every other day. She had a colonoscopy 08/22/2019 and she has a upcoming ov 10/13/2020.  She states no pain or discomfort.

## 2020-08-26 NOTE — Telephone Encounter (Signed)
Lmom for pt. To call office back. 

## 2020-08-26 NOTE — Telephone Encounter (Signed)
Spoke to pt. Informed her that we did not have anything sooner than her appt. Coming up in August. Informed her that she would be put on the cancellation list.

## 2020-08-26 NOTE — Telephone Encounter (Signed)
[phone]  patient called and said she is having some blood in her stool.  She has an upcoming appointment in august.  Please call her, wanted to know if there was anything she could do

## 2020-09-04 ENCOUNTER — Other Ambulatory Visit: Payer: Self-pay | Admitting: Nurse Practitioner

## 2020-09-04 DIAGNOSIS — E1165 Type 2 diabetes mellitus with hyperglycemia: Secondary | ICD-10-CM

## 2020-09-21 ENCOUNTER — Ambulatory Visit
Admission: EM | Admit: 2020-09-21 | Discharge: 2020-09-21 | Disposition: A | Discharge status: Home / Self Care | Payer: BC Managed Care – PPO | Attending: Emergency Medicine | Admitting: Emergency Medicine

## 2020-09-21 ENCOUNTER — Encounter: Payer: Self-pay | Admitting: Emergency Medicine

## 2020-09-21 DIAGNOSIS — J020 Streptococcal pharyngitis: Secondary | ICD-10-CM | POA: Diagnosis not present

## 2020-09-21 LAB — POCT RAPID STREP A (OFFICE): Rapid Strep A Screen: POSITIVE — AB

## 2020-09-21 MED ORDER — AMOXICILLIN 500 MG PO CAPS: 500.0000 mg | ORAL_CAPSULE | Freq: Two times a day (BID) | ORAL | 0 refills | Status: AC

## 2020-09-21 NOTE — ED Provider Notes (Signed)
[facility]    CSN: [id number] Arrival date & time: 09/21/20  1737      History   Chief Complaint Chief Complaint  Patient presents with   Otalgia    HPI Sonya Kline is a 50 y.o. female history of hypertension, DM type II, presenting today for evaluation of sore throat and ear pain.  Symptoms began this morning.  Denies known fevers.  Ear pain is mainly on left side.  Denies cough or congestion.  HPI  Past Medical History:  Diagnosis Date   Diabetes mellitus, type II ([place])    Diverticulosis    HTN (hypertension)     Patient Active Problem List   Diagnosis Date Noted   Uncontrolled type 2 diabetes mellitus with hyperglycemia ([place]) 10/29/2019   Essential hypertension, benign 10/29/2019   Pain of upper abdomen    Ileus ([place]) 04/23/2018   Labral tear of hip, degenerative 10/16/2013   Ankle pain, left 05/03/2011   DIVERTICULITIS, HX OF 03/09/2009    Past Surgical History:  Procedure Laterality Date   ABDOMINAL HYSTERECTOMY     CESAREAN SECTION     x 2   COLONOSCOPY N/A 08/22/2019   Procedure: COLONOSCOPY;  Surgeon: Daneil Dolin, MD;  [facility];  Service: Endoscopy;  Laterality: N/A;  9:30   POLYPECTOMY  08/22/2019   Procedure: POLYPECTOMY;  Surgeon: Daneil Dolin, MD;  [facility];  Service: Endoscopy;;    OB History     Gravida  4   Para  2   Term  2   Preterm      AB  2   Living  2      SAB  2   IAB      Ectopic      Multiple      Live Births               Home Medications    Prior to Admission medications   Medication Sig Start Date End Date Taking? Authorizing Provider  amoxicillin (AMOXIL) 500 MG capsule Take 1 capsule (500 mg total) by mouth 2 (two) times daily for 10 days. 09/21/20 10/01/20 Yes ,  C, PA-C  amLODipine (NORVASC) 10 MG tablet Take 10 mg by mouth daily.     [provider]  BD PEN NEEDLE NANO 2ND GEN 32G X 4 MM MISC SMARTSIG:Pre-Filled Pen Syringe Injection  Daily 10/28/19   [provider]  glipiZIDE (GLUCOTROL XL) 5 MG 24 hr tablet Take 1 tablet (5 mg total) by mouth daily with breakfast. 05/31/20   Brita Romp, NP  ibuprofen (ADVIL) 600 MG tablet TAKE 1 TABLET(600 MG) BY MOUTH EVERY 6 HOURS AS NEEDED 07/15/20   Mordecai Rasmussen, MD  LANTUS SOLOSTAR 100 UNIT/ML Solostar Pen INJECT 40 UNITS UNDER THE SKIN AT BEDTIME 09/06/20   Brita Romp, NP  meloxicam (MOBIC) 7.5 MG tablet Take 1 tablet (7.5 mg total) by mouth daily. 08/04/20   Carole Civil, MD  rosuvastatin (CRESTOR) 5 MG tablet Take 1 tablet (5 mg total) by mouth daily. 01/29/20   Brita Romp, NP    Family History Family History  Problem Relation Age of Onset   Diabetes Mother    Hypertension Mother    Hypertension Father     Social History Social History   Tobacco Use   Smoking status: Never   Smokeless tobacco: Never  Vaping Use   Vaping Use: Never used  Substance Use Topics  Alcohol use: No   Drug use: No     Allergies   Contrast media [iodinated diagnostic agents] and Prednisone   Review of Systems Review of Systems  Constitutional:  Negative for activity change, appetite change, chills, fatigue and fever.  HENT:  Positive for congestion, ear pain, sinus pressure and sore throat. Negative for rhinorrhea and trouble swallowing.   Eyes:  Negative for discharge and redness.  Respiratory:  Negative for cough, chest tightness and shortness of breath.   Cardiovascular:  Negative for chest pain.  Gastrointestinal:  Negative for abdominal pain, diarrhea, nausea and vomiting.  Musculoskeletal:  Negative for myalgias.  Skin:  Negative for rash.  Neurological:  Negative for dizziness, light-headedness and headaches.    Physical Exam Triage Vital Signs ED Triage Vitals  Enc Vitals Group     BP      Pulse      Resp      Temp      Temp src      SpO2      Weight      Height      Head Circumference      Peak Flow      Pain Score       Pain Loc      Pain Edu?      Excl. in [place]?    No data found.  Updated Vital Signs BP 125/85 (BP Location: Right Arm)   Pulse (!) 127   Temp (!) 100.8 F (38.2 C) (Oral)   Resp 19   LMP 11/14/2011   SpO2 93%   Visual Acuity Right Eye Distance:   Left Eye Distance:   Bilateral Distance:    Right Eye Near:   Left Eye Near:    Bilateral Near:     Physical Exam Vitals and nursing note reviewed.  Constitutional:      Appearance: She is well-developed.     Comments: No acute distress  HENT:     Head: Normocephalic and atraumatic.     Ears:     Comments: Bilateral ears without tenderness to palpation of external auricle, tragus and mastoid, EAC's without erythema or swelling, TM's with good bony landmarks and cone of light. Non erythematous.      Nose: Nose normal.     Mouth/Throat:     Comments: Oral mucosa pink and moist, bilateral tonsils erythematous and with exudate diffusely, no uvula deviation or swelling. Normal phonation.  Eyes:     Conjunctiva/sclera: Conjunctivae normal.  Cardiovascular:     Rate and Rhythm: Normal rate and regular rhythm.  Pulmonary:     Effort: Pulmonary effort is normal. No respiratory distress.     Comments: Breathing comfortably at rest, CTABL, no wheezing, rales or other adventitious sounds auscultated  Abdominal:     General: There is no distension.  Musculoskeletal:        General: Normal range of motion.     Cervical back: Neck supple.  Skin:    General: Skin is warm and dry.  Neurological:     Mental Status: She is alert and oriented to person, place, and time.     UC Treatments / Results  Labs (all labs ordered are listed, but only abnormal results are displayed) Labs Reviewed  POCT RAPID STREP A (OFFICE) - Abnormal; Notable for the following components:      Result Value   Rapid Strep A Screen Positive (*)    All other components within normal limits    EKG  Radiology No results found.  Procedures Procedures  (including critical care time)  Medications Ordered in UC Medications - No data to display  Initial Impression / Assessment and Plan / UC Course  I have reviewed the triage vital signs and the nursing notes.  Pertinent labs & imaging results that were available during my care of the patient were reviewed by me and considered in my medical decision making (see chart for details).     Strep positive-amoxicillin twice daily x10 days, continue symptomatic and supportive care rest and fluids.  No sign of peritonsillar abscess.  Discussed strict return precautions. Patient verbalized understanding and is agreeable with plan.  Final Clinical Impressions(s) / UC Diagnoses   Final diagnoses:  Streptococcal sore throat     Discharge Instructions      Sore Throat   Your rapid strep test was positive today. We will treat you for strep throat with an antibiotic. Please take Amoxicillin as prescribed.   Please continue Tylenol or Ibuprofen for fever and pain. May try salt water gargles, cepacol lozenges, throat spray, or OTC cold relief medicine for throat discomfort. If you also have congestion take a daily anti-histamine like Zyrtec, Claritin, and a oral decongestant to help with post nasal drip that may be irritating your throat.   Stay hydrated and drink plenty of fluids to keep your throat coated relieve irritation.      ED Prescriptions     Medication Sig Dispense Auth. Provider   amoxicillin (AMOXIL) 500 MG capsule Take 1 capsule (500 mg total) by mouth 2 (two) times daily for 10 days. 20 capsule , [place] C, PA-C      PDMP not reviewed this encounter.   Janith Lima, [place] 09/21/20 1902

## 2020-09-21 NOTE — ED Triage Notes (Signed)
Sore throat and LT ear pain that started this morning.

## 2020-09-21 NOTE — Discharge Instructions (Addendum)

## 2020-09-24 ENCOUNTER — Ambulatory Visit
Admission: EM | Admit: 2020-09-24 | Discharge: 2020-09-24 | Disposition: A | Discharge status: Home / Self Care | Payer: BC Managed Care – PPO | Attending: Emergency Medicine | Admitting: Emergency Medicine

## 2020-09-24 ENCOUNTER — Other Ambulatory Visit: Payer: Self-pay

## 2020-09-24 ENCOUNTER — Encounter: Payer: Self-pay | Admitting: Emergency Medicine

## 2020-09-24 DIAGNOSIS — R200 Anesthesia of skin: Secondary | ICD-10-CM | POA: Diagnosis not present

## 2020-09-24 MED ORDER — METHYLPREDNISOLONE 4 MG PO TBPK: ORAL_TABLET | ORAL | 0 refills | Status: DC

## 2020-09-24 NOTE — ED Triage Notes (Signed)
Hx of right leg numbness.  Today the leg feels like pins are sticking in leg.

## 2020-09-24 NOTE — ED Provider Notes (Signed)
[facility]   [id number] 09/24/20 Arrival Time: [id number]  CC:RT leg numbness/ tingling  SUBJECTIVE: History from: patient. Sonya Kline is a 50 y.o. female complains of RT upper thigh numbness and tingling x few days.  Denies a precipitating event or specific injury.  Localizes the pain to the RT upper outer thigh.  Denies alleviating or aggravating factors.  Denies similar symptoms in the past.  Denies fever, chills, erythema, ecchymosis, effusion, weakness, saddle paresthesias, loss of bowel or bladder function.      ROS: As per HPI.  All other pertinent ROS negative.     Past Medical History:  Diagnosis Date   Diabetes mellitus, type II ([place])    Diverticulosis    HTN (hypertension)    Past Surgical History:  Procedure Laterality Date   ABDOMINAL HYSTERECTOMY     CESAREAN SECTION     x 2   COLONOSCOPY N/A 08/22/2019   Procedure: COLONOSCOPY;  Surgeon: Daneil Dolin, MD;  [facility];  Service: Endoscopy;  Laterality: N/A;  9:30   POLYPECTOMY  08/22/2019   Procedure: POLYPECTOMY;  Surgeon: Daneil Dolin, MD;  [facility];  Service: Endoscopy;;   Allergies  Allergen Reactions   Contrast Media [Iodinated Diagnostic Agents] Hives   Prednisone Rash   No current facility-administered medications on file prior to encounter.   Current Outpatient Medications on File Prior to Encounter  Medication Sig Dispense Refill   amLODipine (NORVASC) 10 MG tablet Take 10 mg by mouth daily.      amoxicillin (AMOXIL) 500 MG capsule Take 1 capsule (500 mg total) by mouth 2 (two) times daily for 10 days. 20 capsule 0   BD PEN NEEDLE NANO 2ND GEN 32G X 4 MM MISC SMARTSIG:Pre-Filled Pen Syringe Injection Daily     glipiZIDE (GLUCOTROL XL) 5 MG 24 hr tablet Take 1 tablet (5 mg total) by mouth daily with breakfast. 90 tablet 1   ibuprofen (ADVIL) 600 MG tablet TAKE 1 TABLET(600 MG) BY MOUTH EVERY 6 HOURS AS NEEDED 30 tablet 0   LANTUS SOLOSTAR 100 UNIT/ML Solostar Pen  INJECT 40 UNITS UNDER THE SKIN AT BEDTIME 45 mL 0   meloxicam (MOBIC) 7.5 MG tablet Take 1 tablet (7.5 mg total) by mouth daily. 30 tablet 5   rosuvastatin (CRESTOR) 5 MG tablet Take 1 tablet (5 mg total) by mouth daily. 90 tablet 3   Social History   Socioeconomic History   Marital status: Married    Spouse name: Not on file   Number of children: Not on file   Years of education: college   Highest education level: Not on file  Occupational History   Not on file  Tobacco Use   Smoking status: Never   Smokeless tobacco: Never  Vaping Use   Vaping Use: Never used  Substance and Sexual Activity   Alcohol use: No   Drug use: No   Sexual activity: Yes    Birth control/protection: None, Surgical  Other Topics Concern   Not on file  Social History Narrative   Not on file   Social Determinants of Health   Financial Resource Strain: Not on file  Food Insecurity: Not on file  Transportation Needs: Not on file  Physical Activity: Not on file  Stress: Not on file  Social Connections: Not on file  Intimate Partner Violence: Not on file   Family History  Problem Relation Age of Onset   Diabetes Mother    Hypertension Mother  Hypertension Father     OBJECTIVE:  Vitals:   09/24/20 1908  BP: 119/77  Pulse: (!) 108  Resp: 16  Temp: 98.5 F (36.9 C)  TempSrc: Oral  SpO2: 96%    General appearance: ALERT; in no acute distress.  Head: NCAT Lungs: Normal respiratory effort CV: Dorsalis pedis pulses 2+ Musculoskeletal: Leg Inspection: Skin warm, dry, clear and intact without obvious erythema, effusion, or ecchymosis.  Palpation: decreased sensation to RT lateral thigh Skin: warm and dry Neurologic: Ambulates without difficulty Psychological: alert and cooperative; normal mood and affect  ASSESSMENT & PLAN:  1. Thigh numbness     Meds ordered this encounter  Medications   methylPREDNISolone (MEDROL DOSEPAK) 4 MG TBPK tablet    Sig: Use medrol dos pak as  directed; 6 pills by mouth on day 1, 5 pills day 2, 4 pills day 3, 3 pills day 4, 2 pills day 5, 1 pill day 1 (4 mg pills)    Dispense:  21 each    Refill:  0    Order Specific Question:   Supervising Provider    Answer:   Raylene Everts [id number]   Continue conservative management of rest, ice, and gentle stretches Take medrol dosepak as directed Follow up with PCP Return or go to the ER if you have any new or worsening symptoms (fever, chills, chest pain, abdominal pain, changes in bowel or bladder habits, pain radiating into lower legs, etc...)   Reviewed expectations re: course of current medical issues. Questions answered. Outlined signs and symptoms indicating need for more acute intervention. Patient verbalized understanding. After Visit Summary given.     Lestine Box, PA-C 09/24/20 1930

## 2020-09-24 NOTE — Discharge Instructions (Addendum)
Continue conservative management of rest, ice, and gentle stretches Take medrol dosepak as directed Follow up with PCP Return or go to the ER if you have any new or worsening symptoms (fever, chills, chest pain, abdominal pain, changes in bowel or bladder habits, pain radiating into lower legs, etc...)

## 2020-09-28 ENCOUNTER — Emergency Department (HOSPITAL_COMMUNITY): Payer: BC Managed Care – PPO

## 2020-09-28 ENCOUNTER — Other Ambulatory Visit: Payer: Self-pay

## 2020-09-28 ENCOUNTER — Encounter (HOSPITAL_COMMUNITY): Payer: Self-pay | Admitting: Emergency Medicine

## 2020-09-28 DIAGNOSIS — M1611 Unilateral primary osteoarthritis, right hip: Secondary | ICD-10-CM | POA: Diagnosis not present

## 2020-09-28 DIAGNOSIS — Z7984 Long term (current) use of oral hypoglycemic drugs: Secondary | ICD-10-CM | POA: Insufficient documentation

## 2020-09-28 DIAGNOSIS — M5441 Lumbago with sciatica, right side: Secondary | ICD-10-CM | POA: Insufficient documentation

## 2020-09-28 DIAGNOSIS — Z79899 Other long term (current) drug therapy: Secondary | ICD-10-CM | POA: Diagnosis not present

## 2020-09-28 DIAGNOSIS — M79601 Pain in right arm: Secondary | ICD-10-CM | POA: Diagnosis not present

## 2020-09-28 DIAGNOSIS — E119 Type 2 diabetes mellitus without complications: Secondary | ICD-10-CM | POA: Insufficient documentation

## 2020-09-28 DIAGNOSIS — G8929 Other chronic pain: Secondary | ICD-10-CM | POA: Diagnosis not present

## 2020-09-28 DIAGNOSIS — I10 Essential (primary) hypertension: Secondary | ICD-10-CM | POA: Insufficient documentation

## 2020-09-28 DIAGNOSIS — R200 Anesthesia of skin: Secondary | ICD-10-CM | POA: Diagnosis not present

## 2020-09-28 NOTE — ED Triage Notes (Signed)
Pt c/o right leg numbness with shooting pains. Pt seen at [facility] for same this week.

## 2020-09-29 ENCOUNTER — Emergency Department (HOSPITAL_COMMUNITY)
Admission: EM | Admit: 2020-09-29 | Discharge: 2020-09-29 | Disposition: A | Discharge status: Home / Self Care | Payer: BC Managed Care – PPO | Attending: Emergency Medicine | Admitting: Emergency Medicine

## 2020-09-29 DIAGNOSIS — M5431 Sciatica, right side: Secondary | ICD-10-CM

## 2020-09-29 MED ORDER — METHOCARBAMOL 500 MG PO TABS
500.0000 mg | ORAL_TABLET | Freq: Once | ORAL | Status: AC
  Administered 2020-09-29: 500 mg via ORAL
  Filled 2020-09-29: qty 1

## 2020-09-29 MED ORDER — METHYLPREDNISOLONE SODIUM SUCC 125 MG IJ SOLR
125.0000 mg | Freq: Once | INTRAMUSCULAR | Status: AC
  Administered 2020-09-29: 125 mg via INTRAMUSCULAR
  Filled 2020-09-29: qty 2

## 2020-09-29 MED ORDER — DEXAMETHASONE 2 MG PO TABS: 2.0000 mg | ORAL_TABLET | Freq: Two times a day (BID) | ORAL | 0 refills | Status: AC

## 2020-09-29 MED ORDER — METHOCARBAMOL 500 MG PO TABS: 500.0000 mg | ORAL_TABLET | Freq: Three times a day (TID) | ORAL | 0 refills | Status: DC | PRN

## 2020-09-29 MED ORDER — OXYCODONE-ACETAMINOPHEN 5-325 MG PO TABS
2.0000 | ORAL_TABLET | Freq: Once | ORAL | Status: AC
Start: 2020-09-29 — End: 2020-09-29
  Administered 2020-09-29: 2 via ORAL
  Filled 2020-09-29: qty 2

## 2020-09-29 NOTE — ED Provider Notes (Signed)
[facility] EMERGENCY DEPARTMENT Provider Note   CSN: [id number] Arrival date & time: 09/28/20  2242     History Chief Complaint  Patient presents with   Leg Pain    Sonya Kline is a 50 y.o. female.   Leg Pain Location:  Leg Injury: no   Leg location:  R upper leg Pain details:    Quality:  Sharp and aching   Radiates to:  Does not radiate   Severity:  Mild   Timing:  Constant Chronicity:  New Dislocation: no   Prior injury to area:  No Relieved by:  None tried Worsened by:  Nothing Ineffective treatments:  None tried Associated symptoms: back pain   Associated symptoms: no fever, no itching, no neck pain, no numbness, no swelling and no tingling       Past Medical History:  Diagnosis Date   Diabetes mellitus, type II ([place])    Diverticulosis    HTN (hypertension)     Patient Active Problem List   Diagnosis Date Noted   Uncontrolled type 2 diabetes mellitus with hyperglycemia ([place]) 10/29/2019   Essential hypertension, benign 10/29/2019   Pain of upper abdomen    Ileus ([place]) 04/23/2018   Labral tear of hip, degenerative 10/16/2013   Ankle pain, left 05/03/2011   DIVERTICULITIS, HX OF 03/09/2009    Past Surgical History:  Procedure Laterality Date   ABDOMINAL HYSTERECTOMY     CESAREAN SECTION     x 2   COLONOSCOPY N/A 08/22/2019   Procedure: COLONOSCOPY;  Surgeon: Daneil Dolin, MD;  Location: [facility] END[facility];  Service: Endoscopy;  Laterality: N/A;  9:30   POLYPECTOMY  08/22/2019   Procedure: POLYPECTOMY;  Surgeon: Daneil Dolin, MD;  Location: [facility] ENDO [facility];  Service: Endoscopy;;     OB History     Gravida  4   Para  2   Term  2   Preterm      AB  2   Living  2      SAB  2   IAB      Ectopic      Multiple      Live Births              Family History  Problem Relation Age of Onset   Diabetes Mother    Hypertension Mother    Hypertension Father     Social History   Tobacco Use   Smoking status: Never   Smokeless  tobacco: Never  Vaping Use   Vaping Use: Never used  Substance Use Topics   Alcohol use: No   Drug use: No    Home Medications Prior to Admission medications   Medication Sig Start Date End Date Taking? Authorizing Provider  dexamethasone (DECADRON) 2 MG tablet Take 1 tablet (2 mg total) by mouth 2 (two) times daily with a meal for 7 days. 09/29/20 10/06/20 Yes , Corene Cornea, MD  methocarbamol (ROBAXIN) 500 MG tablet Take 1 tablet (500 mg total) by mouth every 8 (eight) hours as needed for muscle spasms. 09/29/20  Yes , Corene Cornea, MD  amLODipine (NORVASC) 10 MG tablet Take 10 mg by mouth daily.     [provider]  amoxicillin (AMOXIL) 500 MG capsule Take 1 capsule (500 mg total) by mouth 2 (two) times daily for 10 days. 09/21/20 10/01/20  Wieters, Hallie C, PA-C  BD PEN NEEDLE NANO 2ND GEN 32G X 4 MM MISC SMARTSIG:Pre-Filled Pen Syringe Injection Daily 10/28/19   [provider]  glipiZIDE (GLUCOTROL XL) 5 MG 24 hr tablet Take 1 tablet (5 mg total) by mouth daily with breakfast. 05/31/20   Brita Romp, NP  ibuprofen (ADVIL) 600 MG tablet TAKE 1 TABLET(600 MG) BY MOUTH EVERY 6 HOURS AS NEEDED 07/15/20   Mordecai Rasmussen, MD  LANTUS SOLOSTAR 100 UNIT/ML Solostar Pen INJECT 40 UNITS UNDER THE SKIN AT BEDTIME 09/06/20   Brita Romp, NP  meloxicam (MOBIC) 7.5 MG tablet Take 1 tablet (7.5 mg total) by mouth daily. 08/04/20   Carole Civil, MD  methylPREDNISolone (MEDROL DOSEPAK) 4 MG TBPK tablet Use medrol dos pak as directed; 6 pills by mouth on day 1, 5 pills day 2, 4 pills day 3, 3 pills day 4, 2 pills day 5, 1 pill day 1 (4 mg pills) 09/24/20   Wurst, [place], PA-C  rosuvastatin (CRESTOR) 5 MG tablet Take 1 tablet (5 mg total) by mouth daily. 01/29/20   Brita Romp, NP    Allergies    Contrast media [iodinated diagnostic agents] and Prednisone  Review of Systems   Review of Systems  Constitutional:  Negative for fever.  Musculoskeletal:  Positive for back  pain. Negative for neck pain.  Skin:  Negative for itching.  All other systems reviewed and are negative.  Physical Exam Updated Vital Signs BP (!) 161/102 (BP Location: Left Arm)   Pulse (!) 107   Temp 98.1 F (36.7 C) (Oral)   Resp 19   Ht '5\' 2"'$  (1.575 m)   Wt 100.2 kg   LMP 11/14/2011   SpO2 96%   BMI 40.40 kg/m   Physical Exam Vitals and nursing note reviewed.  Constitutional:      Appearance: She is well-developed.  HENT:     Head: Normocephalic and atraumatic.     Mouth/Throat:     Mouth: Mucous membranes are moist.     Pharynx: Oropharynx is clear.  Eyes:     Pupils: Pupils are equal, round, and reactive to light.  Cardiovascular:     Rate and Rhythm: Normal rate and regular rhythm.  Pulmonary:     Effort: No respiratory distress.     Breath sounds: No stridor.  Abdominal:     General: Abdomen is flat. There is no distension.  Musculoskeletal:        General: Tenderness (right lower lumbar area, nothing midline) present.     Cervical back: Normal range of motion.  Skin:    General: Skin is warm and dry.  Neurological:     General: No focal deficit present.     Mental Status: She is alert.    ED Results / Procedures / Treatments   Labs (all labs ordered are listed, but only abnormal results are displayed) Labs Reviewed - No data to display  EKG None  Radiology DG Hip Unilat W or Wo Pelvis 2-3 Views Right  Result Date: 09/28/2020 CLINICAL DATA:  Right hip numbness and pain. No known injury. History of chronic hip pain and numbness. EXAM: DG HIP (WITH OR WITHOUT PELVIS) 2-3V RIGHT COMPARISON:  Hip MRI 10/30/2013 FINDINGS: Right hip joint space is preserved. There is minimal lateral acetabular spurring. The femoral head is well seated. No evidence of fracture, avascular necrosis, focal lesion or bone destruction. Pubic rami are intact. Pubic symphysis and sacroiliac joints are congruent. No suspicious soft tissue abnormality. IMPRESSION: Minimal  osteoarthritis of the right hip. Electronically Signed   By: Keith Rake M.D.   On: 09/28/2020 23:19  Procedures Procedures   Medications Ordered in ED Medications  methylPREDNISolone sodium succinate (SOLU-MEDROL) 125 mg/2 mL injection 125 mg (125 mg Intramuscular Given 09/29/20 0108)  oxyCODONE-acetaminophen (PERCOCET/ROXICET) 5-325 MG per tablet 2 tablet (2 tablets Oral Given 09/29/20 0109)  methocarbamol (ROBAXIN) tablet 500 mg (500 mg Oral Given 09/29/20 0108)    ED Course  I have reviewed the triage vital signs and the nursing notes.  Pertinent labs & imaging results that were available during my care of the patient were reviewed by me and considered in my medical decision making (see chart for details).    MDM Rules/Calculators/A&P                           Patient presents the emergency department today with atraumatic lower back pain.  The differential is large and includes emergent causes such as: fracture, epidural abscess, transverse myelitis, sciatica, musculoskeletal causes, hematoma amongst many others.  However, without any red flags such as trauma, unexplained weight loss, neuro findings, age, fever, IVDU, steroid use, cancer history, length of symptoms I don't see an indication for imaging. I think the pain is most likely from sciatica. I will treat supportively with PCP follow up for improvement and further consideration. Return here if worsening symptoms.  Final Clinical Impression(s) / ED Diagnoses Final diagnoses:  Sciatica of right side    Rx / DC Orders ED Discharge Orders          Ordered    dexamethasone (DECADRON) 2 MG tablet  2 times daily with meals        09/29/20 0102    methocarbamol (ROBAXIN) 500 MG tablet  Every 8 hours PRN        09/29/20 0102             , Corene Cornea, MD 09/29/20 [id number]

## 2020-10-04 ENCOUNTER — Ambulatory Visit: Payer: BC Managed Care – PPO | Admitting: Nurse Practitioner

## 2020-10-08 ENCOUNTER — Ambulatory Visit: Payer: BC Managed Care – PPO | Admitting: Nurse Practitioner

## 2020-10-13 ENCOUNTER — Ambulatory Visit: Payer: BC Managed Care – PPO | Admitting: Gastroenterology

## 2020-10-20 ENCOUNTER — Other Ambulatory Visit: Payer: Self-pay

## 2020-10-20 ENCOUNTER — Encounter: Payer: Self-pay | Admitting: Nurse Practitioner

## 2020-10-20 ENCOUNTER — Ambulatory Visit: Payer: BC Managed Care – PPO | Admitting: Nurse Practitioner

## 2020-10-20 VITALS — BP 101/68 | HR 116 | Ht 62.0 in | Wt 220.8 lb

## 2020-10-20 DIAGNOSIS — I10 Essential (primary) hypertension: Secondary | ICD-10-CM | POA: Diagnosis not present

## 2020-10-20 DIAGNOSIS — E1165 Type 2 diabetes mellitus with hyperglycemia: Secondary | ICD-10-CM

## 2020-10-20 LAB — POCT GLYCOSYLATED HEMOGLOBIN (HGB A1C): Hemoglobin A1C: 9.5 % — AB (ref 4.0–5.6)

## 2020-10-20 MED ORDER — LANTUS SOLOSTAR 100 UNIT/ML ~~LOC~~ SOPN: 40.0000 [IU] | PEN_INJECTOR | Freq: Every day | SUBCUTANEOUS | 3 refills | Status: DC

## 2020-10-20 NOTE — Patient Instructions (Signed)
Advice for Weight Management  -For most of us the best way to lose weight is by diet management. Generally speaking, diet management means consuming less calories intentionally which over time brings about progressive weight loss.  This can be achieved more effectively by restricting carbohydrate consumption to the minimum possible.  So, it is critically important to know your numbers: how much calorie you are consuming and how much calorie you need. More importantly, our carbohydrates sources should be unprocessed or minimally processed complex starch food items.   Sometimes, it is important to balance nutrition by increasing protein intake (animal or plant source), fruits, and vegetables.  -Sticking to a routine mealtime to eat 3 meals a day and avoiding unnecessary snacks is shown to have a big role in weight control. Under normal circumstances, the only time we lose real weight is when we are hungry, so allow hunger to take place- hunger means no food between meal times, only water.  It is not advisable to starve.   -It is better to avoid simple carbohydrates including: Cakes, Sweet Desserts, Ice Cream, Soda (diet and regular), Sweet Tea, Candies, Chips, Cookies, Store Bought Juices, Alcohol in Excess of  1-2 drinks a day, Artificial Sweeteners, Doughnuts, Coffee Creamers, "Sugar-free" Products, etc, etc.  This is not a complete list.....    -Consulting with certified diabetes educators is proven to provide you with the most accurate and current information on diet.  Also, you may be  interested in discussing diet options/exchanges , we can schedule a visit with Sonya Kline, RDN, CDE for individualized nutrition education.  -Exercise: If you are able: 30 -60 minutes a day ,4 days a week, or 150 minutes a week.  The longer the better.  Combine stretch, strength, and aerobic activities.  If you were told in the past that you have high risk for cardiovascular diseases, you may seek evaluation by  your heart doctor prior to initiating moderate to intense exercise programs.    

## 2020-10-20 NOTE — Progress Notes (Signed)
10/20/2020, 3:55 PM  Endocrinology follow-up note   Subjective:    Patient ID: Sonya Kline, female    DOB: [birth year].  Sonya Kline is being seen in follow-up after she was seen in consultation for management of currently uncontrolled symptomatic diabetes requested by  Asencion Noble, MD.   Past Medical History:  Diagnosis Date   Diabetes mellitus, type II ([place])    Diverticulosis    HTN (hypertension)     Past Surgical History:  Procedure Laterality Date   ABDOMINAL HYSTERECTOMY     CESAREAN SECTION     x 2   COLONOSCOPY N/A 08/22/2019   Procedure: COLONOSCOPY;  Surgeon: Daneil Dolin, MD;  [facility];  Service: Endoscopy;  Laterality: N/A;  9:30   POLYPECTOMY  08/22/2019   Procedure: POLYPECTOMY;  Surgeon: Daneil Dolin, MD;  Location: [facility];  Service: Endoscopy;;    Social History   Socioeconomic History   Marital status: Married    Spouse name: Not on file   Number of children: Not on file   Years of education: college   Highest education level: Not on file  Occupational History   Not on file  Tobacco Use   Smoking status: Never   Smokeless tobacco: Never  Vaping Use   Vaping Use: Never used  Substance and Sexual Activity   Alcohol use: No   Drug use: No   Sexual activity: Yes    Birth control/protection: None, Surgical  Other Topics Concern   Not on file  Social History Narrative   Not on file   Social Determinants of Health   Financial Resource Strain: Not on file  Food Insecurity: Not on file  Transportation Needs: Not on file  Physical Activity: Not on file  Stress: Not on file  Social Connections: Not on file    Family History  Problem Relation Age of Onset   Diabetes Mother    Hypertension Mother    Hypertension Father     Outpatient Encounter Medications as of 10/20/2020  Medication Sig   amLODipine (NORVASC) 10 MG tablet Take 10 mg by  mouth daily.    BD PEN NEEDLE NANO 2ND GEN 32G X 4 MM MISC SMARTSIG:Pre-Filled Pen Syringe Injection Daily   glipiZIDE (GLUCOTROL XL) 5 MG 24 hr tablet Take 1 tablet (5 mg total) by mouth daily with breakfast.   ibuprofen (ADVIL) 600 MG tablet TAKE 1 TABLET(600 MG) BY MOUTH EVERY 6 HOURS AS NEEDED   meloxicam (MOBIC) 7.5 MG tablet Take 1 tablet (7.5 mg total) by mouth daily.   methocarbamol (ROBAXIN) 500 MG tablet Take 1 tablet (500 mg total) by mouth every 8 (eight) hours as needed for muscle spasms.   rosuvastatin (CRESTOR) 5 MG tablet Take 1 tablet (5 mg total) by mouth daily.   [DISCONTINUED] LANTUS SOLOSTAR 100 UNIT/ML Solostar Pen INJECT 40 UNITS UNDER THE SKIN AT BEDTIME   insulin glargine (LANTUS SOLOSTAR) 100 UNIT/ML Solostar Pen Inject 40 Units into the skin at bedtime.   [DISCONTINUED] methylPREDNISolone (MEDROL DOSEPAK) 4 MG TBPK tablet Use medrol dos pak as directed; 6 pills by mouth on day  1, 5 pills day 2, 4 pills day 3, 3 pills day 4, 2 pills day 5, 1 pill day 1 (4 mg pills)   No facility-administered encounter medications on file as of 10/20/2020.    ALLERGIES: Allergies  Allergen Reactions   Contrast Media [Iodinated Diagnostic Agents] Hives   Prednisone Rash    VACCINATION STATUS: Immunization History  Administered Date(s) Administered   Rabies, IM 03/17/2013, 03/20/2013, 03/27/2013, 04/11/2013   Tdap 03/17/2013    Diabetes She presents for her follow-up diabetic visit. She has type 2 diabetes mellitus. Onset time: She was diagnosed at approximate age of 8 years. Her disease course has been worsening. There are no hypoglycemic associated symptoms. Pertinent negatives for hypoglycemia include no confusion, headaches, pallor or seizures. Pertinent negatives for diabetes include no chest pain, no fatigue, no polydipsia, no polyphagia and no polyuria. There are no hypoglycemic complications. Symptoms are stable. There are no diabetic complications. Risk factors for coronary  artery disease include diabetes mellitus, hypertension, obesity and dyslipidemia. Current diabetic treatment includes insulin injections and oral agent (monotherapy). She is compliant with treatment most of the time. Her weight is stable. She is following a generally unhealthy diet. When asked about meal planning, she reported none. She has not had a previous visit with a dietitian. She rarely participates in exercise. Her home blood glucose trend is fluctuating dramatically. Her breakfast blood glucose range is generally 130-140 mg/dl. Her bedtime blood glucose range is generally >200 mg/dl. (She presents today with her logs, no meter showing near target fasting and significantly above target postprandial glycemic profile.  Her POCT A1c today is 9.5%, increasing from last visit of 7.9%.  She reports she has taken oral steroids since last visit and has been dealing with an intermittent sore throat for weeks now.  She does have an appt with PCP to discuss this issue.  She denies any hypoglycemia.  Says she does not consume sweetened beverages.) An ACE inhibitor/angiotensin II receptor blocker is not being taken. She does not see a podiatrist.Eye exam is current.  Hypertension This is a chronic problem. The current episode started more than 1 year ago. The problem has been resolved since onset. The problem is controlled. Pertinent negatives include no chest pain, headaches, palpitations or shortness of breath. There are no associated agents to hypertension. Risk factors for coronary artery disease include diabetes mellitus, family history, obesity, sedentary lifestyle and dyslipidemia. Past treatments include calcium channel blockers. The current treatment provides mild improvement. Compliance problems include diet and exercise.   Hyperlipidemia This is a chronic problem. The current episode started more than 1 year ago. The problem is uncontrolled. Recent lipid tests were reviewed and are high. Exacerbating  diseases include diabetes. There are no known factors aggravating her hyperlipidemia. Pertinent negatives include no chest pain or shortness of breath. Current antihyperlipidemic treatment includes statins. The current treatment provides mild improvement of lipids. Compliance problems include adherence to diet and adherence to exercise.  Risk factors for coronary artery disease include diabetes mellitus, dyslipidemia, hypertension, obesity and a sedentary lifestyle.   Review of systems  Constitutional: + stable body weight,  current Body mass index is 40.38 kg/m. , no fatigue, no subjective hyperthermia, no subjective hypothermia Eyes: no blurry vision, no xerophthalmia ENT: intermittent sore throat for weeks now, no nodules palpated in throat, no dysphagia/odynophagia, no hoarseness Cardiovascular: no chest pain, no shortness of breath, no palpitations, no leg swelling Respiratory: no cough, no shortness of breath Gastrointestinal: no nausea/vomiting/diarrhea Musculoskeletal: no muscle/joint aches  Skin: no rashes, no hyperemia Neurological: no tremors, no numbness, no tingling, no dizziness Psychiatric: no depression, no anxiety   Objective:    BP 101/68   Pulse (!) 116   Ht '5\' 2"'$  (1.575 m)   Wt 220 lb 12.8 oz (100.2 kg)   LMP 11/14/2011   BMI 40.38 kg/m   Wt Readings from Last 3 Encounters:  10/20/20 220 lb 12.8 oz (100.2 kg)  09/28/20 220 lb 14.4 oz (100.2 kg)  08/19/20 221 lb (100.2 kg)    BP Readings from Last 3 Encounters:  10/20/20 101/68  09/29/20 (!) 161/102  09/24/20 119/77     Physical Exam- Limited  Constitutional:  Body mass index is 40.38 kg/m. , not in acute distress, normal state of mind Eyes:  EOMI, no exophthalmos Cardiovascular: RRR, no murmurs, rubs, or gallops, no edema Respiratory: Adequate breathing efforts, no crackles, rales, rhonchi, or wheezing Musculoskeletal: no gross deformities, strength intact in all four extremities, no gross restriction  of joint movements Skin:  no rashes, no hyperemia Neurological: no tremor with outstretched hands     CMP ( most recent) CMP     Component Value Date/Time   NA 144 01/21/2020 0737   K 4.7 01/21/2020 0737   CL 103 01/21/2020 0737   CO2 26 01/21/2020 0737   GLUCOSE 99 01/21/2020 0737   GLUCOSE 110 (H) 04/25/2018 0433   BUN 16 01/21/2020 0737   CREATININE 0.78 01/21/2020 0737   CALCIUM 9.2 01/21/2020 0737   PROT 7.5 01/21/2020 0737   ALBUMIN 3.9 01/21/2020 0737   AST 16 01/21/2020 0737   ALT 11 01/21/2020 0737   ALKPHOS 99 01/21/2020 0737   BILITOT 0.3 01/21/2020 0737   GFRNONAA 88 01/21/2020 0737   GFRAA 102 01/21/2020 0737     Diabetic Labs (most recent): Lab Results  Component Value Date   HGBA1C 9.5 (A) 10/20/2020   HGBA1C 7.9 (A) 05/31/2020   HGBA1C 8.0 (A) 01/29/2020        Assessment & Plan:   1) Uncontrolled type 2 diabetes mellitus with hyperglycemia ([facility])  - Sonya Kline has currently uncontrolled symptomatic type 2 DM since 50 years of age.  Recent labs reviewed.   She presents today with her logs, no meter showing near target fasting and significantly above target postprandial glycemic profile.  Her POCT A1c today is 9.5%, increasing from last visit of 7.9%.  She reports she has taken oral steroids since last visit and has been dealing with an intermittent sore throat for weeks now.  She does have an appt with PCP to discuss this issue.  She denies any hypoglycemia.  Says she does not consume sweetened beverages.  - I had a long discussion with her about the progressive nature of diabetes and the pathology behind its complications. -She does not report gross complications of diabetes, however she remains at a high risk for more acute and chronic complications which include CAD, CVA, CKD, retinopathy, and neuropathy. These are all discussed in detail with her.  - Nutritional counseling repeated at each appointment due to patients tendency to fall back  in to old habits.  - The patient admits there is a room for improvement in their diet and drink choices. -  Suggestion is made for the patient to avoid simple carbohydrates from their diet including Cakes, Sweet Desserts / Pastries, Ice Cream, Soda (diet and regular), Sweet Tea, Candies, Chips, Cookies, Sweet Pastries, Store Bought Juices, Alcohol in Excess of 1-2 drinks a day, Artificial  Sweeteners, Coffee Creamer, and "Sugar-free" Products. This will help patient to have stable blood glucose profile and potentially avoid unintended weight gain.   - I encouraged the patient to switch to unprocessed or minimally processed complex starch and increased protein intake (animal or plant source), fruits, and vegetables.   - Patient is advised to stick to a routine mealtimes to eat 3 meals a day and avoid unnecessary snacks (to snack only to correct hypoglycemia).  - she is following with Jearld Fenton, RDN, CDE for diabetes education.  - I have approached her with the following individualized plan to manage  her diabetes and patient agrees:   -In light of her prevailing glycemic burden, she will continue to need insulin treatment in order for her to achieve and maintain control of diabetes to target.   -She is tolerating her current dose of Lantus well.  Based on her near target fasting glucose readings, she will not tolerate increase.  She is advised to continue Glipizide 5 mg XL daily with breakfast.      -She will be considered for bolus insulin if she continues to have significant postprandial hyperglycemia.  She thinks this is due to her chronic intermittent sore throat, wants to have it checked out by PCP first before adjusting medications.  - she is encouraged to continue monitoring blood glucose twice daily, before breakfast and before bed, and to call the clinic if she has readings less than 70 or greater than 300 for 3 tests in a row.  - she will be considered for incretin therapy as  appropriate next visit.  - Specific targets for  A1c;  LDL, HDL,  and Triglycerides were discussed with the patient.  2) Blood Pressure /Hypertension:  Her blood pressure is controlled to target.  She is advised to continue Norvasc 10 mg po daily.    3) Lipids/Hyperlipidemia:  Her most recent lipid panel from 01/21/20 shows uncontrolled LDL at 169.   She is advised to continue Crestor 5 mg po daily before bed.  Side effects and precautions discussed with her.  Will recheck lipid panel prior to next visit.  4)  Weight/Diet:  Her Body mass index is 40.38 kg/m.  -   clearly complicating her diabetes care.   she is a candidate for weight loss. I discussed with her the fact that loss of 5 - 10% of her  current body weight will have the most impact on her diabetes management.  Exercise, and detailed carbohydrates information provided  -  detailed on discharge instructions.  5) Chronic Care/Health Maintenance: -she is not on ACEI/ARB is on Statin medications and is encouraged to initiate and continue to follow up with Ophthalmology, Dentist,  Podiatrist at least yearly or according to recommendations, and advised to stay away from smoking. I have recommended yearly flu vaccine and pneumonia vaccine at least every 5 years; moderate intensity exercise for up to 150 minutes weekly; and  sleep for at least 7 hours a day.  - she is advised to maintain close follow up with Asencion Noble, MD for primary care needs, as well as her other providers for optimal and coordinated care.      I spent 25 minutes in the care of the patient today including review of labs from [facility], Lipids, Thyroid Function, Hematology (current and previous including abstractions from other facilities); face-to-face time discussing  her blood glucose readings/logs, discussing hypoglycemia and hyperglycemia episodes and symptoms, medications doses, her options of short and long term treatment based  on the latest standards of care /  guidelines;  discussion about incorporating lifestyle medicine;  and documenting the encounter.    Please refer to Patient Instructions for Blood Glucose Monitoring and Insulin/Medications Dosing Guide"  in media tab for additional information. Please  also refer to " Patient Self Inventory" in the Media  tab for reviewed elements of pertinent patient history.  [name] participated in the discussions, expressed understanding, and voiced agreement with the above plans.  All questions were answered to her satisfaction. she is encouraged to contact clinic should she have any questions or concerns prior to her return visit.    Follow up plan: - Return in about 3 months (around 01/19/2021) for Diabetes F/U- A1c and UM in office, Previsit labs, Bring meter and logs.   Rayetta Pigg, [facility] [address] [place], [place] [address] Phone: [phone] Fax: [phone]  10/20/2020, 3:55 PM

## 2020-10-29 DIAGNOSIS — M62830 Muscle spasm of back: Secondary | ICD-10-CM | POA: Diagnosis not present

## 2020-10-29 DIAGNOSIS — M9903 Segmental and somatic dysfunction of lumbar region: Secondary | ICD-10-CM | POA: Diagnosis not present

## 2020-10-29 DIAGNOSIS — M546 Pain in thoracic spine: Secondary | ICD-10-CM | POA: Diagnosis not present

## 2020-10-29 DIAGNOSIS — M9902 Segmental and somatic dysfunction of thoracic region: Secondary | ICD-10-CM | POA: Diagnosis not present

## 2020-11-02 DIAGNOSIS — E7800 Pure hypercholesterolemia, unspecified: Secondary | ICD-10-CM | POA: Insufficient documentation

## 2020-11-02 DIAGNOSIS — E119 Type 2 diabetes mellitus without complications: Secondary | ICD-10-CM | POA: Insufficient documentation

## 2020-11-17 ENCOUNTER — Encounter: Payer: Self-pay | Admitting: Internal Medicine

## 2020-11-18 ENCOUNTER — Encounter: Payer: Self-pay | Admitting: Internal Medicine

## 2020-12-10 ENCOUNTER — Other Ambulatory Visit: Payer: Self-pay | Admitting: Nurse Practitioner

## 2020-12-23 ENCOUNTER — Ambulatory Visit: Payer: BC Managed Care – PPO | Admitting: Internal Medicine

## 2020-12-23 ENCOUNTER — Other Ambulatory Visit: Payer: Self-pay

## 2020-12-23 ENCOUNTER — Ambulatory Visit: Payer: BC Managed Care – PPO | Admitting: Orthopedic Surgery

## 2020-12-23 ENCOUNTER — Encounter: Payer: Self-pay | Admitting: Orthopedic Surgery

## 2020-12-23 ENCOUNTER — Ambulatory Visit (INDEPENDENT_AMBULATORY_CARE_PROVIDER_SITE_OTHER): Payer: BC Managed Care – PPO | Admitting: Orthopedic Surgery

## 2020-12-23 VITALS — BP 129/76 | HR 102 | Ht 62.0 in | Wt 213.0 lb

## 2020-12-23 DIAGNOSIS — G8929 Other chronic pain: Secondary | ICD-10-CM | POA: Diagnosis not present

## 2020-12-23 DIAGNOSIS — M25561 Pain in right knee: Secondary | ICD-10-CM

## 2020-12-23 DIAGNOSIS — M25562 Pain in left knee: Secondary | ICD-10-CM

## 2020-12-23 NOTE — Progress Notes (Signed)
.  cc Chief Complaint  Patient presents with   Knee Pain    Bilateral swelling painful    Encounter Diagnosis  Name Primary?   Bilateral chronic knee pain Yes   Procedure note for bilateral knee injections  Procedure note left knee injection verbal consent was obtained to inject left knee joint  Timeout was completed to confirm the site of injection  The medications used were 40 mg depomedrol 3 cc 1% lidocaine   Anesthesia was provided by ethyl chloride and the skin was prepped with alcohol.  After cleaning the skin with alcohol a 20-gauge needle was used to inject the left knee joint. There were no complications. A sterile bandage was applied.   Procedure note right knee injection verbal consent was obtained to inject right knee joint  Timeout was completed to confirm the site of injection  The medications used were 40 mg depomedrol 3 cc 1% lidocaine   Anesthesia was provided by ethyl chloride and the skin was prepped with alcohol.  After cleaning the skin with alcohol a 20-gauge needle was used to inject the right knee joint. There were no complications. A sterile bandage was applied.

## 2020-12-31 ENCOUNTER — Ambulatory Visit: Payer: BC Managed Care – PPO | Admitting: Gastroenterology

## 2021-01-12 ENCOUNTER — Encounter: Payer: Self-pay | Admitting: Internal Medicine

## 2021-01-12 ENCOUNTER — Other Ambulatory Visit: Payer: Self-pay

## 2021-01-12 ENCOUNTER — Ambulatory Visit: Payer: BC Managed Care – PPO | Admitting: Internal Medicine

## 2021-01-12 ENCOUNTER — Encounter: Payer: Self-pay | Admitting: *Deleted

## 2021-01-12 VITALS — BP 104/67 | HR 102 | Temp 97.3°F | Ht 62.0 in | Wt 215.6 lb

## 2021-01-12 DIAGNOSIS — R1319 Other dysphagia: Secondary | ICD-10-CM

## 2021-01-12 NOTE — Progress Notes (Signed)
Referring Provider: Asencion Noble, MD Primary Care Physician:  Asencion Noble, MD Primary GI:  Dr. Abbey Chatters  Chief Complaint  Patient presents with   Dysphagia    Sometimes things get stuck    HPI:   Sonya Kline is a 50 y.o. female who presents for follow-up visit.  Chief complaint for me today of dysphagia.  States this is progressively worsened over the last few months.  Feels as though food and sometimes liquid will get stuck in her throat.  No regurgitation.  No melena hematochezia.  Does note chronic NSAID use with Mobic.  No previous upper endoscopy.  Colonoscopy 08/22/2019 unremarkable.  1 polyp removed which was benign.  Recall 2031.  Past Medical History:  Diagnosis Date   Diabetes mellitus, type II ([place])    Diverticulosis    HTN (hypertension)     Past Surgical History:  Procedure Laterality Date   ABDOMINAL HYSTERECTOMY     CESAREAN SECTION     x 2   COLONOSCOPY N/A 08/22/2019   Procedure: COLONOSCOPY;  Surgeon: Daneil Dolin, MD;  [facility];  Service: Endoscopy;  Laterality: N/A;  9:30   POLYPECTOMY  08/22/2019   Procedure: POLYPECTOMY;  Surgeon: Daneil Dolin, MD;  [facility];  Service: Endoscopy;;    Current Outpatient Medications  Medication Sig Dispense Refill   amLODipine (NORVASC) 10 MG tablet Take 10 mg by mouth daily.      BD PEN NEEDLE NANO 2ND GEN 32G X 4 MM MISC SMARTSIG:Pre-Filled Pen Syringe Injection Daily     glipiZIDE (GLUCOTROL XL) 5 MG 24 hr tablet Take 1 tablet (5 mg total) by mouth daily with breakfast. 90 tablet 1   ibuprofen (ADVIL) 600 MG tablet TAKE 1 TABLET(600 MG) BY MOUTH EVERY 6 HOURS AS NEEDED 30 tablet 0   insulin glargine (LANTUS SOLOSTAR) 100 UNIT/ML Solostar Pen Inject 40 Units into the skin at bedtime. 45 mL 3   meloxicam (MOBIC) 7.5 MG tablet Take 1 tablet (7.5 mg total) by mouth daily. 30 tablet 5   rosuvastatin (CRESTOR) 5 MG tablet TAKE 1 TABLET(5 MG) BY MOUTH DAILY 90 tablet 0   No current  facility-administered medications for this visit.    Allergies as of 01/12/2021 - Review Complete 01/12/2021  Allergen Reaction Noted   Contrast media [iodinated diagnostic agents] Hives 11/21/2011   Prednisone Rash 12/20/2013    Family History  Problem Relation Age of Onset   Diabetes Mother    Hypertension Mother    Hypertension Father     Social History   Socioeconomic History   Marital status: Married    Spouse name: Not on file   Number of children: Not on file   Years of education: college   Highest education level: Not on file  Occupational History   Not on file  Tobacco Use   Smoking status: Never   Smokeless tobacco: Never  Vaping Use   Vaping Use: Never used  Substance and Sexual Activity   Alcohol use: No   Drug use: No   Sexual activity: Yes    Birth control/protection: None, Surgical  Other Topics Concern   Not on file  Social History Narrative   Not on file   Social Determinants of Health   Financial Resource Strain: Not on file  Food Insecurity: Not on file  Transportation Needs: Not on file  Physical Activity: Not on file  Stress: Not on file  Social Connections: Not on file  Subjective: Review of Systems  Constitutional:  Negative for chills and fever.  HENT:  Negative for congestion and hearing loss.   Eyes:  Negative for blurred vision and double vision.  Respiratory:  Negative for cough and shortness of breath.   Cardiovascular:  Negative for chest pain and palpitations.  Gastrointestinal:  Negative for abdominal pain, blood in stool, constipation, diarrhea, heartburn, melena and vomiting.       Dysphagia  Genitourinary:  Negative for dysuria and urgency.  Musculoskeletal:  Negative for joint pain and myalgias.  Skin:  Negative for itching and rash.  Neurological:  Negative for dizziness and headaches.  Psychiatric/Behavioral:  Negative for depression. The patient is not nervous/anxious.     Objective: BP 104/67   Pulse (!)  102   Temp (!) 97.3 F (36.3 C) (Temporal)   Ht 5\' 2"  (1.575 m)   Wt 215 lb 9.6 oz (97.8 kg)   LMP 11/14/2011   BMI 39.43 kg/m  Physical Exam Constitutional:      Appearance: Normal appearance.  HENT:     Head: Normocephalic and atraumatic.  Eyes:     Extraocular Movements: Extraocular movements intact.     Conjunctiva/sclera: Conjunctivae normal.  Cardiovascular:     Rate and Rhythm: Normal rate and regular rhythm.  Pulmonary:     Effort: Pulmonary effort is normal.     Breath sounds: Normal breath sounds.  Abdominal:     General: Bowel sounds are normal.     Palpations: Abdomen is soft.  Musculoskeletal:        General: No swelling. Normal range of motion.     Cervical back: Normal range of motion and neck supple.  Skin:    General: Skin is warm and dry.     Coloration: Skin is not jaundiced.  Neurological:     General: No focal deficit present.     Mental Status: She is alert and oriented to person, place, and time.  Psychiatric:        Mood and Affect: Mood normal.        Behavior: Behavior normal.     Assessment: *Dysphagia  Plan: Will schedule for EGD with possible dilation to evaluate for peptic ulcer disease, esophagitis, gastritis, H. Pylori, duodenitis, or other. Will also evaluate for esophageal stricture, Schatzki's ring, esophageal web or other. The risks including infection, bleed, or perforation as well as benefits, limitations, alternatives and imponderables have been reviewed with the patient. Potential for esophageal dilation, biopsy, etc. have also been reviewed.  Questions have been answered. All parties agreeable.  Colonoscopy recall 2031  Further recommendations to follow.    01/12/2021 10:57 AM   Disclaimer: This note was dictated with voice recognition software. Similar sounding words can inadvertently be transcribed and may not be corrected upon review.

## 2021-01-12 NOTE — Patient Instructions (Signed)
We will schedule you for upper endoscopy to further evaluate your difficulty swallowing.  I may perform esophageal dilation depending on findings.  Further recommendations to follow.  It was very nice meeting you today.  Dr. Abbey Kline  At [facility] we value your feedback. You may receive a survey about your visit today. Please share your experience as we strive to create trusting relationships with our patients to provide genuine, compassionate, quality care.  We appreciate your understanding and patience as we review any laboratory studies, imaging, and other diagnostic tests that are ordered as we care for you. Our office policy is 5 business days for review of these results, and any emergent or urgent results are addressed in a timely manner for your best interest. If you do not hear from our office in 1 week, please contact us.   We also encourage the use of MyChart, which contains your medical information for your review as well. If you are not enrolled in this feature, an access code is on this after visit summary for your convenience. Thank you for allowing [place] to be involved in your care.  It was great to see you today!  I hope you have a great rest of your Fall!    Sonya Kline, D.O. Gastroenterology and Hepatology [facility]

## 2021-01-13 DIAGNOSIS — E1165 Type 2 diabetes mellitus with hyperglycemia: Secondary | ICD-10-CM | POA: Diagnosis not present

## 2021-01-13 DIAGNOSIS — I10 Essential (primary) hypertension: Secondary | ICD-10-CM | POA: Diagnosis not present

## 2021-01-13 DIAGNOSIS — E559 Vitamin D deficiency, unspecified: Secondary | ICD-10-CM | POA: Diagnosis not present

## 2021-01-14 LAB — T4, FREE: Free T4: 0.99 ng/dL (ref 0.82–1.77)

## 2021-01-14 LAB — LIPID PANEL
Chol/HDL Ratio: 4 ratio (ref 0.0–4.4)
Cholesterol, Total: 165 mg/dL (ref 100–199)
HDL: 41 mg/dL (ref >39)
LDL Chol Calc (NIH): 107 mg/dL — ABNORMAL HIGH (ref 0–99)
Triglycerides: 90 mg/dL (ref 0–149)
VLDL Cholesterol Cal: 17 mg/dL (ref 5–40)

## 2021-01-14 LAB — COMPREHENSIVE METABOLIC PANEL
ALT: 18 IU/L (ref 0–32)
AST: 17 IU/L (ref 0–40)
Albumin/Globulin Ratio: 1.1 — ABNORMAL LOW (ref 1.2–2.2)
Albumin: 3.7 g/dL — ABNORMAL LOW (ref 3.8–4.9)
Alkaline Phosphatase: 81 IU/L (ref 44–121)
BUN/Creatinine Ratio: 23 (ref 9–23)
BUN: 17 mg/dL (ref 6–24)
Bilirubin Total: 0.2 mg/dL (ref 0.0–1.2)
CO2: 28 mmol/L (ref 20–29)
Calcium: 8.9 mg/dL (ref 8.7–10.2)
Chloride: 102 mmol/L (ref 96–106)
Creatinine, Ser: 0.73 mg/dL (ref 0.57–1.00)
Globulin, Total: 3.3 g/dL (ref 1.5–4.5)
Glucose: 98 mg/dL (ref 70–99)
Potassium: 3.7 mmol/L (ref 3.5–5.2)
Sodium: 144 mmol/L (ref 134–144)
Total Protein: 7 g/dL (ref 6.0–8.5)
eGFR: 99 mL/min/{1.73_m2} (ref >59)

## 2021-01-14 LAB — VITAMIN D 25 HYDROXY (VIT D DEFICIENCY, FRACTURES): Vit D, 25-Hydroxy: 15.6 ng/mL — ABNORMAL LOW (ref 30.0–100.0)

## 2021-01-14 LAB — TSH: TSH: 1.37 u[IU]/mL (ref 0.450–4.500)

## 2021-01-24 ENCOUNTER — Ambulatory Visit (INDEPENDENT_AMBULATORY_CARE_PROVIDER_SITE_OTHER): Payer: BC Managed Care – PPO | Admitting: Nurse Practitioner

## 2021-01-24 ENCOUNTER — Telehealth: Payer: Self-pay | Admitting: Internal Medicine

## 2021-01-24 ENCOUNTER — Encounter: Payer: Self-pay | Admitting: Nurse Practitioner

## 2021-01-24 ENCOUNTER — Other Ambulatory Visit: Payer: Self-pay

## 2021-01-24 VITALS — BP 116/74 | HR 98 | Ht 62.0 in | Wt 214.6 lb

## 2021-01-24 DIAGNOSIS — E1165 Type 2 diabetes mellitus with hyperglycemia: Secondary | ICD-10-CM | POA: Diagnosis not present

## 2021-01-24 DIAGNOSIS — I10 Essential (primary) hypertension: Secondary | ICD-10-CM | POA: Diagnosis not present

## 2021-01-24 LAB — POCT GLYCOSYLATED HEMOGLOBIN (HGB A1C): HbA1c, POC (controlled diabetic range): 8.4 % — AB (ref 0.0–7.0)

## 2021-01-24 MED ORDER — LANTUS SOLOSTAR 100 UNIT/ML ~~LOC~~ SOPN: 50.0000 [IU] | PEN_INJECTOR | Freq: Every day | SUBCUTANEOUS | 3 refills | Status: DC

## 2021-01-24 MED ORDER — VITAMIN D (ERGOCALCIFEROL) 1.25 MG (50000 UNIT) PO CAPS: 50000.0000 [IU] | ORAL_CAPSULE | ORAL | 0 refills | Status: DC

## 2021-01-24 NOTE — Progress Notes (Signed)
01/24/2021, 10:58 AM  Endocrinology follow-up note   Subjective:    Patient ID: Sonya Kline, female    DOB: 1970/10/09.  Sonya Kline is being seen in follow-up after she was seen in consultation for management of currently uncontrolled symptomatic diabetes requested by  Asencion Noble, MD.   Past Medical History:  Diagnosis Date   Diabetes mellitus, type II ([place])    Diverticulosis    HTN (hypertension)     Past Surgical History:  Procedure Laterality Date   ABDOMINAL HYSTERECTOMY     CESAREAN SECTION     x 2   COLONOSCOPY N/A 08/22/2019   Procedure: COLONOSCOPY;  Surgeon: Daneil Dolin, MD;  [facility];  Service: Endoscopy;  Laterality: N/A;  9:30   POLYPECTOMY  08/22/2019   Procedure: POLYPECTOMY;  Surgeon: Daneil Dolin, MD;  [facility];  Service: Endoscopy;;    Social History   Socioeconomic History   Marital status: Married    Spouse name: Not on file   Number of children: Not on file   Years of education: college   Highest education level: Not on file  Occupational History   Not on file  Tobacco Use   Smoking status: Never   Smokeless tobacco: Never  Vaping Use   Vaping Use: Never used  Substance and Sexual Activity   Alcohol use: No   Drug use: No   Sexual activity: Yes    Birth control/protection: None, Surgical  Other Topics Concern   Not on file  Social History Narrative   Not on file   Social Determinants of Health   Financial Resource Strain: Not on file  Food Insecurity: Not on file  Transportation Needs: Not on file  Physical Activity: Not on file  Stress: Not on file  Social Connections: Not on file    Family History  Problem Relation Age of Onset   Diabetes Mother    Hypertension Mother    Hypertension Father     Outpatient Encounter Medications as of 01/24/2021  Medication Sig   amLODipine (NORVASC) 10 MG tablet Take 10 mg  by mouth daily.    BD PEN NEEDLE NANO 2ND GEN 32G X 4 MM MISC SMARTSIG:Pre-Filled Pen Syringe Injection Daily   glipiZIDE (GLUCOTROL XL) 5 MG 24 hr tablet Take 1 tablet (5 mg total) by mouth daily with breakfast.   meloxicam (MOBIC) 7.5 MG tablet Take 1 tablet (7.5 mg total) by mouth daily.   rosuvastatin (CRESTOR) 5 MG tablet TAKE 1 TABLET(5 MG) BY MOUTH DAILY   Vitamin D, Ergocalciferol, (DRISDOL) 1.25 MG (50000 UNIT) CAPS capsule Take 1 capsule (50,000 Units total) by mouth every 7 (seven) days.   [DISCONTINUED] insulin glargine (LANTUS SOLOSTAR) 100 UNIT/ML Solostar Pen Inject 40 Units into the skin at bedtime.   insulin glargine (LANTUS SOLOSTAR) 100 UNIT/ML Solostar Pen Inject 50 Units into the skin at bedtime.   No facility-administered encounter medications on file as of 01/24/2021.    ALLERGIES: Allergies  Allergen Reactions   Contrast Media [Iodinated Diagnostic Agents] Hives   Prednisone Rash    VACCINATION STATUS: Immunization History  Administered  Date(s) Administered   R[place]ies, IM 03/17/2013, 03/20/2013, 03/27/2013, 04/11/2013   Tdap 03/17/2013    Diabetes She presents for her follow-up diabetic visit. She has type 2 diabetes mellitus. Onset time: She was diagnosed at approximate age of 6 years. Her disease course has been improving. There are no hypoglycemic associated symptoms. Pertinent negatives for hypoglycemia include no confusion, headaches, pallor or seizures. Pertinent negatives for diabetes include no chest pain, no fatigue, no polydipsia, no polyphagia and no polyuria. There are no hypoglycemic complications. Symptoms are stable. There are no diabetic complications. Risk factors for coronary artery disease include diabetes mellitus, hypertension, obesity and dyslipidemia. Current diabetic treatment includes insulin injections and oral agent (monotherapy). She is compliant with treatment most of the time. Her weight is stable. She is following a generally healthy  diet. When asked about meal planning, she reported none. She has not had a previous visit with a dietitian. She rarely participates in exercise. Her home blood glucose trend is fluctuating minimally. Her breakfast blood glucose range is generally 140-180 mg/dl. Her bedtime blood glucose range is generally >200 mg/dl. (She presents today with her logs, no meter, showing above target fasting and postprandial glycemic profile.  Her POCT A1c today is 8.4%, decreasing from last visit of 9.5%.  She denies any significant hypoglycemia. She does report increased amount of stress lately.) An ACE inhibitor/angiotensin II receptor blocker is not being taken. She does not see a podiatrist.Eye exam is current.  Hypertension This is a chronic problem. The current episode started more than 1 year ago. The problem has been resolved since onset. The problem is controlled. Pertinent negatives include no chest pain, headaches, palpitations or shortness of breath. There are no associated agents to hypertension. Risk factors for coronary artery disease include diabetes mellitus, family history, obesity, sedentary lifestyle and dyslipidemia. Past treatments include calcium channel blockers. The current treatment provides mild improvement. Compliance problems include diet and exercise.   Hyperlipidemia This is a chronic problem. The current episode started more than 1 year ago. The problem is uncontrolled. Recent lipid tests were reviewed and are high. Exacerbating diseases include diabetes. There are no known factors aggravating her hyperlipidemia. Pertinent negatives include no chest pain or shortness of breath. Current antihyperlipidemic treatment includes statins. The current treatment provides mild improvement of lipids. Compliance problems include adherence to diet and adherence to exercise.  Risk factors for coronary artery disease include diabetes mellitus, dyslipidemia, hypertension, obesity and a sedentary lifestyle.    Review of systems  Constitutional: + Minimally fluctuating body weight,  current Body mass index is 39.25 kg/m. , no fatigue, no subjective hyperthermia, no subjective hypothermia Eyes: no blurry vision, no xerophthalmia ENT: no sore throat, no nodules palpated in throat, no dysphagia/odynophagia, no hoarseness Cardiovascular: no chest pain, no shortness of breath, no palpitations, no leg swelling Respiratory: no cough, no shortness of breath Gastrointestinal: no nausea/vomiting/diarrhea Musculoskeletal: no muscle/joint aches Skin: no rashes, no hyperemia Neurological: no tremors, no numbness, no tingling, no dizziness Psychiatric: no depression, no anxiety   Objective:    BP 116/74   Pulse 98   Ht 5\' 2"  (1.575 m)   Wt 214 lb 9.6 oz (97.3 kg)   LMP 11/14/2011   BMI 39.25 kg/m    Wt Readings from Last 3 Encounters:  01/24/21 214 lb 9.6 oz (97.3 kg)  01/12/21 215 lb 9.6 oz (97.8 kg)  12/23/20 213 lb (96.6 kg)    BP Readings from Last 3 Encounters:  01/24/21 116/74  01/12/21 104/67  12/23/20 129/76     Physical Exam- Limited  Constitutional:  Body mass index is 39.25 kg/m. , not in acute distress, normal state of mind Eyes:  EOMI, no exophthalmos Neck: Supple Cardiovascular: RRR, no murmurs, rubs, or gallops, no edema Respiratory: Adequate breathing efforts, no crackles, rales, rhonchi, or wheezing Musculoskeletal: no gross deformities, strength intact in all four extremities, no gross restriction of joint movements Skin:  no rashes, no hyperemia Neurological: no tremor with outstretched hands     CMP ( most recent) CMP     Component Value Date/Time   NA 144 01/13/2021 0745   K 3.7 01/13/2021 0745   CL 102 01/13/2021 0745   CO2 28 01/13/2021 0745   GLUCOSE 98 01/13/2021 0745   GLUCOSE 110 (H) 04/25/2018 0433   BUN 17 01/13/2021 0745   CREATININE 0.73 01/13/2021 0745   CALCIUM 8.9 01/13/2021 0745   PROT 7.0 01/13/2021 0745   ALBUMIN 3.7 (L) 01/13/2021  0745   AST 17 01/13/2021 0745   ALT 18 01/13/2021 0745   ALKPHOS 81 01/13/2021 0745   BILITOT 0.2 01/13/2021 0745   GFRNONAA 88 01/21/2020 0737   GFRAA 102 01/21/2020 0737     Diabetic Labs (most recent): Lab Results  Component Value Date   HGBA1C 8.4 (A) 01/24/2021   HGBA1C 9.5 (A) 10/20/2020   HGBA1C 7.9 (A) 05/31/2020        Assessment & Plan:   1) Uncontrolled type 2 diabetes mellitus with hyperglycemia (H[facility])  - Sonya Kline has currently uncontrolled symptomatic type 2 DM since 50 years of age.  Recent labs reviewed.   She presents today with her logs, no meter, showing above target fasting and postprandial glycemic profile.  Her POCT A1c today is 8.4%, decreasing from last visit of 9.5%.  She denies any significant hypoglycemia. She does report increased amount of stress lately.  - I had a long discussion with her about the progressive nature of diabetes and the pathology behind its complications. -She does not report gross complications of diabetes, however she remains at a high risk for more acute and chronic complications which include CAD, CVA, CKD, retinopathy, and neuropathy. These are all discussed in detail with her.  - Nutritional counseling repeated at each appointment due to patients tendency to fall back in to old habits.  - The patient admits there is a room for improvement in their diet and drink choices. -  Suggestion is made for the patient to avoid simple carbohydrates from their diet including Cakes, Sweet Desserts / Pastries, Ice Cream, Soda (diet and regular), Sweet Tea, Candies, Chips, Cookies, Sweet Pastries, Store Bought Juices, Alcohol in Excess of 1-2 drinks a day, Artificial Sweeteners, Coffee Creamer, and "Sugar-free" Products. This will help patient to have stable blood glucose profile and potentially avoid unintended weight gain.   - I encouraged the patient to switch to unprocessed or minimally processed complex starch and increased protein  intake (animal or plant source), fruits, and vegetables.   - Patient is advised to stick to a routine mealtimes to eat 3 meals a day and avoid unnecessary snacks (to snack only to correct hypoglycemia).  - she is following with Jearld Fenton, RDN, CDE for diabetes education.  - I have approached her with the following individualized plan to manage  her diabetes and patient agrees:   -In light of her prevailing glycemic burden, she will continue to need insulin treatment in order for her to achieve and maintain control of diabetes to target.   -  Based on her fasting hyperglycemia, she is advised to increase her Lantus to 50 units SQ nightly.  She can continue her Glipizide 5 mg XL daily with breakfast.      - she is encouraged to continue monitoring blood glucose twice daily, before breakfast and before bed, and to call the clinic if she has readings less than 70 or greater than 300 for 3 tests in a row.  - she will be considered for incretin therapy as appropriate next visit.  - Specific targets for  A1c;  LDL, HDL,  and Triglycerides were discussed with the patient.  2) Blood Pressure /Hypertension:  Her blood pressure is controlled to target.  She is advised to continue Norvasc 10 mg po daily.    3) Lipids/Hyperlipidemia:  Her most recent lipid panel from 01/13/21 shows uncontrolled LDL at 107 (improving).   She is advised to continue Crestor 5 mg po daily before bed.  Side effects and precautions discussed with her.    4)  Weight/Diet:  Her Body mass index is 39.25 kg/m.  -   clearly complicating her diabetes care.   she is a candidate for weight loss. I discussed with her the fact that loss of 5 - 10% of her  current body weight will have the most impact on her diabetes management.  Exercise, and detailed carbohydrates information provided  -  detailed on discharge instructions.  5) Vitamin D Deficiency: Her recent vitamin D level from 01/13/21 was low at 15.6.  She is not currently on  any supplementation.  I discussed and initiated replenishment with Ergocalciferol 50000 units po weekly x 12 weeks.  6) Chronic Care/Health Maintenance: -she is not on ACEI/ARB is on Statin medications and is encouraged to initiate and continue to follow up with Ophthalmology, Dentist,  Podiatrist at least yearly or according to recommendations, and advised to stay away from smoking. I have recommended yearly flu vaccine and pneumonia vaccine at least every 5 years; moderate intensity exercise for up to 150 minutes weekly; and  sleep for at least 7 hours a day.  - she is advised to maintain close follow up with Asencion Noble, MD for primary care needs, as well as her other providers for optimal and coordinated care.      I spent 30 minutes in the care of the patient today including review of labs from [place], Lipids, Thyroid Function, Hematology (current and previous including abstractions from other facilities); face-to-face time discussing  her blood glucose readings/logs, discussing hypoglycemia and hyperglycemia episodes and symptoms, medications doses, her options of short and long term treatment based on the latest standards of care / guidelines;  discussion about incorporating lifestyle medicine;  and documenting the encounter.    Please refer to Patient Instructions for Blood Glucose Monitoring and Insulin/Medications Dosing Guide"  in media tab for additional information. Please  also refer to " Patient Self Inventory" in the Media  tab for reviewed elements of pertinent patient history.  [name] participated in the discussions, expressed understanding, and voiced agreement with the above plans.  All questions were answered to her satisfaction. she is encouraged to contact clinic should she have any questions or concerns prior to her return visit.    Follow up plan: - Return in about 3 months (around 04/24/2021) for Diabetes F/U with A1c in office, No previsit labs, Bring meter and  logs.  Rayetta Pigg, [facility] [address] [place], [place] [address] Phone: [phone] Fax: [phone]  01/24/2021, 10:58 AM

## 2021-01-24 NOTE — Telephone Encounter (Signed)
See other phone note for today. 

## 2021-01-24 NOTE — Telephone Encounter (Signed)
Pt called office, EGD/DIL for tomorrow rescheduled to 02/08/21 at 10:00am.  Pt called back few minutes later and said she already has a dentist appt morning of 02/08/21 to have a tooth pulled. Advised pt wouldn't be able to have procedure the same day as having tooth pulled. She wants to wait until January. Advised her we will call back to reschedule when January schedule is available.  Endo scheduler informed to cancel procedure for tomorrow.

## 2021-01-24 NOTE — Patient Instructions (Signed)

## 2021-01-24 NOTE — Telephone Encounter (Signed)
Pt returning call to reschedule procedure.

## 2021-01-24 NOTE — Telephone Encounter (Signed)
PATIENT NEEDS TO RESCHEDULE HER PROCEDURE  

## 2021-01-24 NOTE — Telephone Encounter (Signed)
Tried to call pt, LMOVM for return call. 

## 2021-01-25 ENCOUNTER — Ambulatory Visit (HOSPITAL_COMMUNITY): Admission: RE | Admit: 2021-01-25 | Payer: BC Managed Care – PPO | Source: Home / Self Care

## 2021-01-25 ENCOUNTER — Encounter (HOSPITAL_COMMUNITY): Admission: RE | Payer: Self-pay | Source: Home / Self Care

## 2021-01-25 SURGERY — ESOPHAGOGASTRODUODENOSCOPY (EGD) WITH PROPOFOL
Anesthesia: Monitor Anesthesia Care

## 2021-01-27 ENCOUNTER — Other Ambulatory Visit: Payer: Self-pay | Admitting: Nurse Practitioner

## 2021-02-01 ENCOUNTER — Emergency Department (HOSPITAL_COMMUNITY)
Admission: EM | Admit: 2021-02-01 | Discharge: 2021-02-02 | Disposition: A | Discharge status: Home / Self Care | Payer: BC Managed Care – PPO | Attending: Emergency Medicine | Admitting: Emergency Medicine

## 2021-02-01 ENCOUNTER — Other Ambulatory Visit: Payer: Self-pay

## 2021-02-01 ENCOUNTER — Encounter (HOSPITAL_COMMUNITY): Payer: Self-pay | Admitting: *Deleted

## 2021-02-01 ENCOUNTER — Emergency Department (HOSPITAL_COMMUNITY): Payer: BC Managed Care – PPO

## 2021-02-01 DIAGNOSIS — Z794 Long term (current) use of insulin: Secondary | ICD-10-CM | POA: Insufficient documentation

## 2021-02-01 DIAGNOSIS — R109 Unspecified abdominal pain: Secondary | ICD-10-CM | POA: Diagnosis not present

## 2021-02-01 DIAGNOSIS — N23 Unspecified renal colic: Secondary | ICD-10-CM | POA: Diagnosis not present

## 2021-02-01 DIAGNOSIS — I10 Essential (primary) hypertension: Secondary | ICD-10-CM | POA: Insufficient documentation

## 2021-02-01 DIAGNOSIS — E1165 Type 2 diabetes mellitus with hyperglycemia: Secondary | ICD-10-CM | POA: Diagnosis not present

## 2021-02-01 DIAGNOSIS — N202 Calculus of kidney with calculus of ureter: Secondary | ICD-10-CM | POA: Diagnosis not present

## 2021-02-01 DIAGNOSIS — Z7984 Long term (current) use of oral hypoglycemic drugs: Secondary | ICD-10-CM | POA: Insufficient documentation

## 2021-02-01 DIAGNOSIS — K5730 Diverticulosis of large intestine without perforation or abscess without bleeding: Secondary | ICD-10-CM | POA: Diagnosis not present

## 2021-02-01 DIAGNOSIS — Z90710 Acquired absence of both cervix and uterus: Secondary | ICD-10-CM | POA: Diagnosis not present

## 2021-02-01 DIAGNOSIS — N1330 Unspecified hydronephrosis: Secondary | ICD-10-CM | POA: Diagnosis not present

## 2021-02-01 LAB — URINALYSIS, ROUTINE W REFLEX MICROSCOPIC
Bilirubin Urine: NEGATIVE
Glucose, UA: NEGATIVE mg/dL
Ketones, ur: NEGATIVE mg/dL
Leukocytes,Ua: NEGATIVE
Nitrite: NEGATIVE
Protein, ur: NEGATIVE mg/dL
Specific Gravity, Urine: 1.015 (ref 1.005–1.030)
pH: 7 (ref 5.0–8.0)

## 2021-02-01 LAB — URINALYSIS, MICROSCOPIC (REFLEX): RBC / HPF: >50 RBC/hpf (ref 0–5)

## 2021-02-01 MED ORDER — HYDROMORPHONE HCL 1 MG/ML IJ SOLN
1.0000 mg | Freq: Once | INTRAMUSCULAR | Status: AC
  Administered 2021-02-01: 1 mg via INTRAVENOUS
  Filled 2021-02-01: qty 1

## 2021-02-01 MED ORDER — ONDANSETRON HCL 4 MG/2ML IJ SOLN
4.0000 mg | Freq: Once | INTRAMUSCULAR | Status: AC
  Administered 2021-02-01: 23:00:00 4 mg via INTRAVENOUS
  Filled 2021-02-01: qty 2

## 2021-02-01 NOTE — ED Triage Notes (Signed)
Pt with right flank pain that started tonight. Pt with hx of kidney stones. + nausea, denies any emesis.

## 2021-02-01 NOTE — ED Notes (Signed)
Patient transported to CT 

## 2021-02-01 NOTE — ED Provider Notes (Signed)
[facility] EMERGENCY DEPARTMENT Provider Note   CSN: [id number] Arrival date & time: 02/01/21  2300     History Chief Complaint  Patient presents with   Flank Pain    Sonya Kline is a 50 y.o. female.  Patient presents to the emergency department for evaluation of flank pain.  Patient reports sudden onset right flank pain tonight.  Pain is mostly in the lower back but does radiate around the right side.  Patient has had nausea, no vomiting.  She has not noticed any urinary frequency or dysuria, no hematuria.  She does have a history of kidney stones, is not sure if this feels similar.  It has been several years since she has had one.      Past Medical History:  Diagnosis Date   Diabetes mellitus, type II ([place])    Diverticulosis    HTN (hypertension)     Patient Active Problem List   Diagnosis Date Noted   Uncontrolled type 2 diabetes mellitus with hyperglycemia ([place]) 10/29/2019   Essential hypertension, benign 10/29/2019   Pain of upper abdomen    Ileus ([place]) 04/23/2018   Labral tear of hip, degenerative 10/16/2013   Ankle pain, left 05/03/2011   DIVERTICULITIS, HX OF 03/09/2009    Past Surgical History:  Procedure Laterality Date   ABDOMINAL HYSTERECTOMY     CESAREAN SECTION     x 2   COLONOSCOPY N/A 08/22/2019   Procedure: COLONOSCOPY;  Surgeon: Daneil Dolin, MD;  Location: [facility];  Service: Endoscopy;  Laterality: N/A;  9:30   POLYPECTOMY  08/22/2019   Procedure: POLYPECTOMY;  Surgeon: Daneil Dolin, MD;  Location: [facility];  Service: Endoscopy;;     OB History     Gravida  4   Para  2   Term  2   Preterm      AB  2   Living  2      SAB  2   IAB      Ectopic      Multiple      Live Births              Family History  Problem Relation Age of Onset   Diabetes Mother    Hypertension Mother    Hypertension Father     Social History   Tobacco Use   Smoking status: Never   Smokeless tobacco: Never  Vaping Use    Vaping Use: Never used  Substance Use Topics   Alcohol use: No   Drug use: No    Home Medications Prior to Admission medications   Medication Sig Start Date End Date Taking? Authorizing Provider  oxyCODONE-acetaminophen (PERCOCET) 5-325 MG tablet Take 1-2 tablets by mouth every 4 (four) hours as needed. 02/02/21  Yes , Gwenyth Allegra, MD  oxyCODONE-acetaminophen (PERCOCET) 5-325 MG tablet Take 1-2 tablets by mouth every 4 (four) hours as needed. 02/02/21  Yes , Gwenyth Allegra, MD  tamsulosin (FLOMAX) 0.4 MG CAPS capsule Take 1 capsule (0.4 mg total) by mouth daily. 02/02/21  Yes , Gwenyth Allegra, MD  amLODipine (NORVASC) 10 MG tablet Take 10 mg by mouth daily.     [provider]  BD PEN NEEDLE NANO 2ND GEN 32G X 4 MM MISC SMARTSIG:Pre-Filled Pen Syringe Injection Daily 10/28/19   [provider]  glipiZIDE (GLUCOTROL XL) 5 MG 24 hr tablet TAKE 1 TABLET(5 MG) BY MOUTH DAILY WITH BREAKFAST 01/27/21   Brita Romp, NP  insulin glargine (  LANTUS SOLOSTAR) 100 UNIT/ML Solostar Pen Inject 50 Units into the skin at bedtime. 01/24/21   Brita Romp, NP  meloxicam (MOBIC) 7.5 MG tablet Take 1 tablet (7.5 mg total) by mouth daily. 08/04/20   Carole Civil, MD  rosuvastatin (CRESTOR) 5 MG tablet TAKE 1 TABLET(5 MG) BY MOUTH DAILY 12/10/20   Brita Romp, NP  Vitamin D, Ergocalciferol, (DRISDOL) 1.25 MG (50000 UNIT) CAPS capsule Take 1 capsule (50,000 Units total) by mouth every 7 (seven) days. 01/24/21   Brita Romp, NP    Allergies    Contrast media [iodinated diagnostic agents] and Prednisone  Review of Systems   Review of Systems  Gastrointestinal:  Positive for nausea.  Genitourinary:  Positive for flank pain.  All other systems reviewed and are negative.  Physical Exam Updated Vital Signs BP 115/69    Pulse 94    Temp 98.2 F (36.8 C) (Oral)    Resp 18    Ht 5\' 2"  (1.575 m)    Wt 97.1 kg    LMP 11/14/2011    SpO2 93%    BMI  39.14 kg/m   Physical Exam Vitals and nursing note reviewed.  Constitutional:      General: She is not in acute distress.    Appearance: Normal appearance. She is well-developed.  HENT:     Head: Normocephalic and atraumatic.     Right Ear: Hearing normal.     Left Ear: Hearing normal.     Nose: Nose normal.  Eyes:     Conjunctiva/sclera: Conjunctivae normal.     Pupils: Pupils are equal, round, and reactive to light.  Cardiovascular:     Rate and Rhythm: Regular rhythm.     Heart sounds: S1 normal and S2 normal. No murmur heard.   No friction rub. No gallop.  Pulmonary:     Effort: Pulmonary effort is normal. No respiratory distress.     Breath sounds: Normal breath sounds.  Chest:     Chest wall: No tenderness.  Abdominal:     General: Bowel sounds are normal.     Palpations: Abdomen is soft.     Tenderness: There is no abdominal tenderness. There is right CVA tenderness. There is no guarding or rebound. Negative signs include Murphy's sign and McBurney's sign.     Hernia: No hernia is present.  Musculoskeletal:        General: Normal range of motion.     Cervical back: Normal range of motion and neck supple.  Skin:    General: Skin is warm and dry.     Findings: No rash.  Neurological:     Mental Status: She is alert and oriented to person, place, and time.     GCS: GCS eye subscore is 4. GCS verbal subscore is 5. GCS motor subscore is 6.     Cranial Nerves: No cranial nerve deficit.     Sensory: No sensory deficit.     Coordination: Coordination normal.  Psychiatric:        Speech: Speech normal.        Behavior: Behavior normal.        Thought Content: Thought content normal.    ED Results / Procedures / Treatments   Labs (all labs ordered are listed, but only abnormal results are displayed) Labs Reviewed  CBC WITH DIFFERENTIAL/PLATELET - Abnormal; Notable for the following components:      Result Value   WBC 21.8 (*)    MCH 25.1 (*)  RDW 16.4 (*)     Neutro Abs 17.7 (*)    Monocytes Absolute 1.5 (*)    Abs Immature Granulocytes 0.11 (*)    All other components within normal limits  COMPREHENSIVE METABOLIC PANEL - Abnormal; Notable for the following components:   Glucose, Bld 239 (*)    BUN 24 (*)    All other components within normal limits  URINALYSIS, ROUTINE W REFLEX MICROSCOPIC - Abnormal; Notable for the following components:   APPearance HAZY (*)    Hgb urine dipstick LARGE (*)    All other components within normal limits  URINALYSIS, MICROSCOPIC (REFLEX) - Abnormal; Notable for the following components:   Bacteria, UA FEW (*)    All other components within normal limits    EKG None  Radiology CT RENAL STONE STUDY  Result Date: 02/01/2021 CLINICAL DATA:  Right flank pain. EXAM: CT ABDOMEN AND PELVIS WITHOUT CONTRAST TECHNIQUE: Multidetector CT imaging of the abdomen and pelvis was performed following the standard protocol without IV contrast. COMPARISON:  April 23, 2018 FINDINGS: Lower chest: No acute abnormality. Hepatobiliary: No focal liver abnormality is seen. No gallstones, gallbladder wall thickening, or biliary dilatation. Pancreas: Unremarkable. No pancreatic ductal dilatation or surrounding inflammatory changes. Spleen: Normal in size without focal abnormality. Adrenals/Urinary Tract: Adrenal glands are unremarkable. Kidneys are normal in size, without focal lesions. A 6 mm nonobstructing renal stone is seen within the mid to lower left kidney. A 2 mm nonobstructing renal stone is seen within the anterior aspect of the mid to lower right kidney. There is a 5 mm obstructing renal stone within the proximal to mid right ureter, with mild to moderate severity right-sided hydronephrosis, hydroureter and perinephric inflammatory fat stranding. Bladder is unremarkable. Stomach/Bowel: Stomach is within normal limits. Appendix appears normal. No evidence of bowel wall thickening, distention, or inflammatory changes. Noninflamed  diverticula are seen throughout the large bowel. Vascular/Lymphatic: No significant vascular findings are present. No enlarged abdominal or pelvic lymph nodes. Reproductive: The uterus is surgically absent. A 5.7 cm x 4.5 cm cyst is seen along the anterior aspect of the left adnexa. Other: No abdominal wall hernia or abnormality. No abdominopelvic ascites. Musculoskeletal: No acute or significant osseous findings. IMPRESSION: 1. 5 mm obstructing renal stone within the proximal to mid right ureter. 2. Bilateral nonobstructing renal calculi. 3. Colonic diverticulosis. 4. 5.7 cm x 4.5 cm left adnexal cyst, likely ovarian in origin. Correlation with pelvic ultrasound is recommended. Electronically Signed   By: Virgina Norfolk M.D.   On: 02/01/2021 23:55    Procedures Procedures   Medications Ordered in ED Medications  ondansetron [facility]) injection 4 mg (4 mg Intravenous Given 02/01/21 2326)  HYDROmorphone (DILAUDID) injection 1 mg (1 mg Intravenous Given 02/01/21 2330)    ED Course  I have reviewed the triage vital signs and the nursing notes.  Pertinent labs & imaging results that were available during my care of the patient were reviewed by me and considered in my medical decision making (see chart for details).    MDM Rules/Calculators/A&P                         Patient presents to the emergency department for evaluation of flank pain.  Patient had sudden onset of right-sided flank pain tonight.  No associated urinary symptoms.  Work-up does reveal a proximal ureteral stone that explains her symptoms.  She feels much improved after a single dose of analgesia.  Will discharge with Percocet, Flomax,  follow-up with urology.  Patient given return instructions.  Urinalysis does not show any signs of infection at this time.    Final Clinical Impression(s) / ED Diagnoses Final diagnoses:  Ureteral colic    Rx / DC Orders ED Discharge Orders          Ordered    Ambulatory referral to  Urology        02/02/21 0048    oxyCODONE-acetaminophen (PERCOCET) 5-325 MG tablet  Every 4 hours PRN        02/02/21 0050    oxyCODONE-acetaminophen (PERCOCET) 5-325 MG tablet  Every 4 hours PRN        02/02/21 0051    tamsulosin (FLOMAX) 0.4 MG CAPS capsule  Daily        02/02/21 0051             Orpah Greek, MD 02/02/21 [phone]

## 2021-02-02 ENCOUNTER — Other Ambulatory Visit: Payer: Self-pay

## 2021-02-02 DIAGNOSIS — N200 Calculus of kidney: Secondary | ICD-10-CM

## 2021-02-02 LAB — CBC WITH DIFFERENTIAL/PLATELET
Abs Immature Granulocytes: 0.11 10*3/uL — ABNORMAL HIGH (ref 0.00–0.07)
Basophils Absolute: 0.1 10*3/uL (ref 0.0–0.1)
Basophils Relative: 0 %
Eosinophils Absolute: 0 10*3/uL (ref 0.0–0.5)
Eosinophils Relative: 0 %
HCT: 40.6 % (ref 36.0–46.0)
Hemoglobin: 12.6 g/dL (ref 12.0–15.0)
Immature Granulocytes: 1 %
Lymphocytes Relative: 11 %
Lymphs Abs: 2.4 10*3/uL (ref 0.7–4.0)
MCH: 25.1 pg — ABNORMAL LOW (ref 26.0–34.0)
MCHC: 31 g/dL (ref 30.0–36.0)
MCV: 81 fL (ref 80.0–100.0)
Monocytes Absolute: 1.5 10*3/uL — ABNORMAL HIGH (ref 0.1–1.0)
Monocytes Relative: 7 %
Neutro Abs: 17.7 10*3/uL — ABNORMAL HIGH (ref 1.7–7.7)
Neutrophils Relative %: 81 %
Platelets: 390 10*3/uL (ref 150–400)
RBC: 5.01 MIL/uL (ref 3.87–5.11)
RDW: 16.4 % — ABNORMAL HIGH (ref 11.5–15.5)
WBC: 21.8 10*3/uL — ABNORMAL HIGH (ref 4.0–10.5)
nRBC: 0 % (ref 0.0–0.2)

## 2021-02-02 LAB — COMPREHENSIVE METABOLIC PANEL
ALT: 17 U/L (ref 0–44)
AST: 20 U/L (ref 15–41)
Albumin: 3.6 g/dL (ref 3.5–5.0)
Alkaline Phosphatase: 74 U/L (ref 38–126)
Anion gap: 9 (ref 5–15)
BUN: 24 mg/dL — ABNORMAL HIGH (ref 6–20)
CO2: 27 mmol/L (ref 22–32)
Calcium: 9.1 mg/dL (ref 8.9–10.3)
Chloride: 101 mmol/L (ref 98–111)
Creatinine, Ser: 0.92 mg/dL (ref 0.44–1.00)
GFR, Estimated: >60 mL/min (ref >60)
Glucose, Bld: 239 mg/dL — ABNORMAL HIGH (ref 70–99)
Potassium: 3.6 mmol/L (ref 3.5–5.1)
Sodium: 137 mmol/L (ref 135–145)
Total Bilirubin: 0.3 mg/dL (ref 0.3–1.2)
Total Protein: 8 g/dL (ref 6.5–8.1)

## 2021-02-02 MED ORDER — OXYCODONE-ACETAMINOPHEN 5-325 MG PO TABS: 1.0000 | ORAL_TABLET | ORAL | 0 refills | Status: DC | PRN

## 2021-02-02 MED ORDER — TAMSULOSIN HCL 0.4 MG PO CAPS
0.4000 mg | ORAL_CAPSULE | Freq: Every day | ORAL | 0 refills | Status: DC
Start: 2021-02-02 — End: 2021-02-15

## 2021-02-03 ENCOUNTER — Ambulatory Visit (HOSPITAL_COMMUNITY)
Admission: RE | Admit: 2021-02-03 | Discharge: 2021-02-03 | Disposition: A | Discharge status: Home / Self Care | Payer: BC Managed Care – PPO | Source: Ambulatory Visit | Attending: Urology | Admitting: Urology

## 2021-02-03 ENCOUNTER — Other Ambulatory Visit: Payer: Self-pay

## 2021-02-03 DIAGNOSIS — N200 Calculus of kidney: Secondary | ICD-10-CM | POA: Insufficient documentation

## 2021-02-03 DIAGNOSIS — R109 Unspecified abdominal pain: Secondary | ICD-10-CM | POA: Diagnosis not present

## 2021-02-03 MED FILL — Oxycodone w/ Acetaminophen Tab 5-325 MG: ORAL | Qty: 6 | Status: AC

## 2021-02-04 ENCOUNTER — Ambulatory Visit: Payer: BC Managed Care – PPO | Admitting: Urology

## 2021-02-09 ENCOUNTER — Ambulatory Visit (HOSPITAL_COMMUNITY)
Admission: RE | Admit: 2021-02-09 | Discharge: 2021-02-09 | Disposition: A | Discharge status: Home / Self Care | Payer: BC Managed Care – PPO | Source: Ambulatory Visit | Attending: Urology | Admitting: Urology

## 2021-02-09 ENCOUNTER — Other Ambulatory Visit: Payer: Self-pay | Admitting: Urology

## 2021-02-09 ENCOUNTER — Other Ambulatory Visit: Payer: Self-pay

## 2021-02-09 ENCOUNTER — Encounter: Payer: Self-pay | Admitting: Urology

## 2021-02-09 ENCOUNTER — Ambulatory Visit (INDEPENDENT_AMBULATORY_CARE_PROVIDER_SITE_OTHER): Payer: BC Managed Care – PPO | Admitting: Urology

## 2021-02-09 VITALS — BP 122/70 | HR 101

## 2021-02-09 DIAGNOSIS — N200 Calculus of kidney: Secondary | ICD-10-CM

## 2021-02-09 DIAGNOSIS — I878 Other specified disorders of veins: Secondary | ICD-10-CM | POA: Diagnosis not present

## 2021-02-09 NOTE — Patient Instructions (Signed)
Lithotripsy Lithotripsy is a treatment that can help break up kidney stones that are too large to pass on their own. This is a nonsurgical procedure that crushes a kidney stone with shock waves. These shock waves pass through your body and focus on the kidney stone. They cause the kidney stone to break up into smaller pieces while it is still in the urinary tract. The smaller pieces of stone can pass more easily out of your body in the urine. Tell a health care provider about: Any allergies you have. All medicines you are taking, including vitamins, herbs, eye drops, creams, and over-the-counter medicines. Any problems you or family members have had with anesthetic medicines. Any blood disorders you have. Any surgeries you have had. Any medical conditions you have. Whether you are pregnant or may be pregnant. What are the risks? Generally, this is a safe procedure. However, problems may occur, including: Infection. Bleeding from the kidney. Bruising of the kidney or skin. Scarring of the kidney, which can lead to: Increased blood pressure. Poor kidney function. Return (recurrence) of kidney stones. Damage to other structures or organs, such as the liver, colon, spleen, or pancreas. Blockage (obstruction) of the tube that carries urine from the kidney to the bladder (ureter). Failure of the kidney stone to break into pieces (fragments). What happens before the procedure? Staying hydrated Follow instructions from your health care provider about hydration, which may include: Up to 2 hours before the procedure - you may continue to drink clear liquids, such as water, clear fruit juice, black coffee, and plain tea. Eating and drinking restrictions Follow instructions from your health care provider about eating and drinking, which may include: 8 hours before the procedure - stop eating heavy meals or foods, such as meat, fried foods, or fatty foods. 6 hours before the procedure - stop eating  light meals or foods, such as toast or cereal. 6 hours before the procedure - stop drinking milk or drinks that contain milk. 2 hours before the procedure - stop drinking clear liquids. Medicines Ask your health care provider about: Changing or stopping your regular medicines. This is especially important if you are taking diabetes medicines or blood thinners. Taking medicines such as aspirin and ibuprofen. These medicines can thin your blood. Do not take these medicines unless your health care provider tells you to take them. Taking over-the-counter medicines, vitamins, herbs, and supplements. Tests You may have tests, such as: Blood tests. Urine tests. Imaging tests, such as a CT scan. General instructions Plan to have someone take you home from the hospital or clinic. If you will be going home right after the procedure, plan to have someone with you for 24 hours. Ask your health care provider what steps will be taken to help prevent infection. These may include washing skin with a germ-killing soap. What happens during the procedure?  An IV will be inserted into one of your veins. You will be given one or more of the following: A medicine to help you relax (sedative). A medicine to make you fall asleep (general anesthetic). A water-filled cushion may be placed behind your kidney or on your abdomen. In some cases, you may be placed in a tub of lukewarm water. Your body will be positioned in a way that makes it easy to target the kidney stone. An X-ray or ultrasound exam will be done to locate your stone. Shock waves will be aimed at the stone. If you are awake, you may feel a tapping sensation as  the shock waves pass through your body. A flexible tube with holes in it (stent) may be placed in the ureter. This will help keep urine flowing from the kidney if the fragments of the stone have been blocking the ureter. The procedure may vary among health care providers and hospitals. What  happens after the procedure? You may have an X-ray to see whether the procedure was able to break up the kidney stone and how much of the stone has passed. If large stone fragments remain after treatment, you may need to have a second procedure at a later time. Your blood pressure, heart rate, breathing rate, and blood oxygen level will be monitored until you leave the hospital or clinic. You may be given antibiotics or pain medicine as needed. If a stent was placed in your ureter during surgery, it may stay in place for a few weeks. You may need to strain your urine to collect pieces of the kidney stone for testing. You will need to drink plenty of water. If you were given a sedative during the procedure, it can affect you for several hours. Do not drive or operate machinery until your health care provider says that it is safe. Summary Lithotripsy is a treatment that can help break up kidney stones that are too large to pass on their own. Lithotripsy is a nonsurgical procedure that crushes a kidney stone with shock waves. Generally, this is a safe procedure. However, problems may occur, including damage to the kidney or other organs, infection, or obstruction of the tube that carries urine from the kidney to the bladder (ureter). You may have a stent placed in your ureter to help drain your urine. This stent may stay in place for a few weeks. After the procedure, you will need to drink plenty of water. You may be asked to strain your urine to collect pieces of the kidney stone for testing. This information is not intended to replace advice given to you by your health care provider. Make sure you discuss any questions you have with your health care provider. Document Revised: 11/12/2018 Document Reviewed: 11/13/2018 Elsevier Patient Education  [employer].

## 2021-02-09 NOTE — Progress Notes (Signed)

## 2021-02-09 NOTE — Progress Notes (Signed)
Surgical Physician Order Form [facility]  * Scheduling expectation :  02/15/2021  *Length of Case: 60 minutes  *MD Preforming Case: Michaelle Birks, MD  *Assistant Needed: no  *Facility Preference: [facility]  *Clearance needed: no  *Anticoagulation Instructions: Hold all anticoagulants  *Aspirin Instructions: Hold Aspirin  -Admit type: OUTpatient  -Anesthesia: MAC  -Use Standing Orders: ESWL  *Diagnosis: Right Nephrolithiasis  *Procedure: right  ESWL  Additional orders: N/A  -Equipment:  NA -VTE Prophylaxis Standing Order SCDs       Other:   -Standing Lab Orders Per Anesthesia    Lab other: KUB day of Procedure  -Standing Test orders EKG/Chest x-ray per Anesthesia       Test other:   - Medications:   NA  -Other orders:  N/A  *Post-op visit Date/Instructions:  [facility] 2 weeks

## 2021-02-09 NOTE — H&P (View-Only) (Signed)
02/09/2021 10:49 AM   [name] 1970/05/15 [id number]  Referring provider: Asencion Noble, MD [address] [place],  [place] [address]  nephrolithiasis   HPI: Sonya Kline is a 50yo here for evaluation of nephrolithiasis. Starting 10 days ago she developed severe right flank pain and presented to the ER. The pain was sharp, intermittent severe and nonraditing. Pain has since resolved. This is here 3rd stone event. She was diagnosed with a 34mm right proximal calculus in the ER.   She has not passed her calculus. No flank pain currently. No nausea or vomiting. No other complaints today. KUb from today shows an unchanged rigth proximal ureteral calculus.   PMH: Past Medical History:  Diagnosis Date   Diabetes mellitus, type II ([place])    Diverticulosis    HTN (hypertension)     Surgical History: Past Surgical History:  Procedure Laterality Date   ABDOMINAL HYSTERECTOMY     CESAREAN SECTION     x 2   COLONOSCOPY N/A 08/22/2019   Procedure: COLONOSCOPY;  Surgeon: Daneil Dolin, MD;  [facility];  Service: Endoscopy;  Laterality: N/A;  9:30   POLYPECTOMY  08/22/2019   Procedure: POLYPECTOMY;  Surgeon: Daneil Dolin, MD;  [facility];  Service: Endoscopy;;    Home Medications:  Allergies as of 02/09/2021       Reactions   Contrast Media [iodinated Contrast Media] Hives   Prednisone Rash        Medication List        Accurate as of February 09, 2021 10:49 AM. If you have any questions, ask your nurse or doctor.          amLODipine 10 MG tablet Commonly known as: NORVASC Take 10 mg by mouth daily.   BD Pen Needle Nano 2nd Gen 32G X 4 MM Misc Generic drug: Insulin Pen Needle SMARTSIG:Pre-Filled Pen Syringe Injection Daily   glipiZIDE 5 MG 24 hr tablet Commonly known as: GLUCOTROL XL TAKE 1 TABLET(5 MG) BY MOUTH DAILY WITH BREAKFAST   Lantus SoloStar 100 UNIT/ML Solostar Pen Generic drug: insulin glargine Inject 50 Units into the  skin at bedtime.   meloxicam 7.5 MG tablet Commonly known as: Mobic Take 1 tablet (7.5 mg total) by mouth daily.   oxyCODONE-acetaminophen 5-325 MG tablet Commonly known as: Percocet Take 1-2 tablets by mouth every 4 (four) hours as needed.   oxyCODONE-acetaminophen 5-325 MG tablet Commonly known as: Percocet Take 1-2 tablets by mouth every 4 (four) hours as needed.   rosuvastatin 5 MG tablet Commonly known as: CRESTOR TAKE 1 TABLET(5 MG) BY MOUTH DAILY   tamsulosin 0.4 MG Caps capsule Commonly known as: Flomax Take 1 capsule (0.4 mg total) by mouth daily.   Vitamin D (Ergocalciferol) 1.25 MG (50000 UNIT) Caps capsule Commonly known as: DRISDOL Take 1 capsule (50,000 Units total) by mouth every 7 (seven) days.        Allergies:  Allergies  Allergen Reactions   Contrast Media [Iodinated Contrast Media] Hives   Prednisone Rash    Family History: Family History  Problem Relation Age of Onset   Diabetes Mother    Hypertension Mother    Hypertension Father     Social History:  reports that she has never smoked. She has never used smokeless tobacco. She reports that she does not drink alcohol and does not use drugs.  ROS: All other review of systems were reviewed and are negative except what is noted above in HPI  Physical Exam: BP 122/70    Pulse (!) 101    LMP 11/14/2011   Constitutional:  Alert and oriented, No acute distress. HEENT: [place] AT, moist mucus membranes.  Trachea midline, no masses. Cardiovascular: No clubbing, cyanosis, or edema. Respiratory: Normal respiratory effort, no increased work of breathing. GI: Abdomen is soft, nontender, nondistended, no abdominal masses GU: No CVA tenderness.  Lymph: No cervical or inguinal lymphadenopathy. Skin: No rashes, bruises or suspicious lesions. Neurologic: Grossly intact, no focal deficits, moving all 4 extremities. Psychiatric: Normal mood and affect.  Laboratory Data: Lab Results  Component Value Date    WBC 21.8 (H) 02/01/2021   HGB 12.6 02/01/2021   HCT 40.6 02/01/2021   MCV 81.0 02/01/2021   PLT 390 02/01/2021    Lab Results  Component Value Date   CREATININE 0.92 02/01/2021    No results found for: PSA  No results found for: TESTOSTERONE  Lab Results  Component Value Date   HGBA1C 8.4 (A) 01/24/2021    Urinalysis    Component Value Date/Time   COLORURINE YELLOW 02/01/2021 2318   APPEARANCEUR HAZY (A) 02/01/2021 2318   LABSPEC 1.015 02/01/2021 2318   PHURINE 7.0 02/01/2021 2318   GLUCOSEU NEGATIVE 02/01/2021 2318   HGBUR LARGE (A) 02/01/2021 2318   BILIRUBINUR NEGATIVE 02/01/2021 2318   KETONESUR NEGATIVE 02/01/2021 2318   PROTEINUR NEGATIVE 02/01/2021 2318   UROBILINOGEN 0.2 10/17/2008 1602   NITRITE NEGATIVE 02/01/2021 2318   LEUKOCYTESUR NEGATIVE 02/01/2021 2318    Lab Results  Component Value Date   BACTERIA FEW (A) 02/01/2021    Pertinent Imaging: KUB today: images reviewed and discussed with the patient Results for orders placed in visit on 02/02/21  DG Abd 1 View  Narrative CLINICAL DATA:  Right flank pain for couple of days. Evaluate for kidney stone.  EXAM: ABDOMEN - 1 VIEW  COMPARISON:  CT scan February 01, 2021  FINDINGS: A 6 mm stone is seen in the mid right ureter overlying the superolateral aspect of the L4 transverse process. The kidneys are partially obscured by bowel contents. No other stones seen in the right kidney. At least 2 stones are seen in the left kidney. Calcifications in the pelvis are likely phleboliths. No other abnormalities.  IMPRESSION: 1. 6 mm stone in the mid right ureter as above, in similar position when compared to the CT scan from February 01, 2021. 2. Both kidneys are partially obscured by bowel contents. At least 2 stones are seen in the left kidney. No other stones are seen in the right kidney.   Electronically Signed By: Dorise Bullion III M.D. On: 02/03/2021 17:43  No results found for this  or any previous visit.  No results found for this or any previous visit.  No results found for this or any previous visit.  No results found for this or any previous visit.  No results found for this or any previous visit.  No results found for this or any previous visit.  Results for orders placed during the hospital encounter of 02/01/21  CT RENAL STONE STUDY  Narrative CLINICAL DATA:  Right flank pain.  EXAM: CT ABDOMEN AND PELVIS WITHOUT CONTRAST  TECHNIQUE: Multidetector CT imaging of the abdomen and pelvis was performed following the standard protocol without IV contrast.  COMPARISON:  April 23, 2018  FINDINGS: Lower chest: No acute abnormality.  Hepatobiliary: No focal liver abnormality is seen. No gallstones, gallbladder wall thickening, or biliary dilatation.  Pancreas: Unremarkable. No pancreatic ductal dilatation or  surrounding inflammatory changes.  Spleen: Normal in size without focal abnormality.  Adrenals/Urinary Tract: Adrenal glands are unremarkable. Kidneys are normal in size, without focal lesions. A 6 mm nonobstructing renal stone is seen within the mid to lower left kidney. A 2 mm nonobstructing renal stone is seen within the anterior aspect of the mid to lower right kidney. There is a 5 mm obstructing renal stone within the proximal to mid right ureter, with mild to moderate severity right-sided hydronephrosis, hydroureter and perinephric inflammatory fat stranding. Bladder is unremarkable.  Stomach/Bowel: Stomach is within normal limits. Appendix appears normal. No evidence of bowel wall thickening, distention, or inflammatory changes. Noninflamed diverticula are seen throughout the large bowel.  Vascular/Lymphatic: No significant vascular findings are present. No enlarged abdominal or pelvic lymph nodes.  Reproductive: The uterus is surgically absent. A 5.7 cm x 4.5 cm cyst is seen along the anterior aspect of the left adnexa.  Other:  No abdominal wall hernia or abnormality. No abdominopelvic ascites.  Musculoskeletal: No acute or significant osseous findings.  IMPRESSION: 1. 5 mm obstructing renal stone within the proximal to mid right ureter. 2. Bilateral nonobstructing renal calculi. 3. Colonic diverticulosis. 4. 5.7 cm x 4.5 cm left adnexal cyst, likely ovarian in origin. Correlation with pelvic ultrasound is recommended.   Electronically Signed By: Virgina Norfolk M.D. On: 02/01/2021 23:55   Assessment & Plan:    1. Kidney stones -We discussed the management of kidney stones. These options include observation, ureteroscopy, shockwave lithotripsy (ESWL) and percutaneous nephrolithotomy (PCNL). We discussed which options are relevant to the patient's stone(s). We discussed the natural history of kidney stones as well as the complications of untreated stones and the impact on quality of life without treatment as well as with each of the above listed treatments. We also discussed the efficacy of each treatment in its ability to clear the stone burden. With any of these management options I discussed the signs and symptoms of infection and the need for emergent treatment should these be experienced. For each option we discussed the ability of each procedure to clear the patient of their stone burden.   For observation I described the risks which include but are not limited to silent renal damage, life-threatening infection, need for emergent surgery, failure to pass stone and pain.   For ureteroscopy I described the risks which include bleeding, infection, damage to contiguous structures, positioning injury, ureteral stricture, ureteral avulsion, ureteral injury, need for prolonged ureteral stent, inability to perform ureteroscopy, need for an interval procedure, inability to clear stone burden, stent discomfort/pain, heart attack, stroke, pulmonary embolus and the inherent risks with general anesthesia.   For  shockwave lithotripsy I described the risks which include arrhythmia, kidney contusion, kidney hemorrhage, need for transfusion, pain, inability to adequately break up stone, inability to pass stone fragments, Steinstrasse, infection associated with obstructing stones, need for alternate surgical procedure, need for repeat shockwave lithotripsy, MI, CVA, PE and the inherent risks with anesthesia/conscious sedation.   For PCNL I described the risks including positioning injury, pneumothorax, hydrothorax, need for chest tube, inability to clear stone burden, renal laceration, arterial venous fistula or malformation, need for embolization of kidney, loss of kidney or renal function, need for repeat procedure, need for prolonged nephrostomy tube, ureteral avulsion, MI, CVA, PE and the inherent risks of general anesthesia.   - The patient would like to proceed with Right ESWL - Urinalysis, Routine w reflex microscopic   No follow-ups on file.  Nicolette Bang, MD  [facility]

## 2021-02-09 NOTE — Progress Notes (Signed)
02/09/2021 10:49 AM   [name] Dec 19, 1970 [id number]  Referring provider: Asencion Noble, MD [address] [place],  [place] [address]  nephrolithiasis   HPI: Sonya Kline is a 50yo here for evaluation of nephrolithiasis. Starting 10 days ago she developed severe right flank pain and presented to the ER. The pain was sharp, intermittent severe and nonraditing. Pain has since resolved. This is here 3rd stone event. She was diagnosed with a 50mm right proximal calculus in the ER.   She has not passed her calculus. No flank pain currently. No nausea or vomiting. No other complaints today. KUb from today shows an unchanged rigth proximal ureteral calculus.   PMH: Past Medical History:  Diagnosis Date   Diabetes mellitus, type II ([place])    Diverticulosis    HTN (hypertension)     Surgical History: Past Surgical History:  Procedure Laterality Date   ABDOMINAL HYSTERECTOMY     CESAREAN SECTION     x 2   COLONOSCOPY N/A 08/22/2019   Procedure: COLONOSCOPY;  Surgeon: Daneil Dolin, MD;  [facility];  Service: Endoscopy;  Laterality: N/A;  9:30   POLYPECTOMY  08/22/2019   Procedure: POLYPECTOMY;  Surgeon: Daneil Dolin, MD;  [facility];  Service: Endoscopy;;    Home Medications:  Allergies as of 02/09/2021       Reactions   Contrast Media [iodinated Contrast Media] Hives   Prednisone Rash        Medication List        Accurate as of February 09, 2021 10:49 AM. If you have any questions, ask your nurse or doctor.          amLODipine 10 MG tablet Commonly known as: NORVASC Take 10 mg by mouth daily.   BD Pen Needle Nano 2nd Gen 32G X 4 MM Misc Generic drug: Insulin Pen Needle SMARTSIG:Pre-Filled Pen Syringe Injection Daily   glipiZIDE 5 MG 24 hr tablet Commonly known as: GLUCOTROL XL TAKE 1 TABLET(5 MG) BY MOUTH DAILY WITH BREAKFAST   Lantus SoloStar 100 UNIT/ML Solostar Pen Generic drug: insulin glargine Inject 50 Units into the  skin at bedtime.   meloxicam 7.5 MG tablet Commonly known as: Mobic Take 1 tablet (7.5 mg total) by mouth daily.   oxyCODONE-acetaminophen 5-325 MG tablet Commonly known as: Percocet Take 1-2 tablets by mouth every 4 (four) hours as needed.   oxyCODONE-acetaminophen 5-325 MG tablet Commonly known as: Percocet Take 1-2 tablets by mouth every 4 (four) hours as needed.   rosuvastatin 5 MG tablet Commonly known as: CRESTOR TAKE 1 TABLET(5 MG) BY MOUTH DAILY   tamsulosin 0.4 MG Caps capsule Commonly known as: Flomax Take 1 capsule (0.4 mg total) by mouth daily.   Vitamin D (Ergocalciferol) 1.25 MG (50000 UNIT) Caps capsule Commonly known as: DRISDOL Take 1 capsule (50,000 Units total) by mouth every 7 (seven) days.        Allergies:  Allergies  Allergen Reactions   Contrast Media [Iodinated Contrast Media] Hives   Prednisone Rash    Family History: Family History  Problem Relation Age of Onset   Diabetes Mother    Hypertension Mother    Hypertension Father     Social History:  reports that she has never smoked. She has never used smokeless tobacco. She reports that she does not drink alcohol and does not use drugs.  ROS: All other review of systems were reviewed and are negative except what is noted above in HPI  Physical Exam: BP 122/70    Pulse (!) 101    LMP 11/14/2011   Constitutional:  Alert and oriented, No acute distress. HEENT: [place] AT, moist mucus membranes.  Trachea midline, no masses. Cardiovascular: No clubbing, cyanosis, or edema. Respiratory: Normal respiratory effort, no increased work of breathing. GI: Abdomen is soft, nontender, nondistended, no abdominal masses GU: No CVA tenderness.  Lymph: No cervical or inguinal lymphadenopathy. Skin: No rashes, bruises or suspicious lesions. Neurologic: Grossly intact, no focal deficits, moving all 4 extremities. Psychiatric: Normal mood and affect.  Laboratory Data: Lab Results  Component Value Date    WBC 21.8 (H) 02/01/2021   HGB 12.6 02/01/2021   HCT 40.6 02/01/2021   MCV 81.0 02/01/2021   PLT 390 02/01/2021    Lab Results  Component Value Date   CREATININE 0.92 02/01/2021    No results found for: PSA  No results found for: TESTOSTERONE  Lab Results  Component Value Date   HGBA1C 8.4 (A) 01/24/2021    Urinalysis    Component Value Date/Time   COLORURINE YELLOW 02/01/2021 2318   APPEARANCEUR HAZY (A) 02/01/2021 2318   LABSPEC 1.015 02/01/2021 2318   PHURINE 7.0 02/01/2021 2318   GLUCOSEU NEGATIVE 02/01/2021 2318   HGBUR LARGE (A) 02/01/2021 2318   BILIRUBINUR NEGATIVE 02/01/2021 2318   KETONESUR NEGATIVE 02/01/2021 2318   PROTEINUR NEGATIVE 02/01/2021 2318   UROBILINOGEN 0.2 10/17/2008 1602   NITRITE NEGATIVE 02/01/2021 2318   LEUKOCYTESUR NEGATIVE 02/01/2021 2318    Lab Results  Component Value Date   BACTERIA FEW (A) 02/01/2021    Pertinent Imaging: KUB today: images reviewed and discussed with the patient Results for orders placed in visit on 02/02/21  DG Abd 1 View  Narrative CLINICAL DATA:  Right flank pain for couple of days. Evaluate for kidney stone.  EXAM: ABDOMEN - 1 VIEW  COMPARISON:  CT scan February 01, 2021  FINDINGS: A 6 mm stone is seen in the mid right ureter overlying the superolateral aspect of the L4 transverse process. The kidneys are partially obscured by bowel contents. No other stones seen in the right kidney. At least 2 stones are seen in the left kidney. Calcifications in the pelvis are likely phleboliths. No other abnormalities.  IMPRESSION: 1. 6 mm stone in the mid right ureter as above, in similar position when compared to the CT scan from February 01, 2021. 2. Both kidneys are partially obscured by bowel contents. At least 2 stones are seen in the left kidney. No other stones are seen in the right kidney.   Electronically Signed By: Dorise Bullion III M.D. On: 02/03/2021 17:43  No results found for this  or any previous visit.  No results found for this or any previous visit.  No results found for this or any previous visit.  No results found for this or any previous visit.  No results found for this or any previous visit.  No results found for this or any previous visit.  Results for orders placed during the hospital encounter of 02/01/21  CT RENAL STONE STUDY  Narrative CLINICAL DATA:  Right flank pain.  EXAM: CT ABDOMEN AND PELVIS WITHOUT CONTRAST  TECHNIQUE: Multidetector CT imaging of the abdomen and pelvis was performed following the standard protocol without IV contrast.  COMPARISON:  April 23, 2018  FINDINGS: Lower chest: No acute abnormality.  Hepatobiliary: No focal liver abnormality is seen. No gallstones, gallbladder wall thickening, or biliary dilatation.  Pancreas: Unremarkable. No pancreatic ductal dilatation or  surrounding inflammatory changes.  Spleen: Normal in size without focal abnormality.  Adrenals/Urinary Tract: Adrenal glands are unremarkable. Kidneys are normal in size, without focal lesions. A 6 mm nonobstructing renal stone is seen within the mid to lower left kidney. A 2 mm nonobstructing renal stone is seen within the anterior aspect of the mid to lower right kidney. There is a 5 mm obstructing renal stone within the proximal to mid right ureter, with mild to moderate severity right-sided hydronephrosis, hydroureter and perinephric inflammatory fat stranding. Bladder is unremarkable.  Stomach/Bowel: Stomach is within normal limits. Appendix appears normal. No evidence of bowel wall thickening, distention, or inflammatory changes. Noninflamed diverticula are seen throughout the large bowel.  Vascular/Lymphatic: No significant vascular findings are present. No enlarged abdominal or pelvic lymph nodes.  Reproductive: The uterus is surgically absent. A 5.7 cm x 4.5 cm cyst is seen along the anterior aspect of the left adnexa.  Other:  No abdominal wall hernia or abnormality. No abdominopelvic ascites.  Musculoskeletal: No acute or significant osseous findings.  IMPRESSION: 1. 5 mm obstructing renal stone within the proximal to mid right ureter. 2. Bilateral nonobstructing renal calculi. 3. Colonic diverticulosis. 4. 5.7 cm x 4.5 cm left adnexal cyst, likely ovarian in origin. Correlation with pelvic ultrasound is recommended.   Electronically Signed By: Virgina Norfolk M.D. On: 02/01/2021 23:55   Assessment & Plan:    1. Kidney stones -We discussed the management of kidney stones. These options include observation, ureteroscopy, shockwave lithotripsy (ESWL) and percutaneous nephrolithotomy (PCNL). We discussed which options are relevant to the patient's stone(s). We discussed the natural history of kidney stones as well as the complications of untreated stones and the impact on quality of life without treatment as well as with each of the above listed treatments. We also discussed the efficacy of each treatment in its ability to clear the stone burden. With any of these management options I discussed the signs and symptoms of infection and the need for emergent treatment should these be experienced. For each option we discussed the ability of each procedure to clear the patient of their stone burden.   For observation I described the risks which include but are not limited to silent renal damage, life-threatening infection, need for emergent surgery, failure to pass stone and pain.   For ureteroscopy I described the risks which include bleeding, infection, damage to contiguous structures, positioning injury, ureteral stricture, ureteral avulsion, ureteral injury, need for prolonged ureteral stent, inability to perform ureteroscopy, need for an interval procedure, inability to clear stone burden, stent discomfort/pain, heart attack, stroke, pulmonary embolus and the inherent risks with general anesthesia.   For  shockwave lithotripsy I described the risks which include arrhythmia, kidney contusion, kidney hemorrhage, need for transfusion, pain, inability to adequately break up stone, inability to pass stone fragments, Steinstrasse, infection associated with obstructing stones, need for alternate surgical procedure, need for repeat shockwave lithotripsy, MI, CVA, PE and the inherent risks with anesthesia/conscious sedation.   For PCNL I described the risks including positioning injury, pneumothorax, hydrothorax, need for chest tube, inability to clear stone burden, renal laceration, arterial venous fistula or malformation, need for embolization of kidney, loss of kidney or renal function, need for repeat procedure, need for prolonged nephrostomy tube, ureteral avulsion, MI, CVA, PE and the inherent risks of general anesthesia.   - The patient would like to proceed with Right ESWL - Urinalysis, Routine w reflex microscopic   No follow-ups on file.  Sonya Bang, MD  [facility]

## 2021-02-10 ENCOUNTER — Encounter (HOSPITAL_COMMUNITY): Payer: Self-pay

## 2021-02-10 ENCOUNTER — Other Ambulatory Visit: Payer: Self-pay

## 2021-02-10 ENCOUNTER — Encounter (HOSPITAL_COMMUNITY)
Admission: RE | Admit: 2021-02-10 | Discharge: 2021-02-10 | Disposition: A | Discharge status: Home / Self Care | Payer: BC Managed Care – PPO | Source: Ambulatory Visit | Attending: Urology | Admitting: Urology

## 2021-02-11 NOTE — OR Nursing (Signed)
Dr. Alyson Ingles notified of patient glucose 239 and WBC 21.8

## 2021-02-15 ENCOUNTER — Encounter (HOSPITAL_COMMUNITY)
Admission: RE | Disposition: A | Discharge status: Home / Self Care | Payer: Self-pay | Source: Home / Self Care | Attending: Urology

## 2021-02-15 ENCOUNTER — Ambulatory Visit (HOSPITAL_COMMUNITY)
Admission: RE | Admit: 2021-02-15 | Discharge: 2021-02-15 | Disposition: A | Discharge status: Home / Self Care | Payer: BC Managed Care – PPO | Attending: Urology | Admitting: Urology

## 2021-02-15 ENCOUNTER — Encounter (HOSPITAL_COMMUNITY): Payer: Self-pay | Admitting: Urology

## 2021-02-15 ENCOUNTER — Ambulatory Visit (HOSPITAL_COMMUNITY): Payer: BC Managed Care – PPO

## 2021-02-15 ENCOUNTER — Other Ambulatory Visit: Payer: Self-pay

## 2021-02-15 DIAGNOSIS — E119 Type 2 diabetes mellitus without complications: Secondary | ICD-10-CM | POA: Insufficient documentation

## 2021-02-15 DIAGNOSIS — N201 Calculus of ureter: Secondary | ICD-10-CM | POA: Diagnosis not present

## 2021-02-15 DIAGNOSIS — Z87442 Personal history of urinary calculi: Secondary | ICD-10-CM | POA: Insufficient documentation

## 2021-02-15 DIAGNOSIS — N200 Calculus of kidney: Secondary | ICD-10-CM

## 2021-02-15 DIAGNOSIS — I10 Essential (primary) hypertension: Secondary | ICD-10-CM | POA: Insufficient documentation

## 2021-02-15 DIAGNOSIS — E669 Obesity, unspecified: Secondary | ICD-10-CM | POA: Insufficient documentation

## 2021-02-15 DIAGNOSIS — I878 Other specified disorders of veins: Secondary | ICD-10-CM | POA: Diagnosis not present

## 2021-02-15 HISTORY — PX: EXTRACORPOREAL SHOCK WAVE LITHOTRIPSY: SHX1557

## 2021-02-15 LAB — GLUCOSE, CAPILLARY: Glucose-Capillary: 105 mg/dL — ABNORMAL HIGH (ref 70–99)

## 2021-02-15 SURGERY — LITHOTRIPSY, ESWL
Anesthesia: Moderate Sedation | Laterality: Right

## 2021-02-15 MED ORDER — DIPHENHYDRAMINE HCL 25 MG PO CAPS
25.0000 mg | ORAL_CAPSULE | ORAL | Status: AC
  Administered 2021-02-15: 25 mg via ORAL
  Filled 2021-02-15: qty 1

## 2021-02-15 MED ORDER — DIAZEPAM 5 MG PO TABS
10.0000 mg | ORAL_TABLET | Freq: Once | ORAL | Status: AC
  Administered 2021-02-15: 10 mg via ORAL

## 2021-02-15 MED ORDER — TAMSULOSIN HCL 0.4 MG PO CAPS: 0.4000 mg | ORAL_CAPSULE | Freq: Every day | ORAL | 0 refills | Status: DC

## 2021-02-15 MED ORDER — SODIUM CHLORIDE 0.9 % IV SOLN: Freq: Once | INTRAVENOUS | Status: AC

## 2021-02-15 MED ORDER — DIAZEPAM 5 MG PO TABS
ORAL_TABLET | ORAL | Status: AC
  Filled 2021-02-15: qty 2

## 2021-02-15 MED ORDER — OXYCODONE-ACETAMINOPHEN 5-325 MG PO TABS: 1.0000 | ORAL_TABLET | Freq: Four times a day (QID) | ORAL | 0 refills | Status: DC | PRN

## 2021-02-15 NOTE — Interval H&P Note (Signed)
History and Physical Interval Note:  02/15/2021 8:48 AM  [name]  has presented today for surgery, with the diagnosis of RIGHT NEPHROLITHIASIS.  The various methods of treatment have been discussed with the patient and family. After consideration of risks, benefits and other options for treatment, the patient has consented to  Procedure(s): EXTRACORPOREAL SHOCK WAVE LITHOTRIPSY (ESWL) (Right) as a surgical intervention.  The patient's history has been reviewed, patient examined, no change in status, stable for surgery.  I have reviewed the patient's chart and labs.  Questions were answered to the patient's satisfaction.     Michaelle Birks

## 2021-02-16 ENCOUNTER — Encounter (HOSPITAL_COMMUNITY): Payer: Self-pay | Admitting: Urology

## 2021-02-22 ENCOUNTER — Other Ambulatory Visit: Payer: Self-pay | Admitting: Orthopedic Surgery

## 2021-02-22 DIAGNOSIS — M25462 Effusion, left knee: Secondary | ICD-10-CM

## 2021-03-01 ENCOUNTER — Encounter (HOSPITAL_COMMUNITY): Payer: Self-pay | Admitting: Radiology

## 2021-03-01 ENCOUNTER — Ambulatory Visit (HOSPITAL_COMMUNITY)
Admission: RE | Admit: 2021-03-01 | Discharge: 2021-03-01 | Disposition: A | Discharge status: Home / Self Care | Payer: BC Managed Care – PPO | Source: Ambulatory Visit | Attending: Urology | Admitting: Urology

## 2021-03-01 ENCOUNTER — Other Ambulatory Visit: Payer: Self-pay

## 2021-03-01 DIAGNOSIS — N200 Calculus of kidney: Secondary | ICD-10-CM | POA: Insufficient documentation

## 2021-03-02 ENCOUNTER — Encounter: Payer: Self-pay | Admitting: Physician Assistant

## 2021-03-02 ENCOUNTER — Ambulatory Visit (INDEPENDENT_AMBULATORY_CARE_PROVIDER_SITE_OTHER): Payer: BC Managed Care – PPO | Admitting: Physician Assistant

## 2021-03-02 VITALS — BP 137/75 | HR 109 | Wt 216.0 lb

## 2021-03-02 DIAGNOSIS — R8271 Bacteriuria: Secondary | ICD-10-CM

## 2021-03-02 DIAGNOSIS — N200 Calculus of kidney: Secondary | ICD-10-CM | POA: Diagnosis not present

## 2021-03-02 LAB — URINALYSIS, ROUTINE W REFLEX MICROSCOPIC
Bilirubin, UA: NEGATIVE
Glucose, UA: NEGATIVE
Ketones, UA: NEGATIVE
Leukocytes,UA: NEGATIVE
Nitrite, UA: NEGATIVE
Protein,UA: NEGATIVE
Specific Gravity, UA: 1.02 (ref 1.005–1.030)
Urobilinogen, Ur: 1 mg/dL (ref 0.2–1.0)
pH, UA: 7 (ref 5.0–7.5)

## 2021-03-02 LAB — MICROSCOPIC EXAMINATION: Renal Epithel, UA: NONE SEEN /hpf

## 2021-03-02 NOTE — Addendum Note (Signed)
Addended by: Tyrone Apple on: 03/02/2021 01:56 PM   Modules accepted: Orders

## 2021-03-02 NOTE — Progress Notes (Signed)
03/02/2021 10:50 AM   Sonya Kline 24-Jan-1971 [id number]  Referring provider: Asencion Noble, MD [address] [place],  [place] [address]  No chief complaint on file.   HPI:  The pt is a 51yo female who is s/p R EWSL on 02/15/21 for R ureteral stone. Pt without c/o and reports no urinary urgency, frequency, burning, or hematuria. She passed stone fragments which she brings with her today.  UA-3-10 RBCs  Few Bact  KUB images reviewed and right ureteral stone no longer visible. Left sided stone remains in lower pole of kidney.   PMH: Past Medical History:  Diagnosis Date   Diabetes mellitus, type II ([place])    Diverticulosis    HTN (hypertension)     Surgical History: Past Surgical History:  Procedure Laterality Date   ABDOMINAL HYSTERECTOMY     CESAREAN SECTION     x 2   COLONOSCOPY N/A 08/22/2019   Procedure: COLONOSCOPY;  Surgeon: Daneil Dolin, MD;  [facility];  Service: Endoscopy;  Laterality: N/A;  9:30   EXTRACORPOREAL SHOCK WAVE LITHOTRIPSY Right 02/15/2021   Procedure: EXTRACORPOREAL SHOCK WAVE LITHOTRIPSY (ESWL);  Surgeon: Primus Bravo., MD;  Location: [facility];  Service: Urology;  Laterality: Right;   POLYPECTOMY  08/22/2019   Procedure: POLYPECTOMY;  Surgeon: Daneil Dolin, MD;  [facility];  Service: Endoscopy;;    Home Medications:  Allergies as of 03/02/2021       Reactions   Contrast Media [iodinated Contrast Media] Hives   Prednisone Rash        Medication List        Accurate as of March 02, 2021 10:50 AM. If you have any questions, ask your nurse or doctor.          amLODipine 10 MG tablet Commonly known as: NORVASC Take 10 mg by mouth daily.   BD Pen Needle Nano 2nd Gen 32G X 4 MM Misc Generic drug: Insulin Pen Needle SMARTSIG:Pre-Filled Pen Syringe Injection Daily   glipiZIDE 5 MG 24 hr tablet Commonly known as: GLUCOTROL XL TAKE 1 TABLET(5 MG) BY MOUTH DAILY WITH BREAKFAST   Lantus SoloStar  100 UNIT/ML Solostar Pen Generic drug: insulin glargine Inject 50 Units into the skin at bedtime.   meloxicam 7.5 MG tablet Commonly known as: MOBIC TAKE 1 TABLET(7.5 MG) BY MOUTH DAILY   oxyCODONE-acetaminophen 5-325 MG tablet Commonly known as: Percocet Take 1-2 tablets by mouth every 6 (six) hours as needed.   rosuvastatin 5 MG tablet Commonly known as: CRESTOR TAKE 1 TABLET(5 MG) BY MOUTH DAILY   tamsulosin 0.4 MG Caps capsule Commonly known as: Flomax Take 1 capsule (0.4 mg total) by mouth daily.   Vitamin D (Ergocalciferol) 1.25 MG (50000 UNIT) Caps capsule Commonly known as: DRISDOL Take 1 capsule (50,000 Units total) by mouth every 7 (seven) days.        Allergies:  Allergies  Allergen Reactions   Contrast Media [Iodinated Contrast Media] Hives   Prednisone Rash    Family History: Family History  Problem Relation Age of Onset   Diabetes Mother    Hypertension Mother    Hypertension Father     Social History:  reports that she has never smoked. She has never used smokeless tobacco. She reports that she does not drink alcohol and does not use drugs.  ROS: All other review of systems were reviewed and are negative except what is noted above in HPI  Physical Exam: BP 137/75  Pulse (!) 109    Wt 216 lb (98 kg)    LMP 11/14/2011    BMI 39.51 kg/m   Constitutional:  Alert and oriented, No acute distress. HEENT: [place] AT Cardiovascular: Mildly tachycardic Respiratory: Normal respiratory effort, no increased work of breathing. GI: Abdomen non-distended GU: No CVA tenderness.  Neurologic: Grossly intact, no focal deficits, moving all 4 extremities. Psychiatric: Normal mood and affect.  Laboratory Data: Lab Results  Component Value Date   WBC 21.8 (H) 02/01/2021   HGB 12.6 02/01/2021   HCT 40.6 02/01/2021   MCV 81.0 02/01/2021   PLT 390 02/01/2021    Lab Results  Component Value Date   CREATININE 0.92 02/01/2021     Urinalysis    Component  Value Date/Time   COLORURINE YELLOW 02/01/2021 2318   APPEARANCEUR HAZY (A) 02/01/2021 2318   LABSPEC 1.015 02/01/2021 2318   PHURINE 7.0 02/01/2021 2318   GLUCOSEU NEGATIVE 02/01/2021 2318   HGBUR LARGE (A) 02/01/2021 2318   BILIRUBINUR NEGATIVE 02/01/2021 2318   KETONESUR NEGATIVE 02/01/2021 2318   PROTEINUR NEGATIVE 02/01/2021 2318   UROBILINOGEN 0.2 10/17/2008 1602   NITRITE NEGATIVE 02/01/2021 2318   LEUKOCYTESUR NEGATIVE 02/01/2021 2318    Lab Results  Component Value Date   BACTERIA FEW (A) 02/01/2021    Pertinent Imaging:  Results for orders placed during the hospital encounter of 03/01/21  DG Abd 1 View  Narrative CLINICAL DATA:  Post extracorporeal shockwave lithotripsy for right-sided stone 2 weeks ago.  EXAM: ABDOMEN - 1 VIEW  COMPARISON:  KUB 02/15/2021 CT abdomen and pelvis 02/03/2021  FINDINGS: On the prior CT and radiographs, a right ureteral stone was seen at the level of the L4-5 disc. This is no longer visualized in this region. There are numerous vascular phleboliths overlying the right-greater-than-left aspect of the pelvis. These appear similar to 02/15/2021 and the ureteral stone is not definitely visualized in this region.  A 6 mm calcification overlies the lower pole of the left kidney corresponding to the nonobstructing stone seen on prior CT.  Nonobstructive bowel-gas pattern. No acute skeletal abnormality. The lung bases are clear.  IMPRESSION: The previously seen stone within the distal right ureter at the L4-5 disc level is no longer visualized. Numerous vascular phleboliths overlying the pelvis appears similar to prior and cannot definitively see the ureteral stone advanced into this region. Question whether this stone has cleared.  Unchanged left lower pole 6 mm renal stone.   Electronically Signed By: Yvonne Kendall M.D. On: 03/02/2021 09:15  No results found for this or any previous visit.  No results found for this or  any previous visit.  No results found for this or any previous visit.  No results found for this or any previous visit.  No results found for this or any previous visit.  No results found for this or any previous visit.  Results for orders placed during the hospital encounter of 02/01/21  CT RENAL STONE STUDY  Narrative CLINICAL DATA:  Right flank pain.  EXAM: CT ABDOMEN AND PELVIS WITHOUT CONTRAST  TECHNIQUE: Multidetector CT imaging of the abdomen and pelvis was performed following the standard protocol without IV contrast.  COMPARISON:  April 23, 2018  FINDINGS: Lower chest: No acute abnormality.  Hepatobiliary: No focal liver abnormality is seen. No gallstones, gallbladder wall thickening, or biliary dilatation.  Pancreas: Unremarkable. No pancreatic ductal dilatation or surrounding inflammatory changes.  Spleen: Normal in size without focal abnormality.  Adrenals/Urinary Tract: Adrenal glands are unremarkable. Kidneys  are normal in size, without focal lesions. A 6 mm nonobstructing renal stone is seen within the mid to lower left kidney. A 2 mm nonobstructing renal stone is seen within the anterior aspect of the mid to lower right kidney. There is a 5 mm obstructing renal stone within the proximal to mid right ureter, with mild to moderate severity right-sided hydronephrosis, hydroureter and perinephric inflammatory fat stranding. Bladder is unremarkable.  Stomach/Bowel: Stomach is within normal limits. Appendix appears normal. No evidence of bowel wall thickening, distention, or inflammatory changes. Noninflamed diverticula are seen throughout the large bowel.  Vascular/Lymphatic: No significant vascular findings are present. No enlarged abdominal or pelvic lymph nodes.  Reproductive: The uterus is surgically absent. A 5.7 cm x 4.5 cm cyst is seen along the anterior aspect of the left adnexa.  Other: No abdominal wall hernia or abnormality. No  abdominopelvic ascites.  Musculoskeletal: No acute or significant osseous findings.  IMPRESSION: 1. 5 mm obstructing renal stone within the proximal to mid right ureter. 2. Bilateral nonobstructing renal calculi. 3. Colonic diverticulosis. 4. 5.7 cm x 4.5 cm left adnexal cyst, likely ovarian in origin. Correlation with pelvic ultrasound is recommended.   Electronically Signed By: Virgina Norfolk M.D. On: 02/01/2021 23:55   Assessment & Plan:    1. Kidney stones S/P R ESL 02/15/21 Prevention diet information given. Pt advised to reduce green tea intake and increase water. She does not drink sodas/brown tea. FU for 6 week post ESL visit after renal US  2. Bacteruria without pyuria.  Will culture urine and treat if result indicates need. Pt aware she will only receive a call if cx is abnormal.    No follow-ups on file.  [place], Berneice Heinrich, PA-C  [facility] [place]

## 2021-03-02 NOTE — Progress Notes (Signed)

## 2021-03-04 LAB — URINE CULTURE

## 2021-03-06 LAB — CALCULI, WITH PHOTOGRAPH (CLINICAL LAB)
Calcium Oxalate Dihydrate: 20 %
Calcium Oxalate Monohydrate: 60 %
Hydroxyapatite: 20 %
Weight Calculi: 9 mg

## 2021-03-11 ENCOUNTER — Other Ambulatory Visit: Payer: Self-pay | Admitting: Urology

## 2021-03-11 DIAGNOSIS — N200 Calculus of kidney: Secondary | ICD-10-CM

## 2021-03-11 NOTE — Progress Notes (Signed)
Urine analysis labs for follow up visit

## 2021-03-14 ENCOUNTER — Telehealth: Payer: Self-pay

## 2021-03-14 NOTE — Telephone Encounter (Signed)
Patient called and made aware. Lab appt made with patient. Patient will pick up 24 hour urine order form when she comes for lab work.

## 2021-03-14 NOTE — Telephone Encounter (Signed)
-----   Message from Reynaldo Minium, [place] sent at 03/11/2021 10:03 AM EST ----- Please let the pt know her stone analysis indicates she may need and Rx for future prevention. She will need a 24 hour urine study and will need to drop by the office for labs. She will receive information by mail on how to get the supplies for the urine sent to her. ----- Message ----- From: Interface, [facility] Lab Results In Sent: 03/02/2021  11:31 AM EST To: Reynaldo Minium, PA-C

## 2021-03-21 ENCOUNTER — Other Ambulatory Visit: Payer: BC Managed Care – PPO

## 2021-03-23 ENCOUNTER — Other Ambulatory Visit: Payer: BC Managed Care – PPO

## 2021-03-23 ENCOUNTER — Other Ambulatory Visit: Payer: Self-pay

## 2021-03-23 DIAGNOSIS — N200 Calculus of kidney: Secondary | ICD-10-CM | POA: Diagnosis not present

## 2021-03-24 LAB — MAGNESIUM: Magnesium: 1.9 mg/dL (ref 1.6–2.3)

## 2021-03-24 LAB — BASIC METABOLIC PANEL
BUN/Creatinine Ratio: 17 (ref 9–23)
BUN: 15 mg/dL (ref 6–24)
CO2: 25 mmol/L (ref 20–29)
Calcium: 9 mg/dL (ref 8.7–10.2)
Chloride: 103 mmol/L (ref 96–106)
Creatinine, Ser: 0.87 mg/dL (ref 0.57–1.00)
Glucose: 114 mg/dL — ABNORMAL HIGH (ref 70–99)
Potassium: 4.3 mmol/L (ref 3.5–5.2)
Sodium: 142 mmol/L (ref 134–144)
eGFR: 80 mL/min/{1.73_m2} (ref >59)

## 2021-03-24 LAB — URIC ACID: Uric Acid: 5.9 mg/dL (ref 3.0–7.2)

## 2021-03-28 ENCOUNTER — Ambulatory Visit (HOSPITAL_COMMUNITY)
Admission: RE | Admit: 2021-03-28 | Discharge: 2021-03-28 | Disposition: A | Discharge status: Home / Self Care | Payer: BC Managed Care – PPO | Source: Ambulatory Visit | Attending: Physician Assistant | Admitting: Physician Assistant

## 2021-03-28 ENCOUNTER — Other Ambulatory Visit: Payer: Self-pay

## 2021-03-28 DIAGNOSIS — N200 Calculus of kidney: Secondary | ICD-10-CM | POA: Insufficient documentation

## 2021-03-29 ENCOUNTER — Telehealth: Payer: Self-pay

## 2021-03-29 NOTE — Telephone Encounter (Signed)
Patient called and made aware.

## 2021-03-29 NOTE — Telephone Encounter (Signed)
-----   Message from Reynaldo Minium, [place] sent at 03/29/2021 12:48 PM EST ----- Please let the patient know that her renal ultrasound looks good and the previous stone identified is unchanged.  We will discuss the ultrasound further and will discuss the large stone remaining in her kidney at her return office visit next week. ----- Message ----- From: Interface, Rad Results In Sent: 03/28/2021   3:54 PM EST To: Reynaldo Minium, PA-C

## 2021-03-30 DIAGNOSIS — N200 Calculus of kidney: Secondary | ICD-10-CM | POA: Diagnosis not present

## 2021-04-04 ENCOUNTER — Other Ambulatory Visit: Payer: Self-pay | Admitting: Urology

## 2021-04-04 ENCOUNTER — Ambulatory Visit: Payer: BC Managed Care – PPO | Admitting: Physician Assistant

## 2021-04-06 ENCOUNTER — Ambulatory Visit: Payer: BC Managed Care – PPO | Admitting: Physician Assistant

## 2021-04-12 ENCOUNTER — Encounter: Payer: Self-pay | Admitting: Urology

## 2021-04-12 ENCOUNTER — Other Ambulatory Visit: Payer: Self-pay

## 2021-04-12 ENCOUNTER — Ambulatory Visit (INDEPENDENT_AMBULATORY_CARE_PROVIDER_SITE_OTHER): Payer: BC Managed Care – PPO | Admitting: Urology

## 2021-04-12 ENCOUNTER — Ambulatory Visit: Payer: BC Managed Care – PPO | Admitting: Physician Assistant

## 2021-04-12 VITALS — BP 117/71 | HR 93

## 2021-04-12 DIAGNOSIS — N200 Calculus of kidney: Secondary | ICD-10-CM

## 2021-04-12 LAB — URINALYSIS, ROUTINE W REFLEX MICROSCOPIC
Bilirubin, UA: NEGATIVE
Glucose, UA: NEGATIVE
Ketones, UA: NEGATIVE
Leukocytes,UA: NEGATIVE
Nitrite, UA: NEGATIVE
Specific Gravity, UA: 1.02 (ref 1.005–1.030)
Urobilinogen, Ur: 1 mg/dL (ref 0.2–1.0)
pH, UA: 7 (ref 5.0–7.5)

## 2021-04-12 LAB — MICROSCOPIC EXAMINATION
Epithelial Cells (non renal): >10 /hpf — AB (ref 0–10)
Renal Epithel, UA: NONE SEEN /hpf

## 2021-04-12 NOTE — Progress Notes (Signed)
04/12/2021 10:09 AM   [name] 1970/08/25 [id number]  Referring provider: Asencion Noble, MD [address] [place],  [place] [address]  Followup nephrolithiasis   HPI: Sonya Kline is a 51yo here for followup for nephrolithiasis. No stone events since last visit. Renal US 2/13 shows no hydronephrosis and a left 25mm lower pole calculus. No flank pain. No significant LUTS. No other complaints today.    PMH: Past Medical History:  Diagnosis Date   Diabetes mellitus, type II ([place])    Diverticulosis    HTN (hypertension)     Surgical History: Past Surgical History:  Procedure Laterality Date   ABDOMINAL HYSTERECTOMY     CESAREAN SECTION     x 2   COLONOSCOPY N/A 08/22/2019   Procedure: COLONOSCOPY;  Surgeon: Daneil Dolin, MD;  [facility];  Service: Endoscopy;  Laterality: N/A;  9:30   EXTRACORPOREAL SHOCK WAVE LITHOTRIPSY Right 02/15/2021   Procedure: EXTRACORPOREAL SHOCK WAVE LITHOTRIPSY (ESWL);  Surgeon: Primus Bravo., MD;  Location: [facility];  Service: Urology;  Laterality: Right;   POLYPECTOMY  08/22/2019   Procedure: POLYPECTOMY;  Surgeon: Daneil Dolin, MD;  [facility];  Service: Endoscopy;;    Home Medications:  Allergies as of 04/12/2021       Reactions   Contrast Media [iodinated Contrast Media] Hives   Prednisone Rash        Medication List        Accurate as of April 12, 2021 10:09 AM. If you have any questions, ask your nurse or doctor.          amLODipine 10 MG tablet Commonly known as: NORVASC Take 10 mg by mouth daily.   amoxicillin 500 MG capsule Commonly known as: AMOXIL Take 500 mg by mouth 4 (four) times daily.   BD Pen Needle Nano 2nd Gen 32G X 4 MM Misc Generic drug: Insulin Pen Needle SMARTSIG:Pre-Filled Pen Syringe Injection Daily   glipiZIDE 5 MG 24 hr tablet Commonly known as: GLUCOTROL XL TAKE 1 TABLET(5 MG) BY MOUTH DAILY WITH BREAKFAST   Lantus SoloStar 100 UNIT/ML Solostar  Pen Generic drug: insulin glargine Inject 50 Units into the skin at bedtime.   meloxicam 7.5 MG tablet Commonly known as: MOBIC TAKE 1 TABLET(7.5 MG) BY MOUTH DAILY   oxyCODONE-acetaminophen 5-325 MG tablet Commonly known as: Percocet Take 1-2 tablets by mouth every 6 (six) hours as needed.   rosuvastatin 5 MG tablet Commonly known as: CRESTOR TAKE 1 TABLET(5 MG) BY MOUTH DAILY   tamsulosin 0.4 MG Caps capsule Commonly known as: Flomax Take 1 capsule (0.4 mg total) by mouth daily.   Vitamin D (Ergocalciferol) 1.25 MG (50000 UNIT) Caps capsule Commonly known as: DRISDOL Take 1 capsule (50,000 Units total) by mouth every 7 (seven) days.        Allergies:  Allergies  Allergen Reactions   Contrast Media [Iodinated Contrast Media] Hives   Prednisone Rash    Family History: Family History  Problem Relation Age of Onset   Diabetes Mother    Hypertension Mother    Hypertension Father     Social History:  reports that she has never smoked. She has never used smokeless tobacco. She reports that she does not drink alcohol and does not use drugs.  ROS: All other review of systems were reviewed and are negative except what is noted above in HPI  Physical Exam: BP 117/71    Pulse 93    LMP 11/14/2011  Constitutional:  Alert and oriented, No acute distress. HEENT: [place] AT, moist mucus membranes.  Trachea midline, no masses. Cardiovascular: No clubbing, cyanosis, or edema. Respiratory: Normal respiratory effort, no increased work of breathing. GI: Abdomen is soft, nontender, nondistended, no abdominal masses GU: No CVA tenderness.  Lymph: No cervical or inguinal lymphadenopathy. Skin: No rashes, bruises or suspicious lesions. Neurologic: Grossly intact, no focal deficits, moving all 4 extremities. Psychiatric: Normal mood and affect.  Laboratory Data: Lab Results  Component Value Date   WBC 21.8 (H) 02/01/2021   HGB 12.6 02/01/2021   HCT 40.6 02/01/2021   MCV 81.0  02/01/2021   PLT 390 02/01/2021    Lab Results  Component Value Date   CREATININE 0.87 03/23/2021    No results found for: PSA  No results found for: TESTOSTERONE  Lab Results  Component Value Date   HGBA1C 8.4 (A) 01/24/2021    Urinalysis    Component Value Date/Time   COLORURINE YELLOW 02/01/2021 2318   APPEARANCEUR Clear 03/02/2021 1100   LABSPEC 1.015 02/01/2021 2318   PHURINE 7.0 02/01/2021 2318   GLUCOSEU Negative 03/02/2021 1100   HGBUR LARGE (A) 02/01/2021 2318   BILIRUBINUR Negative 03/02/2021 1100   KETONESUR NEGATIVE 02/01/2021 2318   PROTEINUR Negative 03/02/2021 1100   PROTEINUR NEGATIVE 02/01/2021 2318   UROBILINOGEN 0.2 10/17/2008 1602   NITRITE Negative 03/02/2021 1100   NITRITE NEGATIVE 02/01/2021 2318   LEUKOCYTESUR Negative 03/02/2021 1100   LEUKOCYTESUR NEGATIVE 02/01/2021 2318    Lab Results  Component Value Date   LABMICR See below: 03/02/2021   WBCUA 0-5 03/02/2021   LABEPIT 0-10 03/02/2021   BACTERIA Few 03/02/2021    Pertinent Imaging: Renal US 03/28/2021: Images reviewed and discussed with the patient  Results for orders placed during the hospital encounter of 03/01/21  DG Abd 1 View  Narrative CLINICAL DATA:  Post extracorporeal shockwave lithotripsy for right-sided stone 2 weeks ago.  EXAM: ABDOMEN - 1 VIEW  COMPARISON:  KUB 02/15/2021 CT abdomen and pelvis 02/03/2021  FINDINGS: On the prior CT and radiographs, a right ureteral stone was seen at the level of the L4-5 disc. This is no longer visualized in this region. There are numerous vascular phleboliths overlying the right-greater-than-left aspect of the pelvis. These appear similar to 02/15/2021 and the ureteral stone is not definitely visualized in this region.  A 6 mm calcification overlies the lower pole of the left kidney corresponding to the nonobstructing stone seen on prior CT.  Nonobstructive bowel-gas pattern. No acute skeletal abnormality. The lung bases  are clear.  IMPRESSION: The previously seen stone within the distal right ureter at the L4-5 disc level is no longer visualized. Numerous vascular phleboliths overlying the pelvis appears similar to prior and cannot definitively see the ureteral stone advanced into this region. Question whether this stone has cleared.  Unchanged left lower pole 6 mm renal stone.   Electronically Signed By: Yvonne Kendall M.D. On: 03/02/2021 09:15  No results found for this or any previous visit.  No results found for this or any previous visit.  No results found for this or any previous visit.  Results for orders placed during the hospital encounter of 03/28/21  Ultrasound renal complete  Narrative CLINICAL DATA:  Nephrolithiasis post lithotripsy.  EXAM: RENAL / URINARY TRACT ULTRASOUND COMPLETE  COMPARISON:  04/24/2018 and CT 02/01/2021  FINDINGS: Right Kidney:  Renal measurements: 10.4 x 5 1 x 6.4 cm = volume: 179 mL. Echogenicity within normal limits. No mass or  hydronephrosis visualized.  Left Kidney:  Renal measurements: 10.1 x 6.5 x 6.5 cm = volume: 223 ML. Echogenicity within normal limits. No mass or hydronephrosis visualized. 8 mm stone over the lower pole unchanged from prior CT.  Bladder:  Appears normal for degree of bladder distention. Right ureteral jet visualized.  Other:  None.  IMPRESSION: Normal size kidneys without hydronephrosis. 8 mm stone over the lower pole left kidney unchanged.   Electronically Signed By: Marin Olp M.D. On: 03/28/2021 15:51  No results found for this or any previous visit.  No results found for this or any previous visit.  Results for orders placed during the hospital encounter of 02/01/21  CT RENAL STONE STUDY  Narrative CLINICAL DATA:  Right flank pain.  EXAM: CT ABDOMEN AND PELVIS WITHOUT CONTRAST  TECHNIQUE: Multidetector CT imaging of the abdomen and pelvis was performed following the standard protocol  without IV contrast.  COMPARISON:  April 23, 2018  FINDINGS: Lower chest: No acute abnormality.  Hepatobiliary: No focal liver abnormality is seen. No gallstones, gallbladder wall thickening, or biliary dilatation.  Pancreas: Unremarkable. No pancreatic ductal dilatation or surrounding inflammatory changes.  Spleen: Normal in size without focal abnormality.  Adrenals/Urinary Tract: Adrenal glands are unremarkable. Kidneys are normal in size, without focal lesions. A 6 mm nonobstructing renal stone is seen within the mid to lower left kidney. A 2 mm nonobstructing renal stone is seen within the anterior aspect of the mid to lower right kidney. There is a 5 mm obstructing renal stone within the proximal to mid right ureter, with mild to moderate severity right-sided hydronephrosis, hydroureter and perinephric inflammatory fat stranding. Bladder is unremarkable.  Stomach/Bowel: Stomach is within normal limits. Appendix appears normal. No evidence of bowel wall thickening, distention, or inflammatory changes. Noninflamed diverticula are seen throughout the large bowel.  Vascular/Lymphatic: No significant vascular findings are present. No enlarged abdominal or pelvic lymph nodes.  Reproductive: The uterus is surgically absent. A 5.7 cm x 4.5 cm cyst is seen along the anterior aspect of the left adnexa.  Other: No abdominal wall hernia or abnormality. No abdominopelvic ascites.  Musculoskeletal: No acute or significant osseous findings.  IMPRESSION: 1. 5 mm obstructing renal stone within the proximal to mid right ureter. 2. Bilateral nonobstructing renal calculi. 3. Colonic diverticulosis. 4. 5.7 cm x 4.5 cm left adnexal cyst, likely ovarian in origin. Correlation with pelvic ultrasound is recommended.   Electronically Signed By: Virgina Norfolk M.D. On: 02/01/2021 23:55   Assessment & Plan:    1. Kidney stones -RTC 6 months with KUB -dietary handout given  -  Urinalysis, Routine w reflex microscopic     No follow-ups on file.  Nicolette Bang, MD  [facility] [place]

## 2021-04-12 NOTE — Patient Instructions (Signed)
Dietary Guidelines to Help Prevent Kidney Stones Kidney stones are deposits of minerals and salts that form inside your kidneys. Your risk of developing kidney stones may be greater depending on your diet, your lifestyle, the medicines you take, and whether you have certain medical conditions. Most people can lower their chances of developing kidney stones by following the instructions below. Your dietitian may give you more specific instructions depending on your overall health and the type of kidney stones you tend to develop. What are tips for following this plan? Reading food labels  Choose foods with "no salt added" or "low-salt" labels. Limit your salt (sodium) intake to less than 1,500 mg a day. Choose foods with calcium for each meal and snack. Try to eat about 300 mg of calcium at each meal. Foods that contain 200-500 mg of calcium a serving include: 8 oz (237 mL) of milk, calcium-fortifiednon-dairy milk, and calcium-fortifiedfruit juice. Calcium-fortified means that calcium has been added to these drinks. 8 oz (237 mL) of kefir, yogurt, and soy yogurt. 4 oz (114 g) of tofu. 1 oz (28 g) of cheese. 1 cup (150 g) of dried figs. 1 cup (91 g) of cooked broccoli. One 3 oz (85 g) can of sardines or mackerel. Most people need 1,000-1,500 mg of calcium a day. Talk to your dietitian about how much calcium is recommended for you. Shopping Buy plenty of fresh fruits and vegetables. Most people do not need to avoid fruits and vegetables, even if these foods contain nutrients that may contribute to kidney stones. When shopping for convenience foods, choose: Whole pieces of fruit. Pre-made salads with dressing on the side. Low-fat fruit and yogurt smoothies. Avoid buying frozen meals or prepared deli foods. These can be high in sodium. Look for foods with live cultures, such as yogurt and kefir. Choose high-fiber grains, such as whole-wheat breads, oat bran, and wheat cereals. Cooking Do not add  salt to food when cooking. Place a salt shaker on the table and allow each person to add his or her own salt to taste. Use vegetable protein, such as beans, textured vegetable protein (TVP), or tofu, instead of meat in pasta, casseroles, and soups. Meal planning Eat less salt, if told by your dietitian. To do this: Avoid eating processed or pre-made food. Avoid eating fast food. Eat less animal protein, including cheese, meat, poultry, or fish, if told by your dietitian. To do this: Limit the number of times you have meat, poultry, fish, or cheese each week. Eat a diet free of meat at least 2 days a week. Eat only one serving each day of meat, poultry, fish, or seafood. When you prepare animal protein, cut pieces into small portion sizes. For most meat and fish, one serving is about the size of the palm of your hand. Eat at least five servings of fresh fruits and vegetables each day. To do this: Keep fruits and vegetables on hand for snacks. Eat one piece of fruit or a handful of berries with breakfast. Have a salad and fruit at lunch. Have two kinds of vegetables at dinner. Limit foods that are high in a substance called oxalate. These include: Spinach (cooked), rhubarb, beets, sweet potatoes, and Swiss chard. Peanuts. Potato chips, french fries, and baked potatoes with skin on. Nuts and nut products. Chocolate. If you regularly take a diuretic medicine, make sure to eat at least 1 or 2 servings of fruits or vegetables that are high in potassium each day. These include: Avocado. Banana. Orange, prune,   carrot, or tomato juice. Baked potato. Cabbage. Beans and split peas. Lifestyle  Drink enough fluid to keep your urine pale yellow. This is the most important thing you can do. Spread your fluid intake throughout the day. If you drink alcohol: Limit how much you use to: 0-1 drink a day for women who are not pregnant. 0-2 drinks a day for men. Be aware of how much alcohol is in your  drink. In the [place] one drink equals one 12 oz bottle of beer (355 mL), one 5 oz glass of wine (148 mL), or one 1 oz glass of hard liquor (44 mL). Lose weight if told by your health care provider. Work with your dietitian to find an eating plan and weight loss strategies that work best for you. General information Talk to your health care provider and dietitian about taking daily supplements. You may be told the following depending on your health and the cause of your kidney stones: Not to take supplements with vitamin C. To take a calcium supplement. To take a daily probiotic supplement. To take other supplements such as magnesium, fish oil, or vitamin B6. Take over-the-counter and prescription medicines only as told by your health care provider. These include supplements. What foods should I limit? Limit your intake of the following foods, or eat them as told by your dietitian. Vegetables Spinach. Rhubarb. Beets. Canned vegetables. Pickles. Olives. Baked potatoes with skin. Grains Wheat bran. Baked goods. Salted crackers. Cereals high in sugar. Meats and other proteins Nuts. Nut butters. Large portions of meat, poultry, or fish. Salted, precooked, or cured meats, such as sausages, meat loaves, and hot dogs. Dairy Cheese. Beverages Regular soft drinks. Regular vegetable juice. Seasonings and condiments Seasoning blends with salt. Salad dressings. Soy sauce. Ketchup. Barbecue sauce. Other foods Canned soups. Canned pasta sauce. Casseroles. Pizza. Lasagna. Frozen meals. Potato chips. French fries. The items listed above may not be a complete list of foods and beverages you should limit. Contact a dietitian for more information. What foods should I avoid? Talk to your dietitian about specific foods you should avoid based on the type of kidney stones you have and your overall health. Fruits Grapefruit. The item listed above may not be a complete list of foods and beverages you should  avoid. Contact a dietitian for more information. Summary Kidney stones are deposits of minerals and salts that form inside your kidneys. You can lower your risk of kidney stones by making changes to your diet. The most important thing you can do is drink enough fluid. Drink enough fluid to keep your urine pale yellow. Talk to your dietitian about how much calcium you should have each day, and eat less salt and animal protein as told by your dietitian. This information is not intended to replace advice given to you by your health care provider. Make sure you discuss any questions you have with your health care provider. Document Revised: 01/23/2019 Document Reviewed: 01/23/2019 Elsevier Patient Education  2022 Elsevier Inc.  

## 2021-04-18 NOTE — Progress Notes (Signed)
Sent via mychart and mail. 

## 2021-04-25 ENCOUNTER — Ambulatory Visit (INDEPENDENT_AMBULATORY_CARE_PROVIDER_SITE_OTHER): Payer: BC Managed Care – PPO | Admitting: Nurse Practitioner

## 2021-04-25 ENCOUNTER — Encounter: Payer: Self-pay | Admitting: Nurse Practitioner

## 2021-04-25 ENCOUNTER — Telehealth: Payer: Self-pay | Admitting: Nurse Practitioner

## 2021-04-25 ENCOUNTER — Other Ambulatory Visit: Payer: Self-pay

## 2021-04-25 VITALS — BP 111/77 | HR 64 | Ht 62.0 in | Wt 215.0 lb

## 2021-04-25 DIAGNOSIS — E1165 Type 2 diabetes mellitus with hyperglycemia: Secondary | ICD-10-CM | POA: Diagnosis not present

## 2021-04-25 DIAGNOSIS — I10 Essential (primary) hypertension: Secondary | ICD-10-CM | POA: Diagnosis not present

## 2021-04-25 LAB — POCT GLYCOSYLATED HEMOGLOBIN (HGB A1C): HbA1c POC (<> result, manual entry): 7.6 % (ref 4.0–5.6)

## 2021-04-25 MED ORDER — ROSUVASTATIN CALCIUM 5 MG PO TABS: ORAL_TABLET | ORAL | 1 refills | Status: DC

## 2021-04-25 NOTE — Telephone Encounter (Signed)
Pt requesting a refill on rosuvastatin (CRESTOR) 5 MG tablet. [address] ?

## 2021-04-25 NOTE — Progress Notes (Signed)
04/25/2021, 11:30 AM  Endocrinology follow-up note   Subjective:    Patient ID: Sonya Kline, female    DOB: 06-Feb-1971.  Sonya Kline is being seen in follow-up after she was seen in consultation for management of currently uncontrolled symptomatic diabetes requested by  Asencion Noble, MD.   Past Medical History:  Diagnosis Date   Diabetes mellitus, type II ([place])    Diverticulosis    HTN (hypertension)     Past Surgical History:  Procedure Laterality Date   ABDOMINAL HYSTERECTOMY     CESAREAN SECTION     x 2   COLONOSCOPY N/A 08/22/2019   Procedure: COLONOSCOPY;  Surgeon: Daneil Dolin, MD;  Location: [facility];  Service: Endoscopy;  Laterality: N/A;  9:30   EXTRACORPOREAL SHOCK WAVE LITHOTRIPSY Right 02/15/2021   Procedure: EXTRACORPOREAL SHOCK WAVE LITHOTRIPSY (ESWL);  Surgeon: Primus Bravo., MD;  Location: [facility];  Service: Urology;  Laterality: Right;   POLYPECTOMY  08/22/2019   Procedure: POLYPECTOMY;  Surgeon: Daneil Dolin, MD;  Location: [facility];  Service: Endoscopy;;    Social History   Socioeconomic History   Marital status: Married    Spouse name: Not on file   Number of children: Not on file   Years of education: college   Highest education level: Not on file  Occupational History   Not on file  Tobacco Use   Smoking status: Never   Smokeless tobacco: Never  Vaping Use   Vaping Use: Never used  Substance and Sexual Activity   Alcohol use: No   Drug use: No   Sexual activity: Yes    Birth control/protection: None, Surgical  Other Topics Concern   Not on file  Social History Narrative   Not on file   Social Determinants of Health   Financial Resource Strain: Not on file  Food Insecurity: Not on file  Transportation Needs: Not on file  Physical Activity: Not on file  Stress: Not on file  Social Connections: Not on file    Family History  Problem  Relation Age of Onset   Diabetes Mother    Hypertension Mother    Hypertension Father     Outpatient Encounter Medications as of 04/25/2021  Medication Sig   amLODipine (NORVASC) 10 MG tablet Take 10 mg by mouth daily.    BD PEN NEEDLE NANO 2ND GEN 32G X 4 MM MISC SMARTSIG:Pre-Filled Pen Syringe Injection Daily   glipiZIDE (GLUCOTROL XL) 5 MG 24 hr tablet TAKE 1 TABLET(5 MG) BY MOUTH DAILY WITH BREAKFAST   insulin glargine (LANTUS SOLOSTAR) 100 UNIT/ML Solostar Pen Inject 50 Units into the skin at bedtime.   meloxicam (MOBIC) 7.5 MG tablet TAKE 1 TABLET(7.5 MG) BY MOUTH DAILY   rosuvastatin (CRESTOR) 5 MG tablet TAKE 1 TABLET(5 MG) BY MOUTH DAILY   [DISCONTINUED] amoxicillin (AMOXIL) 500 MG capsule Take 500 mg by mouth 4 (four) times daily.   [DISCONTINUED] oxyCODONE-acetaminophen (PERCOCET) 5-325 MG tablet Take 1-2 tablets by mouth every 6 (six) hours as needed.   [DISCONTINUED] tamsulosin (FLOMAX) 0.4 MG CAPS capsule Take 1 capsule (0.4 mg total) by mouth daily.   [  DISCONTINUED] Vitamin D, Ergocalciferol, (DRISDOL) 1.25 MG (50000 UNIT) CAPS capsule Take 1 capsule (50,000 Units total) by mouth every 7 (seven) days.   No facility-administered encounter medications on file as of 04/25/2021.    ALLERGIES: Allergies  Allergen Reactions   Contrast Media [Iodinated Contrast Media] Hives   Prednisone Rash    VACCINATION STATUS: Immunization History  Administered Date(s) Administered   Rabies, IM 03/17/2013, 03/20/2013, 03/27/2013, 04/11/2013   Tdap 03/17/2013    Diabetes She presents for her follow-up diabetic visit. She has type 2 diabetes mellitus. Onset time: She was diagnosed at approximate age of 32 years. Her disease course has been improving. There are no hypoglycemic associated symptoms. Pertinent negatives for hypoglycemia include no confusion, headaches, pallor or seizures. Pertinent negatives for diabetes include no chest pain, no fatigue, no polydipsia, no polyphagia and no  polyuria. There are no hypoglycemic complications. Symptoms are stable. There are no diabetic complications. Risk factors for coronary artery disease include diabetes mellitus, hypertension, obesity and dyslipidemia. Current diabetic treatment includes insulin injections and oral agent (monotherapy). She is compliant with treatment most of the time. Her weight is stable. She is following a generally healthy diet. When asked about meal planning, she reported none. She has not had a previous visit with a dietitian. She rarely participates in exercise. Her home blood glucose trend is decreasing steadily. Her breakfast blood glucose range is generally 140-180 mg/dl. Her bedtime blood glucose range is generally >200 mg/dl. (She presents today with her logs, no meter, showing slightly above target glycemic profile overall.  Her POCT A1c today is 7.6%, improving from last visit of 8.4%.  She has not been exercising like she had been previously as her husband recently had surgery and she has been caring for him.  She does have plans to restart.  She denies any hypoglycemia.) An ACE inhibitor/angiotensin II receptor blocker is not being taken. She does not see a podiatrist.Eye exam is current.  Hypertension This is a chronic problem. The current episode started more than 1 year ago. The problem has been resolved since onset. The problem is controlled. Pertinent negatives include no chest pain, headaches, palpitations or shortness of breath. There are no associated agents to hypertension. Risk factors for coronary artery disease include diabetes mellitus, family history, obesity, sedentary lifestyle and dyslipidemia. Past treatments include calcium channel blockers. The current treatment provides mild improvement. Compliance problems include diet and exercise.   Hyperlipidemia This is a chronic problem. The current episode started more than 1 year ago. The problem is uncontrolled. Recent lipid tests were reviewed and are  high. Exacerbating diseases include diabetes. There are no known factors aggravating her hyperlipidemia. Pertinent negatives include no chest pain or shortness of breath. Current antihyperlipidemic treatment includes statins. The current treatment provides mild improvement of lipids. Compliance problems include adherence to diet and adherence to exercise.  Risk factors for coronary artery disease include diabetes mellitus, dyslipidemia, hypertension, obesity and a sedentary lifestyle.   Review of systems  Constitutional: + Minimally fluctuating body weight,  current Body mass index is 39.32 kg/m. , no fatigue, no subjective hyperthermia, no subjective hypothermia Eyes: no blurry vision, no xerophthalmia ENT: no sore throat, no nodules palpated in throat, no dysphagia/odynophagia, no hoarseness Cardiovascular: no chest pain, no shortness of breath, no palpitations, no leg swelling Respiratory: no cough, no shortness of breath Gastrointestinal: no nausea/vomiting/diarrhea Musculoskeletal: no muscle/joint aches Skin: no rashes, no hyperemia Neurological: no tremors, no numbness, no tingling, no dizziness Psychiatric: no depression, no anxiety  Objective:    BP 111/77    Pulse 64    Ht '5\' 2"'$  (1.575 m)    Wt 215 lb (97.5 kg)    LMP 11/14/2011    BMI 39.32 kg/m    Wt Readings from Last 3 Encounters:  04/25/21 215 lb (97.5 kg)  03/02/21 216 lb (98 kg)  02/10/21 214 lb (97.1 kg)    BP Readings from Last 3 Encounters:  04/25/21 111/77  04/12/21 117/71  03/02/21 137/75    Physical Exam- Limited  Constitutional:  Body mass index is 39.32 kg/m. , not in acute distress, normal state of mind Eyes:  EOMI, no exophthalmos Neck: Supple Cardiovascular: RRR, no murmurs, rubs, or gallops, no edema Respiratory: Adequate breathing efforts, no crackles, rales, rhonchi, or wheezing Musculoskeletal: no gross deformities, strength intact in all four extremities, no gross restriction of joint  movements Skin:  no rashes, no hyperemia Neurological: no tremor with outstretched hands     CMP ( most recent) CMP     Component Value Date/Time   NA 142 03/23/2021 1045   K 4.3 03/23/2021 1045   CL 103 03/23/2021 1045   CO2 25 03/23/2021 1045   GLUCOSE 114 (H) 03/23/2021 1045   GLUCOSE 239 (H) 02/01/2021 2320   BUN 15 03/23/2021 1045   CREATININE 0.87 03/23/2021 1045   CALCIUM 9.0 03/23/2021 1045   PROT 8.0 02/01/2021 2320   PROT 7.0 01/13/2021 0745   ALBUMIN 3.6 02/01/2021 2320   ALBUMIN 3.7 (L) 01/13/2021 0745   AST 20 02/01/2021 2320   ALT 17 02/01/2021 2320   ALKPHOS 74 02/01/2021 2320   BILITOT 0.3 02/01/2021 2320   BILITOT 0.2 01/13/2021 0745   GFRNONAA >60 02/01/2021 2320   GFRAA 102 01/21/2020 0737     Diabetic Labs (most recent): Lab Results  Component Value Date   HGBA1C 7.6 04/25/2021   HGBA1C 8.4 (A) 01/24/2021   HGBA1C 9.5 (A) 10/20/2020        Assessment & Plan:   1) Uncontrolled type 2 diabetes mellitus with hyperglycemia (H[facility])  - Sonya Kline has currently uncontrolled symptomatic type 2 DM since 51 years of age.  Recent labs reviewed.   She presents today with her logs, no meter, showing slightly above target glycemic profile overall.  Her POCT A1c today is 7.6%, improving from last visit of 8.4%.  She has not been exercising like she had been previously as her husband recently had surgery and she has been caring for him.  She does have plans to restart.  She denies any hypoglycemia.  - I had a long discussion with her about the progressive nature of diabetes and the pathology behind its complications. -She does not report gross complications of diabetes, however she remains at a high risk for more acute and chronic complications which include CAD, CVA, CKD, retinopathy, and neuropathy. These are all discussed in detail with her.  - Nutritional counseling repeated at each appointment due to patients tendency to fall back in to old  habits.  - The patient admits there is a room for improvement in their diet and drink choices. -  Suggestion is made for the patient to avoid simple carbohydrates from their diet including Cakes, Sweet Desserts / Pastries, Ice Cream, Soda (diet and regular), Sweet Tea, Candies, Chips, Cookies, Sweet Pastries, Store Bought Juices, Alcohol in Excess of 1-2 drinks a day, Artificial Sweeteners, Coffee Creamer, and "Sugar-free" Products. This will help patient to have stable blood glucose profile and potentially avoid  unintended weight gain.   - I encouraged the patient to switch to unprocessed or minimally processed complex starch and increased protein intake (animal or plant source), fruits, and vegetables.   - Patient is advised to stick to a routine mealtimes to eat 3 meals a day and avoid unnecessary snacks (to snack only to correct hypoglycemia).  - she is following with Jearld Fenton, RDN, CDE for diabetes education.  - I have approached her with the following individualized plan to manage  her diabetes and patient agrees:   -In light of her prevailing glycemic burden, she will continue to need insulin treatment in order for her to achieve and maintain control of diabetes to target.   -She is advised to continue her Lantus 50 units SQ nightly and Glipizide 5 mg XL daily with breakfast.   She is encouraged to incorporate more physical exercise.  - she is encouraged to continue monitoring blood glucose twice daily, before breakfast and before bed, and to call the clinic if she has readings less than 70 or greater than 300 for 3 tests in a row.  - she will be considered for incretin therapy as appropriate next visit.  - Specific targets for  A1c;  LDL, HDL,  and Triglycerides were discussed with the patient.  2) Blood Pressure /Hypertension:  Her blood pressure is controlled to target.  She is advised to continue Norvasc 10 mg po daily.    3) Lipids/Hyperlipidemia:  Her most recent lipid  panel from 01/13/21 shows uncontrolled LDL at 107 (improving).   She is advised to continue Crestor 5 mg po daily before bed.  Side effects and precautions discussed with her.    4)  Weight/Diet:  Her Body mass index is 39.32 kg/m.  -   clearly complicating her diabetes care.   she is a candidate for weight loss. I discussed with her the fact that loss of 5 - 10% of her  current body weight will have the most impact on her diabetes management.  Exercise, and detailed carbohydrates information provided  -  detailed on discharge instructions.  5) Vitamin D Deficiency: Her recent vitamin D level from 01/13/21 was low at 15.6.  She is not currently on any supplementation.  I discussed and initiated replenishment with Ergocalciferol 50000 units po weekly x 12 weeks.  6) Chronic Care/Health Maintenance: -she is not on ACEI/ARB is on Statin medications and is encouraged to initiate and continue to follow up with Ophthalmology, Dentist,  Podiatrist at least yearly or according to recommendations, and advised to stay away from smoking. I have recommended yearly flu vaccine and pneumonia vaccine at least every 5 years; moderate intensity exercise for up to 150 minutes weekly; and  sleep for at least 7 hours a day.  - she is advised to maintain close follow up with Asencion Noble, MD for primary care needs, as well as her other providers for optimal and coordinated care.      I spent 40 minutes in the care of the patient today including review of labs from [place], Lipids, Thyroid Function, Hematology (current and previous including abstractions from other facilities); face-to-face time discussing  her blood glucose readings/logs, discussing hypoglycemia and hyperglycemia episodes and symptoms, medications doses, her options of short and long term treatment based on the latest standards of care / guidelines;  discussion about incorporating lifestyle medicine;  and documenting the encounter.    Please refer to Patient  Instructions for Blood Glucose Monitoring and Insulin/Medications Dosing Guide"  in media tab for additional information. Please  also refer to " Patient Self Inventory" in the Media  tab for reviewed elements of pertinent patient history.  [name] participated in the discussions, expressed understanding, and voiced agreement with the above plans.  All questions were answered to her satisfaction. she is encouraged to contact clinic should she have any questions or concerns prior to her return visit.    Follow up plan: - Return in about 4 months (around 08/25/2021) for Diabetes F/U- A1c and UM in office, No previsit labs, Bring meter and logs.  Rayetta Pigg, [facility] [address] [place], [place] [address] Phone: [phone] Fax: [phone]  04/25/2021, 11:30 AM

## 2021-04-25 NOTE — Telephone Encounter (Signed)
Medication has been sent to the requested pharmacy. ?

## 2021-04-25 NOTE — Patient Instructions (Signed)

## 2021-04-26 ENCOUNTER — Ambulatory Visit: Payer: BC Managed Care – PPO | Admitting: Nurse Practitioner

## 2021-05-13 ENCOUNTER — Telehealth: Payer: Self-pay | Admitting: Internal Medicine

## 2021-05-13 ENCOUNTER — Other Ambulatory Visit (HOSPITAL_COMMUNITY): Payer: Self-pay | Admitting: Internal Medicine

## 2021-05-13 DIAGNOSIS — Z1231 Encounter for screening mammogram for malignant neoplasm of breast: Secondary | ICD-10-CM

## 2021-05-13 NOTE — Telephone Encounter (Signed)
Pt called to schedule her EGD. [phone] ?

## 2021-05-13 NOTE — Telephone Encounter (Signed)
Spoke with pt. She has been scheduled for her EGD/DIL with Dr. Abbey Chatters. New instructions mailed to her. ?

## 2021-05-18 ENCOUNTER — Ambulatory Visit (HOSPITAL_COMMUNITY)
Admission: RE | Admit: 2021-05-18 | Discharge: 2021-05-18 | Disposition: A | Discharge status: Home / Self Care | Payer: BC Managed Care – PPO | Source: Ambulatory Visit | Attending: Internal Medicine | Admitting: Internal Medicine

## 2021-05-18 DIAGNOSIS — Z1231 Encounter for screening mammogram for malignant neoplasm of breast: Secondary | ICD-10-CM | POA: Diagnosis not present

## 2021-06-07 ENCOUNTER — Encounter (HOSPITAL_COMMUNITY): Payer: Self-pay

## 2021-06-07 ENCOUNTER — Ambulatory Visit (HOSPITAL_COMMUNITY): Payer: BC Managed Care – PPO | Admitting: Anesthesiology

## 2021-06-07 ENCOUNTER — Encounter (HOSPITAL_COMMUNITY)
Admission: RE | Disposition: A | Discharge status: Home / Self Care | Payer: Self-pay | Source: Home / Self Care | Attending: Internal Medicine

## 2021-06-07 ENCOUNTER — Ambulatory Visit (HOSPITAL_COMMUNITY)
Admission: RE | Admit: 2021-06-07 | Discharge: 2021-06-07 | Disposition: A | Discharge status: Home / Self Care | Payer: BC Managed Care – PPO | Attending: Internal Medicine | Admitting: Internal Medicine

## 2021-06-07 ENCOUNTER — Other Ambulatory Visit: Payer: Self-pay

## 2021-06-07 DIAGNOSIS — R1310 Dysphagia, unspecified: Secondary | ICD-10-CM | POA: Diagnosis not present

## 2021-06-07 DIAGNOSIS — E119 Type 2 diabetes mellitus without complications: Secondary | ICD-10-CM | POA: Insufficient documentation

## 2021-06-07 DIAGNOSIS — K222 Esophageal obstruction: Secondary | ICD-10-CM | POA: Diagnosis not present

## 2021-06-07 DIAGNOSIS — Z794 Long term (current) use of insulin: Secondary | ICD-10-CM | POA: Diagnosis not present

## 2021-06-07 DIAGNOSIS — I10 Essential (primary) hypertension: Secondary | ICD-10-CM | POA: Diagnosis not present

## 2021-06-07 HISTORY — PX: ESOPHAGOGASTRODUODENOSCOPY (EGD) WITH PROPOFOL: SHX5813

## 2021-06-07 HISTORY — PX: BALLOON DILATION: SHX5330

## 2021-06-07 LAB — GLUCOSE, CAPILLARY: Glucose-Capillary: 102 mg/dL — ABNORMAL HIGH (ref 70–99)

## 2021-06-07 SURGERY — ESOPHAGOGASTRODUODENOSCOPY (EGD) WITH PROPOFOL
Anesthesia: General

## 2021-06-07 MED ORDER — LACTATED RINGERS IV SOLN: INTRAVENOUS | Status: DC

## 2021-06-07 MED ORDER — PROPOFOL 10 MG/ML IV BOLUS
INTRAVENOUS | Status: DC | PRN
  Administered 2021-06-07: 120 mg via INTRAVENOUS

## 2021-06-07 MED ORDER — LIDOCAINE HCL (CARDIAC) PF 100 MG/5ML IV SOSY
PREFILLED_SYRINGE | INTRAVENOUS | Status: DC | PRN
Start: 2021-06-07 — End: 2021-06-07
  Administered 2021-06-07: 50 mg via INTRAVENOUS

## 2021-06-07 NOTE — H&P (Signed)
Primary Care Physician:  Asencion Noble, MD ?Primary Gastroenterologist:  Dr. Abbey Chatters ? ?Pre-Procedure History & Physical: ?HPI:  Sonya Kline is a 51 y.o. female is here for an EGD with possible dilation due to Dysphagia.  ? ?Past Medical History:  ?Diagnosis Date  ? Diabetes mellitus, type II ([place])   ? Diverticulosis   ? HTN (hypertension)   ? ? ?Past Surgical History:  ?Procedure Laterality Date  ? ABDOMINAL HYSTERECTOMY    ? CESAREAN SECTION    ? x 2  ? COLONOSCOPY N/A 08/22/2019  ? Procedure: COLONOSCOPY;  Surgeon: Daneil Dolin, MD;  [facility];  Service: Endoscopy;  Laterality: N/A;  9:30  ? EXTRACORPOREAL SHOCK WAVE LITHOTRIPSY Right 02/15/2021  ? Procedure: EXTRACORPOREAL SHOCK WAVE LITHOTRIPSY (ESWL);  Surgeon: Primus Bravo., MD;  Location: [facility];  Service: Urology;  Laterality: Right;  ? POLYPECTOMY  08/22/2019  ? Procedure: POLYPECTOMY;  Surgeon: Daneil Dolin, MD;  [facility];  Service: Endoscopy;;  ? ? ?Prior to Admission medications   ?Medication Sig Start Date End Date Taking? Authorizing Provider  ?amLODipine (NORVASC) 10 MG tablet Take 10 mg by mouth daily.    Yes [provider]  ?glipiZIDE (GLUCOTROL XL) 5 MG 24 hr tablet TAKE 1 TABLET(5 MG) BY MOUTH DAILY WITH BREAKFAST 01/27/21  Yes Brita Romp, NP  ?meloxicam (MOBIC) 7.5 MG tablet TAKE 1 TABLET(7.5 MG) BY MOUTH DAILY 02/23/21  Yes Carole Civil, MD  ?rosuvastatin (CRESTOR) 5 MG tablet TAKE 1 TABLET(5 MG) BY MOUTH DAILY 04/25/21  Yes Brita Romp, NP  ?BD PEN NEEDLE NANO 2ND GEN 32G X 4 MM MISC SMARTSIG:Pre-Filled Pen Syringe Injection Daily 10/28/19   [provider]  ?insulin glargine (LANTUS SOLOSTAR) 100 UNIT/ML Solostar Pen Inject 50 Units into the skin at bedtime. 01/24/21   Brita Romp, NP  ? ? ?Allergies as of 05/13/2021 - Review Complete 04/25/2021  ?Allergen Reaction Noted  ? Contrast media [iodinated contrast media] Hives 11/21/2011  ? Prednisone Rash 12/20/2013   ? ? ?Family History  ?Problem Relation Age of Onset  ? Diabetes Mother   ? Hypertension Mother   ? Hypertension Father   ? ? ?Social History  ? ?Socioeconomic History  ? Marital status: Married  ?  Spouse name: Not on file  ? Number of children: Not on file  ? Years of education: college  ? Highest education level: Not on file  ?Occupational History  ? Not on file  ?Tobacco Use  ? Smoking status: Never  ? Smokeless tobacco: Never  ?Vaping Use  ? Vaping Use: Never used  ?Substance and Sexual Activity  ? Alcohol use: No  ? Drug use: No  ? Sexual activity: Yes  ?  Birth control/protection: None, Surgical  ?Other Topics Concern  ? Not on file  ?Social History Narrative  ? Not on file  ? ?Social Determinants of Health  ? ?Financial Resource Strain: Not on file  ?Food Insecurity: Not on file  ?Transportation Needs: Not on file  ?Physical Activity: Not on file  ?Stress: Not on file  ?Social Connections: Not on file  ?Intimate Partner Violence: Not on file  ? ? ?Review of Systems: ?See HPI, otherwise negative ROS ? ?Physical Exam: ?Vital signs in last 24 hours: ?Weight:  [90.7 kg] 90.7 kg (04/25 1314) ?  ?General:   Alert,  Well-developed, well-nourished, pleasant and cooperative in NAD ?Head:  Normocephalic and atraumatic. ?Eyes:  Sclera clear, no icterus.  Conjunctiva pink. ?Ears:  Normal auditory acuity. ?Nose:  No deformity, discharge,  or lesions. ?Mouth:  No deformity or lesions, dentition normal. ?Neck:  Supple; no masses or thyromegaly. ?Lungs:  Clear throughout to auscultation.   No wheezes, crackles, or rhonchi. No acute distress. ?Heart:  Regular rate and rhythm; no murmurs, clicks, rubs,  or gallops. ?Abdomen:  Soft, nontender and nondistended. No masses, hepatosplenomegaly or hernias noted. Normal bowel sounds, without guarding, and without rebound.   ?Msk:  Symmetrical without gross deformities. Normal posture. ?Extremities:  Without clubbing or edema. ?Neurologic:  Alert and  oriented x4;  grossly normal  neurologically. ?Skin:  Intact without significant lesions or rashes. ?Cervical Nodes:  No significant cervical adenopathy. ?Psych:  Alert and cooperative. Normal mood and affect. ? ?Impression/Plan: ?Sonya Kline is here for an EGD with possible dilation due to Dysphagia.  ? ?The risks of the procedure including infection, bleed, or perforation as well as benefits, limitations, alternatives and imponderables have been reviewed with the patient. Questions have been answered. All parties agreeable. ? ? ?

## 2021-06-07 NOTE — Op Note (Signed)
[facility] ?Patient Name: Sonya Kline ?Procedure Date: 06/07/2021 1:18 PM ?MRN: [id number] ?Date of Birth: 06-06-1970 ?Attending MD: Elon Alas. Abbey Chatters , DO ?CSN: [id number] ?Age: 51 ?Admit Type: Outpatient ?Procedure:                Upper GI endoscopy ?Indications:              Dysphagia ?Providers:                Elon Alas. Abbey Chatters, DO, Lambert Mody, [place]  ?                          [place] ?Referring MD:              ?Medicines:                See the Anesthesia note for documentation of the  ?                          administered medications ?Complications:            No immediate complications. ?Estimated Blood Loss:     Estimated blood loss was minimal. ?Procedure:                Pre-Anesthesia Assessment: ?                          - The anesthesia plan was to use monitored  ?                          anesthesia care (MAC). ?                          After obtaining informed consent, the endoscope was  ?                          passed under direct vision. Throughout the  ?                          procedure, the patient's blood pressure, pulse, and  ?                          oxygen saturations were monitored continuously. The  ?                          GIF-H190 ([id number]) scope was introduced through the  ?                          mouth, and advanced to the second part of duodenum.  ?                          The upper GI endoscopy was accomplished without  ?                          difficulty. The patient tolerated the procedure  ?                          well. ?Scope In: 1:32:57 PM ?Scope Out: 1:38:03 PM ?  Total Procedure Duration: 0 hours 5 minutes 6 seconds  ?Findings: ?     The Z-line was regular and was found 35 cm from the incisors. ?     A mild Schatzki ring was found in the distal esophagus. A TTS dilator  ?     was passed through the scope. Dilation with an 18-19-20 mm balloon  ?     dilator was performed to 20 mm. The dilation site was examined and  ?     showed mild mucosal disruption  and moderate improvement in luminal  ?     narrowing. ?     The entire examined stomach was normal. ?     The duodenal bulb, first portion of the duodenum and second portion of  ?     the duodenum were normal. ?Impression:               - Z-line regular, 35 cm from the incisors. ?                          - Mild Schatzki ring. Dilated. ?                          - Normal stomach. ?                          - Normal duodenal bulb, first portion of the  ?                          duodenum and second portion of the duodenum. ?                          - No specimens collected. ?Moderate Sedation: ?     Per Anesthesia Care ?Recommendation:           - Patient has a contact number available for  ?                          emergencies. The signs and symptoms of potential  ?                          delayed complications were discussed with the  ?                          patient. Return to normal activities tomorrow.  ?                          Written discharge instructions were provided to the  ?                          patient. ?                          - Resume previous diet. ?                          - Continue present medications. ?                          -  Repeat upper endoscopy PRN for retreatment. ?                          - Return to GI clinic in 4 months. ?Procedure Code(s):        --- Professional --- ?                          [phone], Esophagogastroduodenoscopy, flexible,  ?                          transoral; with transendoscopic balloon dilation of  ?                          esophagus (less than 30 mm diameter) ?Diagnosis Code(s):        --- Professional --- ?                          K22.2, Esophageal obstruction ?                          R13.10, Dysphagia, unspecified ?CPT copyright 2019 American Medical Association. All rights reserved. ?The codes documented in this report are preliminary and upon coder review may  ?be revised to meet current compliance requirements. ?Elon Alas. Abbey Chatters,  DO ?Elon Alas. Abbey Chatters, DO ?06/07/2021 1:41:38 PM ?This report has been signed electronically. ?Number of Addenda: 0 ?

## 2021-06-07 NOTE — Anesthesia Procedure Notes (Signed)
Date/Time: 06/07/2021 1:36 PM ?Performed by: Orlie Dakin, CRNA ?Pre-anesthesia Checklist: Patient identified, Emergency Drugs available, Suction available and Patient being monitored ?Patient Re-evaluated:Patient Re-evaluated prior to induction ?Oxygen Delivery Method: Nasal cannula ?Induction Type: IV induction ?Placement Confirmation: positive ETCO2 ? ? ? ? ?

## 2021-06-07 NOTE — Discharge Instructions (Addendum)
EGD ?Discharge instructions ?Please read the instructions outlined below and refer to this sheet in the next few weeks. These discharge instructions provide you with general information on caring for yourself after you leave the hospital. Your doctor may also give you specific instructions. While your treatment has been planned according to the most current medical practices available, unavoidable complications occasionally occur. If you have any problems or questions after discharge, please call your doctor. ?ACTIVITY ?You may resume your regular activity but move at a slower pace for the next 24 hours.  ?Take frequent rest periods for the next 24 hours.  ?Walking will help expel (get rid of) the air and reduce the bloated feeling in your abdomen.  ?No driving for 24 hours (because of the anesthesia (medicine) used during the test).  ?You may shower.  ?Do not sign any important legal documents or operate any machinery for 24 hours (because of the anesthesia used during the test).  ?NUTRITION ?Drink plenty of fluids.  ?You may resume your normal diet.  ?Begin with a light meal and progress to your normal diet.  ?Avoid alcoholic beverages for 24 hours or as instructed by your caregiver.  ?MEDICATIONS ?You may resume your normal medications unless your caregiver tells you otherwise.  ?WHAT YOU CAN EXPECT TODAY ?You may experience abdominal discomfort such as a feeling of fullness or ?gas? pains.  ?FOLLOW-UP ?Your doctor will discuss the results of your test with you.  ?SEEK IMMEDIATE MEDICAL ATTENTION IF ANY OF THE FOLLOWING OCCUR: ?Excessive nausea (feeling sick to your stomach) and/or vomiting.  ?Severe abdominal pain and distention (swelling).  ?Trouble swallowing.  ?Temperature over 101? F (37.8? C).  ?Rectal bleeding or vomiting of blood.  ? ? ?You had a tightening of your esophagus which I stretched with a balloon today.  Hopefully this helps with your swallowing.  Your stomach and small bowel both appeared  normal.  Continue current medications.  Follow-up with GI in 4 months. ? ?I hope you have a great rest of your week! ? ?Elon Alas. Abbey Chatters, D.O. ?Gastroenterology and Hepatology ?[facility] ? ?

## 2021-06-07 NOTE — Transfer of Care (Signed)
Immediate Anesthesia Transfer of Care Note ? ?Patient: Sonya Kline ? ?Procedure(s) Performed: ESOPHAGOGASTRODUODENOSCOPY (EGD) WITH PROPOFOL ?BALLOON DILATION ? ?Patient Location: Endoscopy Unit ? ?Anesthesia Type:General ? ?Level of Consciousness: drowsy ? ?Airway & Oxygen Therapy: Patient Spontanous Breathing ? ?Post-op Assessment: Report given to RN and Post -op Vital signs reviewed and stable ? ?Post vital signs: Reviewed and stable ? ?Last Vitals:  ?Vitals Value Taken Time  ?BP    ?Temp    ?Pulse    ?Resp    ?SpO2    ? ? ?Last Pain:  ?Vitals:  ? 06/07/21 1330  ?TempSrc:   ?PainSc: 0-No pain  ?   ? ?Patients Stated Pain Goal: 10 (06/07/21 1314) ? ?Complications: No notable events documented. ?

## 2021-06-07 NOTE — Anesthesia Postprocedure Evaluation (Signed)
Anesthesia Post Note ? ?Patient: Sonya Kline ? ?Procedure(s) Performed: ESOPHAGOGASTRODUODENOSCOPY (EGD) WITH PROPOFOL ?BALLOON DILATION ? ?Patient location during evaluation: Endoscopy ?Anesthesia Type: General ?Level of consciousness: awake and alert and oriented ?Pain management: pain level controlled ?Vital Signs Assessment: post-procedure vital signs reviewed and stable ?Respiratory status: spontaneous breathing, nonlabored ventilation and respiratory function stable ?Cardiovascular status: blood pressure returned to baseline and stable ?Postop Assessment: no apparent nausea or vomiting ?Anesthetic complications: no ? ? ?No notable events documented. ? ? ?Last Vitals:  ?Vitals:  ? 06/07/21 1319 06/07/21 1344  ?BP: 130/81 (!) 96/53  ?Pulse:  95  ?Resp: 13 18  ?Temp: 36.8 ?C 36.5 ?C  ?SpO2: 96% 95%  ?  ?Last Pain:  ?Vitals:  ? 06/07/21 1344  ?TempSrc: Oral  ?PainSc: 0-No pain  ? ? ?  ?  ?  ?  ?  ?  ? ? C  ? ? ? ? ?

## 2021-06-07 NOTE — Anesthesia Preprocedure Evaluation (Addendum)
Anesthesia Evaluation  ?Patient identified by MRN, date of birth, ID band ?Patient awake ? ? ? ?Reviewed: ?Allergy & Precautions, NPO status , Patient's Chart, lab work & pertinent test results ? ?Airway ?Mallampati: III ? ?TM Distance: >3 FB ?Neck ROM: Full ? ? ? Dental ? ?(+) Dental Advisory Given, Missing ?  ?Pulmonary ?neg pulmonary ROS,  ?  ?Pulmonary exam normal ?breath sounds clear to auscultation ? ? ? ? ? ? Cardiovascular ?Exercise Tolerance: Good ?hypertension, Pt. on medications ?Normal cardiovascular exam ?Rhythm:Regular Rate:Normal ? ? ?  ?Neuro/Psych ?negative neurological ROS ? negative psych ROS  ? GI/Hepatic ?negative GI ROS, Neg liver ROS,   ?Endo/Other  ?diabetes, Well Controlled, Type 2, Oral Hypoglycemic Agents, Insulin Dependent ? Renal/GU ?Renal disease (stones)  ?negative genitourinary ?  ?Musculoskeletal ?negative musculoskeletal ROS ?(+)  ? Abdominal ?  ?Peds ?negative pediatric ROS ?(+)  Hematology ?negative hematology ROS ?(+)   ?Anesthesia Other Findings ? ? Reproductive/Obstetrics ?negative OB ROS ? ?  ? ? ? ? ? ? ? ? ? ? ? ? ? ?  ?  ? ? ? ? ? ? ? ?Anesthesia Physical ?Anesthesia Plan ? ?ASA: 2 ? ?Anesthesia Plan: General  ? ?Post-op Pain Management: Minimal or no pain anticipated  ? ?Induction: Intravenous ? ?PONV Risk Score and Plan: TIVA ? ?Airway Management Planned: Nasal Cannula and Natural Airway ? ?Additional Equipment:  ? ?Intra-op Plan:  ? ?Post-operative Plan:  ? ?Informed Consent: I have reviewed the patients History and Physical, chart, labs and discussed the procedure including the risks, benefits and alternatives for the proposed anesthesia with the patient or authorized representative who has indicated his/her understanding and acceptance.  ? ? ? ?Dental advisory given ? ?Plan Discussed with: CRNA and Surgeon ? ?Anesthesia Plan Comments:   ? ? ? ? ? ? ?Anesthesia Quick Evaluation ? ?

## 2021-06-10 ENCOUNTER — Encounter (HOSPITAL_COMMUNITY): Payer: Self-pay | Admitting: Internal Medicine

## 2021-06-13 ENCOUNTER — Other Ambulatory Visit: Payer: Self-pay

## 2021-06-13 ENCOUNTER — Encounter: Payer: Self-pay | Admitting: Emergency Medicine

## 2021-06-13 ENCOUNTER — Ambulatory Visit
Admission: EM | Admit: 2021-06-13 | Discharge: 2021-06-13 | Disposition: A | Discharge status: Home / Self Care | Payer: BC Managed Care – PPO | Attending: Family Medicine | Admitting: Family Medicine

## 2021-06-13 DIAGNOSIS — J039 Acute tonsillitis, unspecified: Secondary | ICD-10-CM | POA: Insufficient documentation

## 2021-06-13 LAB — POCT RAPID STREP A (OFFICE): Rapid Strep A Screen: NEGATIVE

## 2021-06-13 MED ORDER — LIDOCAINE VISCOUS HCL 2 % MT SOLN: 10.0000 mL | OROMUCOSAL | 0 refills | Status: DC | PRN

## 2021-06-13 MED ORDER — AMOXICILLIN 875 MG PO TABS: 875.0000 mg | ORAL_TABLET | Freq: Two times a day (BID) | ORAL | 0 refills | Status: DC

## 2021-06-13 MED ORDER — FLUCONAZOLE 150 MG PO TABS: 150.0000 mg | ORAL_TABLET | Freq: Once | ORAL | 0 refills | Status: AC

## 2021-06-13 NOTE — ED Triage Notes (Signed)
Pt reports cough, congestion, sore throat x1 week. Pt inquiring about strep and reports if px abx will need something for yeast infection as well. ?

## 2021-06-13 NOTE — ED Provider Notes (Signed)
?[employer] [facility] ? ? ? ?CSN: [id number] ?Arrival date & time: 06/13/21  2751 ? ? ?  ? ?History   ?Chief Complaint ?Chief Complaint  ?Patient presents with  ? Sore Throat  ? ? ?HPI ?Sonya Kline is a 51 y.o. female.  ? ?Presenting today with 1 week history of cough, congestion, sore throat, fatigue.  Denies chest pain, shortness of breath, abdominal pain, nausea vomiting or diarrhea.  So far not trying anything over-the-counter for symptoms.  No known sick contacts recently. ? ?Past Medical History:  ?Diagnosis Date  ? Diabetes mellitus, type II ([place])   ? Diverticulosis   ? HTN (hypertension)   ? ? ?Patient Active Problem List  ? Diagnosis Date Noted  ? Kidney stones 03/02/2021  ? Uncontrolled type 2 diabetes mellitus with hyperglycemia ([place]) 10/29/2019  ? Essential hypertension, benign 10/29/2019  ? Pain of upper abdomen   ? Ileus ([place]) 04/23/2018  ? Labral tear of hip, degenerative 10/16/2013  ? Ankle pain, left 05/03/2011  ? DIVERTICULITIS, HX OF 03/09/2009  ? ? ?Past Surgical History:  ?Procedure Laterality Date  ? ABDOMINAL HYSTERECTOMY    ? BALLOON DILATION N/A 06/07/2021  ? Procedure: BALLOON DILATION;  Surgeon: Eloise Harman, DO;  [facility];  Service: Endoscopy;  Laterality: N/A;  ? CESAREAN SECTION    ? x 2  ? COLONOSCOPY N/A 08/22/2019  ? Procedure: COLONOSCOPY;  Surgeon: Daneil Dolin, MD;  [facility];  Service: Endoscopy;  Laterality: N/A;  9:30  ? ESOPHAGOGASTRODUODENOSCOPY (EGD) WITH PROPOFOL N/A 06/07/2021  ? Procedure: ESOPHAGOGASTRODUODENOSCOPY (EGD) WITH PROPOFOL;  Surgeon: Eloise Harman, DO;  [facility];  Service: Endoscopy;  Laterality: N/A;  3:00pm  ? EXTRACORPOREAL SHOCK WAVE LITHOTRIPSY Right 02/15/2021  ? Procedure: EXTRACORPOREAL SHOCK WAVE LITHOTRIPSY (ESWL);  Surgeon: Primus Bravo., MD;  Location: [facility];  Service: Urology;  Laterality: Right;  ? POLYPECTOMY  08/22/2019  ? Procedure: POLYPECTOMY;  Surgeon: Daneil Dolin, MD;   [facility];  Service: Endoscopy;;  ? ? ?OB History   ? ? Gravida  ?4  ? Para  ?2  ? Term  ?2  ? Preterm  ?   ? AB  ?2  ? Living  ?2  ?  ? ? SAB  ?2  ? IAB  ?   ? Ectopic  ?   ? Multiple  ?   ? Live Births  ?   ?   ?  ?  ? ? ? ?Home Medications   ? ?Prior to Admission medications   ?Medication Sig Start Date End Date Taking? Authorizing Provider  ?amoxicillin (AMOXIL) 875 MG tablet Take 1 tablet (875 mg total) by mouth 2 (two) times daily. 06/13/21  Yes Volney American, PA-C  ?fluconazole (DIFLUCAN) 150 MG tablet Take 1 tablet (150 mg total) by mouth once for 1 dose. 06/13/21 06/13/21 Yes Volney American, PA-C  ?lidocaine (XYLOCAINE) 2 % solution Use as directed 10 mLs in the mouth or throat every 3 (three) hours as needed for mouth pain. 06/13/21  Yes Volney American, PA-C  ?amLODipine (NORVASC) 10 MG tablet Take 10 mg by mouth daily.     [provider]  ?BD PEN NEEDLE NANO 2ND GEN 32G X 4 MM MISC SMARTSIG:Pre-Filled Pen Syringe Injection Daily 10/28/19   [provider]  ?glipiZIDE (GLUCOTROL XL) 5 MG 24 hr tablet TAKE 1 TABLET(5 MG) BY MOUTH DAILY WITH BREAKFAST 01/27/21   Rayetta Pigg  J, NP  ?insulin glargine (LANTUS SOLOSTAR) 100 UNIT/ML Solostar Pen Inject 50 Units into the skin at bedtime. 01/24/21   Brita Romp, NP  ?meloxicam (MOBIC) 7.5 MG tablet TAKE 1 TABLET(7.5 MG) BY MOUTH DAILY 02/23/21   Carole Civil, MD  ?rosuvastatin (CRESTOR) 5 MG tablet TAKE 1 TABLET(5 MG) BY MOUTH DAILY 04/25/21   Brita Romp, NP  ? ? ?Family History ?Family History  ?Problem Relation Age of Onset  ? Diabetes Mother   ? Hypertension Mother   ? Hypertension Father   ? ? ?Social History ?Social History  ? ?Tobacco Use  ? Smoking status: Never  ? Smokeless tobacco: Never  ?Vaping Use  ? Vaping Use: Never used  ?Substance Use Topics  ? Alcohol use: No  ? Drug use: No  ? ? ? ?Allergies   ?Contrast media [iodinated contrast media] and Prednisone ? ? ?Review of  Systems ?Review of Systems ?Per HPI ? ?Physical Exam ?Triage Vital Signs ?ED Triage Vitals  ?Enc Vitals Group  ?   BP 06/13/21 0858 127/80  ?   Pulse Rate 06/13/21 0858 (!) 105  ?   Resp 06/13/21 0858 18  ?   Temp 06/13/21 0858 98.9 ?F (37.2 ?C)  ?   Temp Source 06/13/21 0858 Oral  ?   SpO2 06/13/21 0858 97 %  ?   Weight 06/13/21 0859 198 lb 6.6 oz (90 kg)  ?   Height 06/13/21 0859 '5\' 2"'$  (1.575 m)  ?   Head Circumference --   ?   Peak Flow --   ?   Pain Score 06/13/21 0859 2  ?   Pain Loc --   ?   Pain Edu? --   ?   Excl. in [place]? --   ? ?No data found. ? ?Updated Vital Signs ?BP 127/80 (BP Location: Right Arm)   Pulse (!) 105   Temp 98.9 ?F (37.2 ?C) (Oral)   Resp 18   Ht '5\' 2"'$  (1.575 m)   Wt 198 lb 6.6 oz (90 kg)   LMP 11/14/2011   SpO2 97%   BMI 36.29 kg/m?  ? ?Visual Acuity ?Right Eye Distance:   ?Left Eye Distance:   ?Bilateral Distance:   ? ?Right Eye Near:   ?Left Eye Near:    ?Bilateral Near:    ? ?Physical Exam ?Vitals and nursing note reviewed.  ?Constitutional:   ?   Appearance: Normal appearance. She is not ill-appearing.  ?HENT:  ?   Head: Atraumatic.  ?   Right Ear: Tympanic membrane and external ear normal.  ?   Left Ear: Tympanic membrane and external ear normal.  ?   Nose: Rhinorrhea present.  ?   Mouth/Throat:  ?   Mouth: Mucous membranes are moist.  ?   Pharynx: Oropharyngeal exudate and posterior oropharyngeal erythema present.  ?   Comments: Bilateral tonsillar erythema, edema, exudates ?Eyes:  ?   Extraocular Movements: Extraocular movements intact.  ?   Conjunctiva/sclera: Conjunctivae normal.  ?Cardiovascular:  ?   Rate and Rhythm: Normal rate and regular rhythm.  ?   Heart sounds: Normal heart sounds.  ?Pulmonary:  ?   Effort: Pulmonary effort is normal.  ?   Breath sounds: Normal breath sounds. No wheezing or rales.  ?Musculoskeletal:     ?   General: Normal range of motion.  ?   Cervical back: Normal range of motion and neck supple.  ?Lymphadenopathy:  ?   Cervical: Cervical  adenopathy present.  ?  Skin: ?   General: Skin is warm and dry.  ?Neurological:  ?   Mental Status: She is alert and oriented to person, place, and time.  ?Psychiatric:     ?   Mood and Affect: Mood normal.     ?   Thought Content: Thought content normal.     ?   Judgment: Judgment normal.  ? ?UC Treatments / Results  ?Labs ?(all labs ordered are listed, but only abnormal results are displayed) ?Labs Reviewed  ?CULTURE, GROUP A STREP [facility])  ?POCT RAPID STREP A (OFFICE)  ? ? ?EKG ? ? ?Radiology ?No results found. ? ?Procedures ?Procedures (including critical care time) ? ?Medications Ordered in UC ?Medications - No data to display ? ?Initial Impression / Assessment and Plan / UC Course  ?I have reviewed the triage vital signs and the nursing notes. ? ?Pertinent labs & imaging results that were available during my care of the patient were reviewed by me and considered in my medical decision making (see chart for details). ? ?  ? ?Mildly tachycardic in triage, otherwise vital signs reassuring.  Rapid strep negative, throat culture pending.  Given exam findings will treat with Amoxil, viscous lidocaine for possible bacterial cause and Diflucan sent for prevention of yeast infection as she states antibiotics give her 1 each time.  Return for acutely worsening symptoms. ? ?Final Clinical Impressions(s) / UC Diagnoses  ? ?Final diagnoses:  ?Acute tonsillitis, unspecified etiology  ? ?Discharge Instructions   ?None ?  ? ?ED Prescriptions   ? ? Medication Sig Dispense Auth. Provider  ? amoxicillin (AMOXIL) 875 MG tablet Take 1 tablet (875 mg total) by mouth 2 (two) times daily. 20 tablet Volney American, [place]  ? lidocaine (XYLOCAINE) 2 % solution Use as directed 10 mLs in the mouth or throat every 3 (three) hours as needed for mouth pain. 100 mL Volney American, PA-C  ? fluconazole (DIFLUCAN) 150 MG tablet Take 1 tablet (150 mg total) by mouth once for 1 dose. 1 tablet Volney American, [place]  ? ?  ? ?PDMP  not reviewed this encounter. ?  ?Volney American, PA-C ?06/13/21 1736 ? ?

## 2021-06-15 LAB — CULTURE, GROUP A STREP (THRC)

## 2021-08-04 ENCOUNTER — Other Ambulatory Visit: Payer: Self-pay | Admitting: Nurse Practitioner

## 2021-08-16 ENCOUNTER — Other Ambulatory Visit: Payer: Self-pay | Admitting: Orthopedic Surgery

## 2021-08-16 DIAGNOSIS — M25462 Effusion, left knee: Secondary | ICD-10-CM

## 2021-08-25 ENCOUNTER — Telehealth: Payer: Self-pay

## 2021-08-25 ENCOUNTER — Ambulatory Visit (INDEPENDENT_AMBULATORY_CARE_PROVIDER_SITE_OTHER): Payer: BC Managed Care – PPO | Admitting: Nurse Practitioner

## 2021-08-25 ENCOUNTER — Encounter: Payer: Self-pay | Admitting: Nurse Practitioner

## 2021-08-25 VITALS — BP 117/77 | HR 97 | Ht 62.0 in | Wt 214.0 lb

## 2021-08-25 DIAGNOSIS — I10 Essential (primary) hypertension: Secondary | ICD-10-CM

## 2021-08-25 DIAGNOSIS — E1165 Type 2 diabetes mellitus with hyperglycemia: Secondary | ICD-10-CM

## 2021-08-25 DIAGNOSIS — E559 Vitamin D deficiency, unspecified: Secondary | ICD-10-CM

## 2021-08-25 LAB — POCT GLYCOSYLATED HEMOGLOBIN (HGB A1C): HbA1c POC (<> result, manual entry): 8.4 % (ref 4.0–5.6)

## 2021-08-25 MED ORDER — TIRZEPATIDE 5 MG/0.5ML ~~LOC~~ SOAJ: 5.0000 mg | SUBCUTANEOUS | 1 refills | Status: DC

## 2021-08-25 MED ORDER — TIRZEPATIDE 2.5 MG/0.5ML ~~LOC~~ SOAJ: 2.5000 mg | SUBCUTANEOUS | 0 refills | Status: DC

## 2021-08-25 NOTE — Telephone Encounter (Signed)
Pts insurance has denied Mounjaro. Can we try an alternative?

## 2021-08-25 NOTE — Progress Notes (Signed)
08/25/2021, 11:15 AM  Endocrinology follow-up note   Subjective:    Patient ID: Sonya Kline, female    DOB: Jun 27, 1970.  Sonya Kline is being seen in follow-up after she was seen in consultation for management of currently uncontrolled symptomatic diabetes requested by  Asencion Noble, MD.   Past Medical History:  Diagnosis Date   Diabetes mellitus, type II ([place])    Diverticulosis    HTN (hypertension)     Past Surgical History:  Procedure Laterality Date   ABDOMINAL HYSTERECTOMY     BALLOON DILATION N/A 06/07/2021   Procedure: BALLOON DILATION;  Surgeon: Eloise Harman, DO;  [facility];  Service: Endoscopy;  Laterality: N/A;   CESAREAN SECTION     x 2   COLONOSCOPY N/A 08/22/2019   Procedure: COLONOSCOPY;  Surgeon: Daneil Dolin, MD;  [facility];  Service: Endoscopy;  Laterality: N/A;  9:30   ESOPHAGOGASTRODUODENOSCOPY (EGD) WITH PROPOFOL N/A 06/07/2021   Procedure: ESOPHAGOGASTRODUODENOSCOPY (EGD) WITH PROPOFOL;  Surgeon: Eloise Harman, DO;  [facility];  Service: Endoscopy;  Laterality: N/A;  3:00pm   EXTRACORPOREAL SHOCK WAVE LITHOTRIPSY Right 02/15/2021   Procedure: EXTRACORPOREAL SHOCK WAVE LITHOTRIPSY (ESWL);  Surgeon: Primus Bravo., MD;  Location: [facility];  Service: Urology;  Laterality: Right;   POLYPECTOMY  08/22/2019   Procedure: POLYPECTOMY;  Surgeon: Daneil Dolin, MD;  [facility];  Service: Endoscopy;;    Social History   Socioeconomic History   Marital status: Married    Spouse name: Not on file   Number of children: Not on file   Years of education: college   Highest education level: Not on file  Occupational History   Not on file  Tobacco Use   Smoking status: Never   Smokeless tobacco: Never  Vaping Use   Vaping Use: Never used  Substance and Sexual Activity   Alcohol use: No   Drug use: No   Sexual  activity: Yes    Birth control/protection: None, Surgical  Other Topics Concern   Not on file  Social History Narrative   Not on file   Social Determinants of Health   Financial Resource Strain: Not on file  Food Insecurity: Not on file  Transportation Needs: Not on file  Physical Activity: Not on file  Stress: Not on file  Social Connections: Not on file    Family History  Problem Relation Age of Onset   Diabetes Mother    Hypertension Mother    Hypertension Father     Outpatient Encounter Medications as of 08/25/2021  Medication Sig   tirzepatide (MOUNJARO) 2.5 MG/0.5ML Pen Inject 2.5 mg into the skin once a week.   tirzepatide [facility]) 5 MG/0.5ML Pen Inject 5 mg into the skin once a week.   amLODipine (NORVASC) 10 MG tablet Take 10 mg by mouth daily.    BD PEN NEEDLE NANO 2ND GEN 32G X 4 MM MISC SMARTSIG:Pre-Filled Pen Syringe Injection Daily   insulin glargine (LANTUS SOLOSTAR) 100 UNIT/ML Solostar Pen Inject 50 Units into the skin at bedtime.   meloxicam (MOBIC) 7.5 MG tablet TAKE  1 TABLET(7.5 MG) BY MOUTH DAILY   rosuvastatin (CRESTOR) 5 MG tablet TAKE 1 TABLET(5 MG) BY MOUTH DAILY   [DISCONTINUED] amoxicillin (AMOXIL) 875 MG tablet Take 1 tablet (875 mg total) by mouth 2 (two) times daily.   [DISCONTINUED] glipiZIDE (GLUCOTROL XL) 5 MG 24 hr tablet TAKE 1 TABLET(5 MG) BY MOUTH DAILY WITH BREAKFAST   [DISCONTINUED] lidocaine (XYLOCAINE) 2 % solution Use as directed 10 mLs in the mouth or throat every 3 (three) hours as needed for mouth pain.   No facility-administered encounter medications on file as of 08/25/2021.    ALLERGIES: Allergies  Allergen Reactions   Contrast Media [Iodinated Contrast Media] Hives   Prednisone Rash    VACCINATION STATUS: Immunization History  Administered Date(s) Administered   Rabies, IM 03/17/2013, 03/20/2013, 03/27/2013, 04/11/2013   Tdap 03/17/2013    Diabetes She presents for her follow-up diabetic visit. She has type 2  diabetes mellitus. Onset time: She was diagnosed at approximate age of 46 years. Her disease course has been worsening. There are no hypoglycemic associated symptoms. Pertinent negatives for hypoglycemia include no confusion, headaches, pallor or seizures. Pertinent negatives for diabetes include no chest pain, no fatigue, no polydipsia, no polyphagia, no polyuria and no weight loss. There are no hypoglycemic complications. Symptoms are stable. There are no diabetic complications. Risk factors for coronary artery disease include diabetes mellitus, hypertension, obesity and dyslipidemia. Current diabetic treatment includes insulin injections and oral agent (monotherapy). She is compliant with treatment most of the time. Her weight is increasing steadily. She is following a generally unhealthy diet. When asked about meal planning, she reported none. She has not had a previous visit with a dietitian. She rarely participates in exercise. Her home blood glucose trend is increasing steadily. (She presents today with her logs, no meter, showing above target glycemic profile overall.  Her POCT A1c today is 8.4%, increasing from last visit of 7.6%.  She reports she has "been lazy" lately and not been exercising optimally.  She denies any hypoglycemia.) An ACE inhibitor/angiotensin II receptor blocker is not being taken. She does not see a podiatrist.Eye exam is current.  Hypertension This is a chronic problem. The current episode started more than 1 year ago. The problem has been resolved since onset. The problem is controlled. Pertinent negatives include no chest pain, headaches, palpitations or shortness of breath. There are no associated agents to hypertension. Risk factors for coronary artery disease include diabetes mellitus, family history, obesity, sedentary lifestyle and dyslipidemia. Past treatments include calcium channel blockers. The current treatment provides mild improvement. Compliance problems include diet  and exercise.   Hyperlipidemia This is a chronic problem. The current episode started more than 1 year ago. The problem is uncontrolled. Recent lipid tests were reviewed and are high. Exacerbating diseases include diabetes. There are no known factors aggravating her hyperlipidemia. Pertinent negatives include no chest pain or shortness of breath. Current antihyperlipidemic treatment includes statins. The current treatment provides mild improvement of lipids. Compliance problems include adherence to diet and adherence to exercise.  Risk factors for coronary artery disease include diabetes mellitus, dyslipidemia, hypertension, obesity and a sedentary lifestyle.    Review of systems  Constitutional: + steadily increasing body weight,  current Body mass index is 39.14 kg/m. , no fatigue, no subjective hyperthermia, no subjective hypothermia Eyes: no blurry vision, no xerophthalmia ENT: no sore throat, no nodules palpated in throat, no dysphagia/odynophagia, no hoarseness Cardiovascular: no chest pain, no shortness of breath, no palpitations, no leg swelling Respiratory:  no cough, no shortness of breath Gastrointestinal: no nausea/vomiting/diarrhea Musculoskeletal: no muscle/joint aches Skin: no rashes, no hyperemia Neurological: no tremors, no numbness, no tingling, no dizziness Psychiatric: no depression, no anxiety   Objective:    BP 117/77   Pulse 97   Ht '5\' 2"'$  (1.575 m)   Wt 214 lb (97.1 kg)   LMP 11/14/2011   BMI 39.14 kg/m    Wt Readings from Last 3 Encounters:  08/25/21 214 lb (97.1 kg)  06/13/21 198 lb 6.6 oz (90 kg)  06/07/21 200 lb (90.7 kg)    BP Readings from Last 3 Encounters:  08/25/21 117/77  06/13/21 127/80  06/07/21 (!) 96/53     Physical Exam- Limited  Constitutional:  Body mass index is 39.14 kg/m. , not in acute distress, normal state of mind Eyes:  EOMI, no exophthalmos Neck: Supple Cardiovascular: RRR, no murmurs, rubs, or gallops, no  edema Respiratory: Adequate breathing efforts, no crackles, rales, rhonchi, or wheezing Musculoskeletal: no gross deformities, strength intact in all four extremities, no gross restriction of joint movements Skin:  no rashes, no hyperemia Neurological: no tremor with outstretched hands   Diabetic Foot Exam - Simple   Simple Foot Form Diabetic Foot exam was performed with the following findings: Yes 08/25/2021 10:21 AM  Visual Inspection No deformities, no ulcerations, no other skin breakdown bilaterally: Yes Sensation Testing Intact to touch and monofilament testing bilaterally: Yes Pulse Check Posterior Tibialis and Dorsalis pulse intact bilaterally: Yes Comments     CMP ( most recent) CMP     Component Value Date/Time   NA 142 03/23/2021 1045   K 4.3 03/23/2021 1045   CL 103 03/23/2021 1045   CO2 25 03/23/2021 1045   GLUCOSE 114 (H) 03/23/2021 1045   GLUCOSE 239 (H) 02/01/2021 2320   BUN 15 03/23/2021 1045   CREATININE 0.87 03/23/2021 1045   CALCIUM 9.0 03/23/2021 1045   PROT 8.0 02/01/2021 2320   PROT 7.0 01/13/2021 0745   ALBUMIN 3.6 02/01/2021 2320   ALBUMIN 3.7 (L) 01/13/2021 0745   AST 20 02/01/2021 2320   ALT 17 02/01/2021 2320   ALKPHOS 74 02/01/2021 2320   BILITOT 0.3 02/01/2021 2320   BILITOT 0.2 01/13/2021 0745   GFRNONAA >60 02/01/2021 2320   GFRAA 102 01/21/2020 0737     Diabetic Labs (most recent): Lab Results  Component Value Date   HGBA1C 8.4 08/25/2021   HGBA1C 7.6 04/25/2021   HGBA1C 8.4 (A) 01/24/2021   MICROALBUR 30 01/29/2020        Assessment & Plan:   1) Type 2 diabetes mellitus with hyperglycemia (HCC)  - YAJAIRA DOFFING has currently uncontrolled symptomatic type 2 DM since 51 years of age.  Recent labs reviewed.   She presents today with her logs, no meter, showing above target glycemic profile overall.  Her POCT A1c today is 8.4%, increasing from last visit of 7.6%.  She reports she has "been lazy" lately and not been  exercising optimally.  She denies any hypoglycemia.  - I had a long discussion with her about the progressive nature of diabetes and the pathology behind its complications. -She does not report gross complications of diabetes, however she remains at a high risk for more acute and chronic complications which include CAD, CVA, CKD, retinopathy, and neuropathy. These are all discussed in detail with her.  - Nutritional counseling repeated at each appointment due to patients tendency to fall back in to old habits.  - The patient admits there  is a room for improvement in their diet and drink choices. -  Suggestion is made for the patient to avoid simple carbohydrates from their diet including Cakes, Sweet Desserts / Pastries, Ice Cream, Soda (diet and regular), Sweet Tea, Candies, Chips, Cookies, Sweet Pastries, Store Bought Juices, Alcohol in Excess of 1-2 drinks a day, Artificial Sweeteners, Coffee Creamer, and "Sugar-free" Products. This will help patient to have stable blood glucose profile and potentially avoid unintended weight gain.   - I encouraged the patient to switch to unprocessed or minimally processed complex starch and increased protein intake (animal or plant source), fruits, and vegetables.   - Patient is advised to stick to a routine mealtimes to eat 3 meals a day and avoid unnecessary snacks (to snack only to correct hypoglycemia).  - she is following with Jearld Fenton, RDN, CDE for diabetes education.  - I have approached her with the following individualized plan to manage  her diabetes and patient agrees:   -In light of her prevailing glycemic burden, she will continue to need insulin treatment in order for her to achieve and maintain control of diabetes to target.   -She is advised to continue her Lantus 50 units SQ nightly.  I discussed and initiated trial of Mounjaro 2.5 mg SQ weekly x 4 weeks, then increase to 5 mg SQ weekly thereafter if tolerated well.  She can stop her  Glipizide.    - she is encouraged to continue monitoring blood glucose twice daily, before breakfast and before bed, and to call the clinic if she has readings less than 70 or greater than 300 for 3 tests in a row.  - Specific targets for  A1c;  LDL, HDL,  and Triglycerides were discussed with the patient.  2) Blood Pressure /Hypertension:  Her blood pressure is controlled to target.  She is advised to continue Norvasc 10 mg po daily.    3) Lipids/Hyperlipidemia:  Her most recent lipid panel from 01/13/21 shows uncontrolled LDL at 107 (improving).   She is advised to continue Crestor 5 mg po daily before bed.  Side effects and precautions discussed with her.  Will recheck lipid panel prior to next visit.  4)  Weight/Diet:  Her Body mass index is 39.14 kg/m.  -   clearly complicating her diabetes care.   she is a candidate for weight loss. I discussed with her the fact that loss of 5 - 10% of her  current body weight will have the most impact on her diabetes management.  Exercise, and detailed carbohydrates information provided  -  detailed on discharge instructions.  5) Vitamin D Deficiency: Her recent vitamin D level from 01/13/21 was low at 15.6.  She is not currently on any supplementation.  I discussed and initiated replenishment with Ergocalciferol 50000 units po weekly x 12 weeks.  Will recheck vitamin D prior to next visit.  6) Chronic Care/Health Maintenance: -she is not on ACEI/ARB is on Statin medications and is encouraged to initiate and continue to follow up with Ophthalmology, Dentist,  Podiatrist at least yearly or according to recommendations, and advised to stay away from smoking. I have recommended yearly flu vaccine and pneumonia vaccine at least every 5 years; moderate intensity exercise for up to 150 minutes weekly; and  sleep for at least 7 hours a day.  - she is advised to maintain close follow up with Asencion Noble, MD for primary care needs, as well as her other providers for  optimal and coordinated care.  I spent 41 minutes in the care of the patient today including review of labs from [place], Lipids, Thyroid Function, Hematology (current and previous including abstractions from other facilities); face-to-face time discussing  her blood glucose readings/logs, discussing hypoglycemia and hyperglycemia episodes and symptoms, medications doses, her options of short and long term treatment based on the latest standards of care / guidelines;  discussion about incorporating lifestyle medicine;  and documenting the encounter. Risk reduction counseling performed per USPSTF guidelines to reduce obesity and cardiovascular risk factors.     Please refer to Patient Instructions for Blood Glucose Monitoring and Insulin/Medications Dosing Guide"  in media tab for additional information. Please  also refer to " Patient Self Inventory" in the Media  tab for reviewed elements of pertinent patient history.  [name] participated in the discussions, expressed understanding, and voiced agreement with the above plans.  All questions were answered to her satisfaction. she is encouraged to contact clinic should she have any questions or concerns prior to her return visit.    Follow up plan: - Return in about 3 months (around 11/25/2021) for Diabetes F/U with A1c in office, Previsit labs, Bring meter and logs.  Rayetta Pigg, [facility] [address] [place], [place] [address] Phone: [phone] Fax: [phone]  08/25/2021, 11:15 AM

## 2021-08-26 MED ORDER — TRULICITY 1.5 MG/0.5ML ~~LOC~~ SOAJ: 1.5000 mg | SUBCUTANEOUS | 3 refills | Status: DC

## 2021-08-26 MED ORDER — OZEMPIC (0.25 OR 0.5 MG/DOSE) 2 MG/1.5ML ~~LOC~~ SOPN: 0.5000 mg | PEN_INJECTOR | SUBCUTANEOUS | 3 refills | Status: DC

## 2021-08-26 MED ORDER — TRULICITY 0.75 MG/0.5ML ~~LOC~~ SOAJ: 0.7500 mg | SUBCUTANEOUS | 0 refills | Status: DC

## 2021-08-26 NOTE — Telephone Encounter (Signed)
I sent in for Ozempic for her.

## 2021-08-26 NOTE — Telephone Encounter (Signed)
Pt called pharmacy. Her copay for the Ozempic is $500.

## 2021-08-26 NOTE — Telephone Encounter (Signed)
Pt.notified

## 2021-08-26 NOTE — Telephone Encounter (Signed)
YIKES!  Ok, ill try Trulicity then.  Ive already sent it in.  She may need to end up using a copay card or something.

## 2021-08-26 NOTE — Addendum Note (Signed)
Addended by: Brita Romp on: 08/26/2021 09:49 AM   Modules accepted: Orders

## 2021-08-30 NOTE — Telephone Encounter (Signed)
Called in Dx code to [facility]

## 2021-08-30 NOTE — Telephone Encounter (Signed)
Patient said she needs a PA on the Trulicity and also she needs a DX code for the [facility] to use the discount card

## 2021-10-09 NOTE — Progress Notes (Deleted)
GI Office Note    Referring Provider: Asencion Noble, MD Primary Care Physician:  Asencion Noble, MD  Primary Gastroenterologist:  Chief Complaint   No chief complaint on file.   History of Present Illness   Sonya Kline is a 51 y.o. female presenting today     EGD 05/2021: - Z-line regular, 35 cm from the incisors. - Mild Schatzki ring. Dilated. - Normal stomach. - Normal duodenal bulb, first portion of the duodenum and second portion of the duodenum. - No specimens collected.    Medications   Current Outpatient Medications  Medication Sig Dispense Refill   amLODipine (NORVASC) 10 MG tablet Take 10 mg by mouth daily.      BD PEN NEEDLE NANO 2ND GEN 32G X 4 MM MISC SMARTSIG:Pre-Filled Pen Syringe Injection Daily     Dulaglutide (TRULICITY) 3.55 DD/2.2GU SOPN Inject 0.75 mg into the skin once a week. 1 mL 0   Dulaglutide (TRULICITY) 1.5 RK/2.7CW SOPN Inject 1.5 mg into the skin once a week. 2 mL 3   insulin glargine (LANTUS SOLOSTAR) 100 UNIT/ML Solostar Pen Inject 50 Units into the skin at bedtime. 45 mL 3   meloxicam (MOBIC) 7.5 MG tablet TAKE 1 TABLET(7.5 MG) BY MOUTH DAILY 30 tablet 5   rosuvastatin (CRESTOR) 5 MG tablet TAKE 1 TABLET(5 MG) BY MOUTH DAILY 90 tablet 1   No current facility-administered medications for this visit.    Allergies   Allergies as of 10/10/2021 - Review Complete 08/25/2021  Allergen Reaction Noted   Contrast media [iodinated contrast media] Hives 11/21/2011   Prednisone Rash 12/20/2013     Past Medical History   Past Medical History:  Diagnosis Date   Diabetes mellitus, type II ([place])    Diverticulosis    HTN (hypertension)     Past Surgical History   Past Surgical History:  Procedure Laterality Date   ABDOMINAL HYSTERECTOMY     BALLOON DILATION N/A 06/07/2021   Procedure: BALLOON DILATION;  Surgeon: Eloise Harman, DO;  [facility];  Service: Endoscopy;  Laterality: N/A;   CESAREAN SECTION     x 2    COLONOSCOPY N/A 08/22/2019   Procedure: COLONOSCOPY;  Surgeon: Daneil Dolin, MD;  [facility];  Service: Endoscopy;  Laterality: N/A;  9:30   ESOPHAGOGASTRODUODENOSCOPY (EGD) WITH PROPOFOL N/A 06/07/2021   Procedure: ESOPHAGOGASTRODUODENOSCOPY (EGD) WITH PROPOFOL;  Surgeon: Eloise Harman, DO;  [facility];  Service: Endoscopy;  Laterality: N/A;  3:00pm   EXTRACORPOREAL SHOCK WAVE LITHOTRIPSY Right 02/15/2021   Procedure: EXTRACORPOREAL SHOCK WAVE LITHOTRIPSY (ESWL);  Surgeon: Primus Bravo., MD;  Location: [facility];  Service: Urology;  Laterality: Right;   POLYPECTOMY  08/22/2019   Procedure: POLYPECTOMY;  Surgeon: Daneil Dolin, MD;  [facility];  Service: Endoscopy;;    Past Family History   Family History  Problem Relation Age of Onset   Diabetes Mother    Hypertension Mother    Hypertension Father     Past Social History   Social History   Socioeconomic History   Marital status: Married    Spouse name: Not on file   Number of children: Not on file   Years of education: college   Highest education level: Not on file  Occupational History   Not on file  Tobacco Use   Smoking status: Never   Smokeless tobacco: Never  Vaping Use   Vaping Use: Never used  Substance and Sexual  Activity   Alcohol use: No   Drug use: No   Sexual activity: Yes    Birth control/protection: None, Surgical  Other Topics Concern   Not on file  Social History Narrative   Not on file   Social Determinants of Health   Financial Resource Strain: Not on file  Food Insecurity: Not on file  Transportation Needs: Not on file  Physical Activity: Not on file  Stress: Not on file  Social Connections: Not on file  Intimate Partner Violence: Not on file    Review of Systems   General: Negative for anorexia, weight loss, fever, chills, fatigue, weakness. ENT: Negative for hoarseness, difficulty swallowing , nasal congestion. CV: Negative for chest pain,  angina, palpitations, dyspnea on exertion, peripheral edema.  Respiratory: Negative for dyspnea at rest, dyspnea on exertion, cough, sputum, wheezing.  GI: See history of present illness. GU:  Negative for dysuria, hematuria, urinary incontinence, urinary frequency, nocturnal urination.  Endo: Negative for unusual weight change.     Physical Exam   LMP 11/14/2011    General: Well-nourished, well-developed in no acute distress.  Eyes: No icterus. Mouth: Oropharyngeal mucosa moist and pink , no lesions erythema or exudate. Lungs: Clear to auscultation bilaterally.  Heart: Regular rate and rhythm, no murmurs rubs or gallops.  Abdomen: Bowel sounds are normal, nontender, nondistended, no hepatosplenomegaly or masses,  no abdominal bruits or hernia , no rebound or guarding.  Rectal: ***  Extremities: No lower extremity edema. No clubbing or deformities. Neuro: Alert and oriented x 4   Skin: Warm and dry, no jaundice.   Psych: Alert and cooperative, normal mood and affect.  Labs   Lab Results  Component Value Date   CREATININE 0.87 03/23/2021   BUN 15 03/23/2021   NA 142 03/23/2021   K 4.3 03/23/2021   CL 103 03/23/2021   CO2 25 03/23/2021   Lab Results  Component Value Date   WBC 21.8 (H) 02/01/2021   HGB 12.6 02/01/2021   HCT 40.6 02/01/2021   MCV 81.0 02/01/2021   PLT 390 02/01/2021   Lab Results  Component Value Date   ALT 17 02/01/2021   AST 20 02/01/2021   ALKPHOS 74 02/01/2021   BILITOT 0.3 02/01/2021   Lab Results  Component Value Date   TSH 1.370 01/13/2021   Lab Results  Component Value Date   HGBA1C 8.4 08/25/2021    Imaging Studies   No results found.  Assessment       PLAN   ***   Laureen Ochs. Bobby Rumpf, [place]

## 2021-10-10 ENCOUNTER — Ambulatory Visit: Payer: BC Managed Care – PPO | Admitting: Gastroenterology

## 2021-10-10 ENCOUNTER — Ambulatory Visit (INDEPENDENT_AMBULATORY_CARE_PROVIDER_SITE_OTHER): Payer: BC Managed Care – PPO | Admitting: Urology

## 2021-10-10 ENCOUNTER — Ambulatory Visit (HOSPITAL_COMMUNITY)
Admission: RE | Admit: 2021-10-10 | Discharge: 2021-10-10 | Disposition: A | Discharge status: Home / Self Care | Payer: BC Managed Care – PPO | Source: Ambulatory Visit | Attending: Urology | Admitting: Urology

## 2021-10-10 ENCOUNTER — Encounter: Payer: Self-pay | Admitting: Urology

## 2021-10-10 VITALS — BP 108/67 | HR 89

## 2021-10-10 DIAGNOSIS — N200 Calculus of kidney: Secondary | ICD-10-CM

## 2021-10-10 DIAGNOSIS — I878 Other specified disorders of veins: Secondary | ICD-10-CM | POA: Diagnosis not present

## 2021-10-10 NOTE — Patient Instructions (Signed)

## 2021-10-10 NOTE — Progress Notes (Signed)
10/10/2021 11:20 AM   [name] 1970/05/05 [id number]  Referring provider: Asencion Noble, MD [address] [place],  [place] [address]  nephrolithiasis   HPI: Sonya Kline is a 51yo here for followup for nephrolithiasis. No stone events sine last visit. No flank pain. No significant LUTS. She drinks over 60oz of water daily. KUb from today shows stable left lower pole calculus.    PMH: Past Medical History:  Diagnosis Date   Diabetes mellitus, type II ([place])    Diverticulosis    HTN (hypertension)     Surgical History: Past Surgical History:  Procedure Laterality Date   ABDOMINAL HYSTERECTOMY     BALLOON DILATION N/A 06/07/2021   Procedure: BALLOON DILATION;  Surgeon: Eloise Harman, DO;  [facility];  Service: Endoscopy;  Laterality: N/A;   CESAREAN SECTION     x 2   COLONOSCOPY N/A 08/22/2019   Procedure: COLONOSCOPY;  Surgeon: Daneil Dolin, MD;  [facility];  Service: Endoscopy;  Laterality: N/A;  9:30   ESOPHAGOGASTRODUODENOSCOPY (EGD) WITH PROPOFOL N/A 06/07/2021   Procedure: ESOPHAGOGASTRODUODENOSCOPY (EGD) WITH PROPOFOL;  Surgeon: Eloise Harman, DO;  [facility];  Service: Endoscopy;  Laterality: N/A;  3:00pm   EXTRACORPOREAL SHOCK WAVE LITHOTRIPSY Right 02/15/2021   Procedure: EXTRACORPOREAL SHOCK WAVE LITHOTRIPSY (ESWL);  Surgeon: Primus Bravo., MD;  Location: [facility];  Service: Urology;  Laterality: Right;   POLYPECTOMY  08/22/2019   Procedure: POLYPECTOMY;  Surgeon: Daneil Dolin, MD;  [facility];  Service: Endoscopy;;    Home Medications:  Allergies as of 10/10/2021       Reactions   Contrast Media [iodinated Contrast Media] Hives   Prednisone Rash        Medication List        Accurate as of October 10, 2021 11:20 AM. If you have any questions, ask your nurse or doctor.          amLODipine 10 MG tablet Commonly known as: NORVASC Take 10 mg by mouth daily.   BD Pen Needle Nano  2nd Gen 32G X 4 MM Misc Generic drug: Insulin Pen Needle SMARTSIG:Pre-Filled Pen Syringe Injection Daily   Lantus SoloStar 100 UNIT/ML Solostar Pen Generic drug: insulin glargine Inject 50 Units into the skin at bedtime.   meloxicam 7.5 MG tablet Commonly known as: MOBIC TAKE 1 TABLET(7.5 MG) BY MOUTH DAILY   rosuvastatin 5 MG tablet Commonly known as: CRESTOR TAKE 1 TABLET(5 MG) BY MOUTH DAILY   Trulicity 9.21 JH/4.1DE Sopn Generic drug: Dulaglutide Inject 0.75 mg into the skin once a week.   Trulicity 1.5 YC/1.4GY Sopn Generic drug: Dulaglutide Inject 1.5 mg into the skin once a week.        Allergies:  Allergies  Allergen Reactions   Contrast Media [Iodinated Contrast Media] Hives   Prednisone Rash    Family History: Family History  Problem Relation Age of Onset   Diabetes Mother    Hypertension Mother    Hypertension Father     Social History:  reports that she has never smoked. She has never used smokeless tobacco. She reports that she does not drink alcohol and does not use drugs.  ROS: All other review of systems were reviewed and are negative except what is noted above in HPI  Physical Exam: BP 108/67   Pulse 89   LMP 11/14/2011   Constitutional:  Alert and oriented, No acute distress. HEENT: [place] AT, moist mucus membranes.  Trachea midline, no masses. Cardiovascular: No clubbing, cyanosis, or edema. Respiratory: Normal respiratory effort, no increased work of breathing. GI: Abdomen is soft, nontender, nondistended, no abdominal masses GU: No CVA tenderness.  Lymph: No cervical or inguinal lymphadenopathy. Skin: No rashes, bruises or suspicious lesions. Neurologic: Grossly intact, no focal deficits, moving all 4 extremities. Psychiatric: Normal mood and affect.  Laboratory Data: Lab Results  Component Value Date   WBC 21.8 (H) 02/01/2021   HGB 12.6 02/01/2021   HCT 40.6 02/01/2021   MCV 81.0 02/01/2021   PLT 390 02/01/2021    Lab Results   Component Value Date   CREATININE 0.87 03/23/2021    No results found for: "PSA"  No results found for: "TESTOSTERONE"  Lab Results  Component Value Date   HGBA1C 8.4 08/25/2021    Urinalysis    Component Value Date/Time   COLORURINE YELLOW 02/01/2021 2318   APPEARANCEUR Clear 04/12/2021 1006   LABSPEC 1.015 02/01/2021 2318   PHURINE 7.0 02/01/2021 2318   GLUCOSEU Negative 04/12/2021 1006   HGBUR LARGE (A) 02/01/2021 2318   BILIRUBINUR Negative 04/12/2021 1006   KETONESUR NEGATIVE 02/01/2021 2318   PROTEINUR 1+ (A) 04/12/2021 1006   PROTEINUR NEGATIVE 02/01/2021 2318   UROBILINOGEN 0.2 10/17/2008 1602   NITRITE Negative 04/12/2021 1006   NITRITE NEGATIVE 02/01/2021 2318   LEUKOCYTESUR Negative 04/12/2021 1006   LEUKOCYTESUR NEGATIVE 02/01/2021 2318    Lab Results  Component Value Date   LABMICR See below: 04/12/2021   WBCUA 0-5 04/12/2021   LABEPIT >10 (A) 04/12/2021   MUCUS Present 04/12/2021   BACTERIA Few 04/12/2021    Pertinent Imaging: KUb today: Images reviewed and discussed with the patient  Results for orders placed during the hospital encounter of 03/01/21  DG Abd 1 View  Narrative CLINICAL DATA:  Post extracorporeal shockwave lithotripsy for right-sided stone 2 weeks ago.  EXAM: ABDOMEN - 1 VIEW  COMPARISON:  KUB 02/15/2021 CT abdomen and pelvis 02/03/2021  FINDINGS: On the prior CT and radiographs, a right ureteral stone was seen at the level of the L4-5 disc. This is no longer visualized in this region. There are numerous vascular phleboliths overlying the right-greater-than-left aspect of the pelvis. These appear similar to 02/15/2021 and the ureteral stone is not definitely visualized in this region.  A 6 mm calcification overlies the lower pole of the left kidney corresponding to the nonobstructing stone seen on prior CT.  Nonobstructive bowel-gas pattern. No acute skeletal abnormality. The lung bases are clear.  IMPRESSION: The  previously seen stone within the distal right ureter at the L4-5 disc level is no longer visualized. Numerous vascular phleboliths overlying the pelvis appears similar to prior and cannot definitively see the ureteral stone advanced into this region. Question whether this stone has cleared.  Unchanged left lower pole 6 mm renal stone.   Electronically Signed By: Yvonne Kendall M.D. On: 03/02/2021 09:15  No results found for this or any previous visit.  No results found for this or any previous visit.  No results found for this or any previous visit.  Results for orders placed during the hospital encounter of 03/28/21  Ultrasound renal complete  Narrative CLINICAL DATA:  Nephrolithiasis post lithotripsy.  EXAM: RENAL / URINARY TRACT ULTRASOUND COMPLETE  COMPARISON:  04/24/2018 and CT 02/01/2021  FINDINGS: Right Kidney:  Renal measurements: 10.4 x 5 1 x 6.4 cm = volume: 179 mL. Echogenicity within normal limits. No mass or hydronephrosis visualized.  Left Kidney:  Renal measurements: 10.1 x  6.5 x 6.5 cm = volume: 223 ML. Echogenicity within normal limits. No mass or hydronephrosis visualized. 8 mm stone over the lower pole unchanged from prior CT.  Bladder:  Appears normal for degree of bladder distention. Right ureteral jet visualized.  Other:  None.  IMPRESSION: Normal size kidneys without hydronephrosis. 8 mm stone over the lower pole left kidney unchanged.   Electronically Signed By: Marin Olp M.D. On: 03/28/2021 15:51  No results found for this or any previous visit.  No results found for this or any previous visit.  Results for orders placed during the hospital encounter of 02/01/21  CT RENAL STONE STUDY  Narrative CLINICAL DATA:  Right flank pain.  EXAM: CT ABDOMEN AND PELVIS WITHOUT CONTRAST  TECHNIQUE: Multidetector CT imaging of the abdomen and pelvis was performed following the standard protocol without IV  contrast.  COMPARISON:  April 23, 2018  FINDINGS: Lower chest: No acute abnormality.  Hepatobiliary: No focal liver abnormality is seen. No gallstones, gallbladder wall thickening, or biliary dilatation.  Pancreas: Unremarkable. No pancreatic ductal dilatation or surrounding inflammatory changes.  Spleen: Normal in size without focal abnormality.  Adrenals/Urinary Tract: Adrenal glands are unremarkable. Kidneys are normal in size, without focal lesions. A 6 mm nonobstructing renal stone is seen within the mid to lower left kidney. A 2 mm nonobstructing renal stone is seen within the anterior aspect of the mid to lower right kidney. There is a 5 mm obstructing renal stone within the proximal to mid right ureter, with mild to moderate severity right-sided hydronephrosis, hydroureter and perinephric inflammatory fat stranding. Bladder is unremarkable.  Stomach/Bowel: Stomach is within normal limits. Appendix appears normal. No evidence of bowel wall thickening, distention, or inflammatory changes. Noninflamed diverticula are seen throughout the large bowel.  Vascular/Lymphatic: No significant vascular findings are present. No enlarged abdominal or pelvic lymph nodes.  Reproductive: The uterus is surgically absent. A 5.7 cm x 4.5 cm cyst is seen along the anterior aspect of the left adnexa.  Other: No abdominal wall hernia or abnormality. No abdominopelvic ascites.  Musculoskeletal: No acute or significant osseous findings.  IMPRESSION: 1. 5 mm obstructing renal stone within the proximal to mid right ureter. 2. Bilateral nonobstructing renal calculi. 3. Colonic diverticulosis. 4. 5.7 cm x 4.5 cm left adnexal cyst, likely ovarian in origin. Correlation with pelvic ultrasound is recommended.   Electronically Signed By: Virgina Norfolk M.D. On: 02/01/2021 23:55   Assessment & Plan:    1. Kidney stones -RTC 1 year with KUB - Urinalysis, Routine w reflex  microscopic   No follow-ups on file.  Nicolette Bang, MD  [facility] [place]

## 2021-10-15 ENCOUNTER — Other Ambulatory Visit: Payer: Self-pay | Admitting: Nurse Practitioner

## 2021-10-26 ENCOUNTER — Ambulatory Visit: Payer: BC Managed Care – PPO | Admitting: Gastroenterology

## 2021-11-14 ENCOUNTER — Other Ambulatory Visit: Payer: Self-pay | Admitting: Nurse Practitioner

## 2021-11-14 DIAGNOSIS — E1165 Type 2 diabetes mellitus with hyperglycemia: Secondary | ICD-10-CM

## 2021-11-24 DIAGNOSIS — E559 Vitamin D deficiency, unspecified: Secondary | ICD-10-CM | POA: Diagnosis not present

## 2021-11-24 DIAGNOSIS — E1165 Type 2 diabetes mellitus with hyperglycemia: Secondary | ICD-10-CM | POA: Diagnosis not present

## 2021-11-25 LAB — COMPREHENSIVE METABOLIC PANEL
ALT: 16 IU/L (ref 0–32)
AST: 21 IU/L (ref 0–40)
Albumin/Globulin Ratio: 1.3 (ref 1.2–2.2)
Albumin: 4 g/dL (ref 3.8–4.9)
Alkaline Phosphatase: 87 IU/L (ref 44–121)
BUN/Creatinine Ratio: 18 (ref 9–23)
BUN: 14 mg/dL (ref 6–24)
Bilirubin Total: <0.2 mg/dL (ref 0.0–1.2)
CO2: 26 mmol/L (ref 20–29)
Calcium: 9.1 mg/dL (ref 8.7–10.2)
Chloride: 101 mmol/L (ref 96–106)
Creatinine, Ser: 0.79 mg/dL (ref 0.57–1.00)
Globulin, Total: 3.2 g/dL (ref 1.5–4.5)
Glucose: 98 mg/dL (ref 70–99)
Potassium: 4.1 mmol/L (ref 3.5–5.2)
Sodium: 142 mmol/L (ref 134–144)
Total Protein: 7.2 g/dL (ref 6.0–8.5)
eGFR: 89 mL/min/{1.73_m2} (ref >59)

## 2021-11-25 LAB — LIPID PANEL
Chol/HDL Ratio: 3.9 ratio (ref 0.0–4.4)
Cholesterol, Total: 165 mg/dL (ref 100–199)
HDL: 42 mg/dL (ref >39)
LDL Chol Calc (NIH): 105 mg/dL — ABNORMAL HIGH (ref 0–99)
Triglycerides: 97 mg/dL (ref 0–149)
VLDL Cholesterol Cal: 18 mg/dL (ref 5–40)

## 2021-11-25 LAB — TSH: TSH: 1.44 u[IU]/mL (ref 0.450–4.500)

## 2021-11-25 LAB — VITAMIN D 25 HYDROXY (VIT D DEFICIENCY, FRACTURES): Vit D, 25-Hydroxy: 26.7 ng/mL — ABNORMAL LOW (ref 30.0–100.0)

## 2021-11-25 LAB — T4, FREE: Free T4: 1.07 ng/dL (ref 0.82–1.77)

## 2021-11-29 ENCOUNTER — Encounter: Payer: Self-pay | Admitting: Nurse Practitioner

## 2021-11-29 ENCOUNTER — Ambulatory Visit (INDEPENDENT_AMBULATORY_CARE_PROVIDER_SITE_OTHER): Payer: BC Managed Care – PPO | Admitting: Nurse Practitioner

## 2021-11-29 VITALS — BP 110/72 | HR 102 | Ht 62.0 in | Wt 212.6 lb

## 2021-11-29 DIAGNOSIS — E1165 Type 2 diabetes mellitus with hyperglycemia: Secondary | ICD-10-CM | POA: Diagnosis not present

## 2021-11-29 DIAGNOSIS — I10 Essential (primary) hypertension: Secondary | ICD-10-CM

## 2021-11-29 DIAGNOSIS — E559 Vitamin D deficiency, unspecified: Secondary | ICD-10-CM | POA: Diagnosis not present

## 2021-11-29 LAB — POCT GLYCOSYLATED HEMOGLOBIN (HGB A1C): Hemoglobin A1C: 6.9 % — AB (ref 4.0–5.6)

## 2021-11-29 MED ORDER — TRULICITY 4.5 MG/0.5ML ~~LOC~~ SOAJ: 4.5000 mg | SUBCUTANEOUS | 3 refills | Status: DC

## 2021-11-29 NOTE — Progress Notes (Signed)
11/29/2021, 12:22 PM  Endocrinology follow-up note   Subjective:    Patient ID: Sonya Kline, female    DOB: [birth year].  Sonya Kline is being seen in follow-up after she was seen in consultation for management of currently uncontrolled symptomatic diabetes requested by  Asencion Noble, MD.   Past Medical History:  Diagnosis Date   Diabetes mellitus, type II ([place])    Diverticulosis    HTN (hypertension)     Past Surgical History:  Procedure Laterality Date   ABDOMINAL HYSTERECTOMY     BALLOON DILATION N/A 06/07/2021   Procedure: BALLOON DILATION;  Surgeon: Eloise Harman, DO;  [facility];  Service: Endoscopy;  Laterality: N/A;   CESAREAN SECTION     x 2   COLONOSCOPY N/A 08/22/2019   Procedure: COLONOSCOPY;  Surgeon: Daneil Dolin, MD;  [facility];  Service: Endoscopy;  Laterality: N/A;  9:30   ESOPHAGOGASTRODUODENOSCOPY (EGD) WITH PROPOFOL N/A 06/07/2021   Procedure: ESOPHAGOGASTRODUODENOSCOPY (EGD) WITH PROPOFOL;  Surgeon: Eloise Harman, DO;  [facility];  Service: Endoscopy;  Laterality: N/A;  3:00pm   EXTRACORPOREAL SHOCK WAVE LITHOTRIPSY Right 02/15/2021   Procedure: EXTRACORPOREAL SHOCK WAVE LITHOTRIPSY (ESWL);  Surgeon: Primus Bravo., MD;  Location[facility];  Service: Urology;  Laterality: Right;   POLYPECTOMY  08/22/2019   Procedure: POLYPECTOMY;  Surgeon: Daneil Dolin, MD;  [facility];  Service: Endoscopy;;    Social History   Socioeconomic History   Marital status: Married    Spouse name: Not on file   Number of children: Not on file   Years of education: college   Highest education level: Not on file  Occupational History   Not on file  Tobacco Use   Smoking status: Never   Smokeless tobacco: Never  Vaping Use   Vaping Use: Never used  Substance and Sexual Activity   Alcohol use: No   Drug use: No   Sexual  activity: Yes    Birth control/protection: None, Surgical  Other Topics Concern   Not on file  Social History Narrative   Not on file   Social Determinants of Health   Financial Resource Strain: Not on file  Food Insecurity: Not on file  Transportation Needs: Not on file  Physical Activity: Not on file  Stress: Not on file  Social Connections: Not on file    Family History  Problem Relation Age of Onset   Diabetes Mother    Hypertension Mother    Hypertension Father     Outpatient Encounter Medications as of 11/29/2021  Medication Sig   amLODipine (NORVASC) 10 MG tablet Take 10 mg by mouth daily.    BD PEN NEEDLE NANO 2ND GEN 32G X 4 MM MISC SMARTSIG:Pre-Filled Pen Syringe Injection Daily   Dulaglutide (TRULICITY) 4.5 JT/7.0VX SOPN Inject 4.5 mg as directed once a week.   meloxicam (MOBIC) 7.5 MG tablet TAKE 1 TABLET(7.5 MG) BY MOUTH DAILY   rosuvastatin (CRESTOR) 5 MG tablet TAKE 1 TABLET(5 MG) BY MOUTH DAILY   [DISCONTINUED] Dulaglutide (TRULICITY) 1.5 BL/3.9QZ SOPN Inject 1.5 mg into the skin once  a week.   [DISCONTINUED] insulin glargine (LANTUS SOLOSTAR) 100 UNIT/ML Solostar Pen Inject 50 Units into the skin at bedtime.   [DISCONTINUED] Dulaglutide (TRULICITY) 1.61 WR/6.0AV SOPN Inject 0.75 mg into the skin once a week. (Patient not taking: Reported on 11/29/2021)   No facility-administered encounter medications on file as of 11/29/2021.    ALLERGIES: Allergies  Allergen Reactions   Contrast Media [Iodinated Contrast Media] Hives   Prednisone Rash    VACCINATION STATUS: Immunization History  Administered Date(s) Administered   Rabies, IM 03/17/2013, 03/20/2013, 03/27/2013, 04/11/2013   Tdap 03/17/2013    Diabetes She presents for her follow-up diabetic visit. She has type 2 diabetes mellitus. Onset time: She was diagnosed at approximate age of 51 years. Her disease course has been improving. There are no hypoglycemic associated symptoms. Pertinent negatives  for hypoglycemia include no confusion, headaches, pallor or seizures. Pertinent negatives for diabetes include no chest pain, no fatigue, no polydipsia, no polyphagia, no polyuria and no weight loss. There are no hypoglycemic complications. Symptoms are stable. There are no diabetic complications. Risk factors for coronary artery disease include diabetes mellitus, hypertension, obesity and dyslipidemia. Current diabetic treatments: Trulicity. She is compliant with treatment most of the time. Her weight is fluctuating minimally. She is following a generally healthy diet. When asked about meal planning, she reported none. She has not had a previous visit with a dietitian. She rarely participates in exercise. Her home blood glucose trend is decreasing steadily. (She presents today with her logs, no meter, showing at goal glycemic profile overall.  Her POCT A1c today is 6.9%, improving from last visit of 8.4%.  She stopped taking her Lantus after last visit.  She has tolerated the Trulicity well, denies any SE.) An ACE inhibitor/angiotensin II receptor blocker is not being taken. She does not see a podiatrist.Eye exam is current.  Hypertension This is a chronic problem. The current episode started more than 1 year ago. The problem has been resolved since onset. The problem is controlled. Pertinent negatives include no chest pain, headaches, palpitations or shortness of breath. There are no associated agents to hypertension. Risk factors for coronary artery disease include diabetes mellitus, family history, obesity, sedentary lifestyle and dyslipidemia. Past treatments include calcium channel blockers. The current treatment provides mild improvement. Compliance problems include diet and exercise.   Hyperlipidemia This is a chronic problem. The current episode started more than 1 year ago. The problem is uncontrolled. Recent lipid tests were reviewed and are high. Exacerbating diseases include diabetes. There are no  known factors aggravating her hyperlipidemia. Pertinent negatives include no chest pain or shortness of breath. Current antihyperlipidemic treatment includes statins. The current treatment provides mild improvement of lipids. Compliance problems include adherence to diet and adherence to exercise.  Risk factors for coronary artery disease include diabetes mellitus, dyslipidemia, hypertension, obesity and a sedentary lifestyle.    Review of systems  Constitutional: + slowly decreasing body weight,  current Body mass index is 38.89 kg/m. , no fatigue, no subjective hyperthermia, no subjective hypothermia Eyes: no blurry vision, no xerophthalmia ENT: no sore throat, no nodules palpated in throat, no dysphagia/odynophagia, no hoarseness Cardiovascular: no chest pain, no shortness of breath, no palpitations, no leg swelling Respiratory: no cough, no shortness of breath Gastrointestinal: no nausea/vomiting/diarrhea Musculoskeletal: no muscle/joint aches Skin: no rashes, no hyperemia Neurological: no tremors, no numbness, no tingling, no dizziness Psychiatric: no depression, no anxiety   Objective:    BP 110/72 (BP Location: Left Arm, Patient Position: Sitting, Cuff  Size: Large)   Pulse (!) 102   Ht '5\' 2"'$  (1.575 m)   Wt 212 lb 9.6 oz (96.4 kg)   LMP 11/14/2011   BMI 38.89 kg/m    Wt Readings from Last 3 Encounters:  11/29/21 212 lb 9.6 oz (96.4 kg)  08/25/21 214 lb (97.1 kg)  06/13/21 198 lb 6.6 oz (90 kg)    BP Readings from Last 3 Encounters:  11/29/21 110/72  10/10/21 108/67  08/25/21 117/77     Physical Exam- Limited  Constitutional:  Body mass index is 38.89 kg/m. , not in acute distress, normal state of mind Eyes:  EOMI, no exophthalmos Neck: Supple Cardiovascular: RRR, no murmurs, rubs, or gallops, no edema Respiratory: Adequate breathing efforts, no crackles, rales, rhonchi, or wheezing Musculoskeletal: no gross deformities, strength intact in all four extremities, no  gross restriction of joint movements Skin:  no rashes, no hyperemia Neurological: no tremor with outstretched hands   Diabetic Foot Exam - Simple   No data filed     CMP ( most recent) CMP     Component Value Date/Time   NA 142 11/24/2021 0744   K 4.1 11/24/2021 0744   CL 101 11/24/2021 0744   CO2 26 11/24/2021 0744   GLUCOSE 98 11/24/2021 0744   GLUCOSE 239 (H) 02/01/2021 2320   BUN 14 11/24/2021 0744   CREATININE 0.79 11/24/2021 0744   CALCIUM 9.1 11/24/2021 0744   PROT 7.2 11/24/2021 0744   ALBUMIN 4.0 11/24/2021 0744   AST 21 11/24/2021 0744   ALT 16 11/24/2021 0744   ALKPHOS 87 11/24/2021 0744   BILITOT <0.2 11/24/2021 0744   GFRNONAA >60 02/01/2021 2320   GFRAA 102 01/21/2020 0737     Diabetic Labs (most recent): Lab Results  Component Value Date   HGBA1C 6.9 (A) 11/29/2021   HGBA1C 8.4 08/25/2021   HGBA1C 7.6 04/25/2021   MICROALBUR 30 01/29/2020        Assessment & Plan:   1) Type 2 diabetes mellitus with hyperglycemia (HCC)  - Sonya Kline has currently uncontrolled symptomatic type 2 DM since 51 years of age.  Recent labs reviewed.   She presents today with her logs, no meter, showing at goal glycemic profile overall.  Her POCT A1c today is 6.9%, improving from last visit of 8.4%.  She stopped taking her Lantus after last visit.  She has tolerated the Trulicity well, denies any SE.  - I had a long discussion with her about the progressive nature of diabetes and the pathology behind its complications. -She does not report gross complications of diabetes, however she remains at a high risk for more acute and chronic complications which include CAD, CVA, CKD, retinopathy, and neuropathy. These are all discussed in detail with her.  - Nutritional counseling repeated at each appointment due to patients tendency to fall back in to old habits.  - The patient admits there is a room for improvement in their diet and drink choices. -  Suggestion is made  for the patient to avoid simple carbohydrates from their diet including Cakes, Sweet Desserts / Pastries, Ice Cream, Soda (diet and regular), Sweet Tea, Candies, Chips, Cookies, Sweet Pastries, Store Bought Juices, Alcohol in Excess of 1-2 drinks a day, Artificial Sweeteners, Coffee Creamer, and "Sugar-free" Products. This will help patient to have stable blood glucose profile and potentially avoid unintended weight gain.   - I encouraged the patient to switch to unprocessed or minimally processed complex starch and increased protein intake (animal  or plant source), fruits, and vegetables.   - Patient is advised to stick to a routine mealtimes to eat 3 meals a day and avoid unnecessary snacks (to snack only to correct hypoglycemia).  - she is following with Jearld Fenton, RDN, CDE for diabetes education.  - I have approached her with the following individualized plan to manage  her diabetes and patient agrees:   -In light of her prevailing glycemic burden, she will continue to need insulin treatment in order for her to achieve and maintain control of diabetes to target.   -She is advised to increase her Trulicity to 3 mg SQ weekly (she can use 2 of her 1.5 mg injections on same day until she depletes her current supply), then will increase to 4.5 mg SQ weekly thereafter if tolerated well.    - she is encouraged to continue monitoring blood glucose at least once daily 3 times per week during the holidays to keep an eye on glucose levels.  - Specific targets for  A1c;  LDL, HDL,  and Triglycerides were discussed with the patient.  2) Blood Pressure /Hypertension:  Her blood pressure is controlled to target.  She is advised to continue Norvasc 10 mg po daily.    3) Lipids/Hyperlipidemia:  Her most recent lipid panel from 11/24/21 shows uncontrolled LDL at 107 (stable).   She is advised to continue Crestor 5 mg po daily before bed.  Side effects and precautions discussed with her.    4)   Weight/Diet:  Her Body mass index is 38.89 kg/m.  -   clearly complicating her diabetes care.   she is a candidate for weight loss. I discussed with her the fact that loss of 5 - 10% of her  current body weight will have the most impact on her diabetes management.  Exercise, and detailed carbohydrates information provided  -  detailed on discharge instructions.  5) Vitamin D Deficiency: Her recent vitamin D level from 11/24/21 was low at 26.7.  She is not currently on any supplementation.  She is advised to start OTC Vitamin D3 5000 units daily.  6) Chronic Care/Health Maintenance: -she is not on ACEI/ARB is on Statin medications and is encouraged to initiate and continue to follow up with Ophthalmology, Dentist,  Podiatrist at least yearly or according to recommendations, and advised to stay away from smoking. I have recommended yearly flu vaccine and pneumonia vaccine at least every 5 years; moderate intensity exercise for up to 150 minutes weekly; and  sleep for at least 7 hours a day.  - she is advised to maintain close follow up with Asencion Noble, MD for primary care needs, as well as her other providers for optimal and coordinated care.     I spent 40 minutes in the care of the patient today including review of labs from [place], Lipids, Thyroid Function, Hematology (current and previous including abstractions from other facilities); face-to-face time discussing  her blood glucose readings/logs, discussing hypoglycemia and hyperglycemia episodes and symptoms, medications doses, her options of short and long term treatment based on the latest standards of care / guidelines;  discussion about incorporating lifestyle medicine;  and documenting the encounter. Risk reduction counseling performed per USPSTF guidelines to reduce obesity and cardiovascular risk factors.     Please refer to Patient Instructions for Blood Glucose Monitoring and Insulin/Medications Dosing Guide"  in media tab for additional  information. Please  also refer to " Patient Self Inventory" in the Media  tab for reviewed  elements of pertinent patient history.  [name] participated in the discussions, expressed understanding, and voiced agreement with the above plans.  All questions were answered to her satisfaction. she is encouraged to contact clinic should she have any questions or concerns prior to her return visit.    Follow up plan: - Return in about 4 months (around 04/01/2022) for Diabetes F/U with A1c in office, No previsit labs.  Rayetta Pigg, [facility] [address] [place], [place] [address] Phone: [phone] Fax: [phone]  11/29/2021, 12:22 PM

## 2021-12-01 ENCOUNTER — Other Ambulatory Visit: Payer: Self-pay | Admitting: Nurse Practitioner

## 2022-01-30 ENCOUNTER — Telehealth: Payer: Self-pay | Admitting: *Deleted

## 2022-01-30 DIAGNOSIS — R7309 Other abnormal glucose: Secondary | ICD-10-CM | POA: Diagnosis not present

## 2022-01-30 DIAGNOSIS — J039 Acute tonsillitis, unspecified: Secondary | ICD-10-CM | POA: Diagnosis not present

## 2022-01-30 DIAGNOSIS — I10 Essential (primary) hypertension: Secondary | ICD-10-CM | POA: Diagnosis not present

## 2022-01-30 DIAGNOSIS — E1165 Type 2 diabetes mellitus with hyperglycemia: Secondary | ICD-10-CM | POA: Diagnosis not present

## 2022-01-30 DIAGNOSIS — Z23 Encounter for immunization: Secondary | ICD-10-CM | POA: Diagnosis not present

## 2022-01-30 NOTE — Telephone Encounter (Signed)
She will need to stop her Trulicity about 2 weeks prior to her surgery.  That medication causes food to sit in her stomach longer, and in surgery, they want an empty stomach to prevent chances of aspiration.

## 2022-01-30 NOTE — Telephone Encounter (Signed)
Ms. Sonya Kline called and states that she was just leaving her PCP, and was told that she was going to have to have her tonsils removed. The PCP ask that she ask you about her Trulicity,  would there have to be any adjustments?  Patient is not sure of the actual date of her surgery.

## 2022-01-31 NOTE — Telephone Encounter (Signed)
Patient was called and a message was left on her voicemail.

## 2022-03-09 ENCOUNTER — Other Ambulatory Visit: Payer: Self-pay | Admitting: Orthopedic Surgery

## 2022-03-09 DIAGNOSIS — M25462 Effusion, left knee: Secondary | ICD-10-CM

## 2022-04-03 ENCOUNTER — Encounter: Payer: Self-pay | Admitting: Nurse Practitioner

## 2022-04-03 ENCOUNTER — Ambulatory Visit: Payer: BC Managed Care – PPO | Admitting: Nurse Practitioner

## 2022-04-03 VITALS — BP 104/70 | HR 98 | Ht 62.0 in | Wt 207.6 lb

## 2022-04-03 DIAGNOSIS — E559 Vitamin D deficiency, unspecified: Secondary | ICD-10-CM

## 2022-04-03 DIAGNOSIS — I10 Essential (primary) hypertension: Secondary | ICD-10-CM

## 2022-04-03 DIAGNOSIS — E1165 Type 2 diabetes mellitus with hyperglycemia: Secondary | ICD-10-CM | POA: Diagnosis not present

## 2022-04-03 LAB — POCT GLYCOSYLATED HEMOGLOBIN (HGB A1C): Hemoglobin A1C: 8.2 % — AB (ref 4.0–5.6)

## 2022-04-03 MED ORDER — TRULICITY 4.5 MG/0.5ML ~~LOC~~ SOAJ: 4.5000 mg | SUBCUTANEOUS | 3 refills | Status: DC

## 2022-04-03 NOTE — Progress Notes (Signed)
04/03/2022, 11:54 AM  Endocrinology follow-up note   Subjective:    Patient ID: Sonya Kline, female    DOB: 10-Mar-1970.  Sonya Kline is being seen in follow-up after she was seen in consultation for management of currently uncontrolled symptomatic diabetes requested by  Asencion Noble, MD.   Past Medical History:  Diagnosis Date   Diabetes mellitus, type II ([place])    Diverticulosis    HTN (hypertension)     Past Surgical History:  Procedure Laterality Date   ABDOMINAL HYSTERECTOMY     BALLOON DILATION N/A 06/07/2021   Procedure: BALLOON DILATION;  Surgeon: Eloise Harman, DO;  [facility];  Service: Endoscopy;  Laterality: N/A;   CESAREAN SECTION     x 2   COLONOSCOPY N/A 08/22/2019   Procedure: COLONOSCOPY;  Surgeon: Daneil Dolin, MD;  Location: [facility];  Service: Endoscopy;  Laterality: N/A;  9:30   ESOPHAGOGASTRODUODENOSCOPY (EGD) WITH PROPOFOL N/A 06/07/2021   Procedure: ESOPHAGOGASTRODUODENOSCOPY (EGD) WITH PROPOFOL;  Surgeon: Eloise Harman, DO;  [facility];  Service: Endoscopy;  Laterality: N/A;  3:00pm   EXTRACORPOREAL SHOCK WAVE LITHOTRIPSY Right 02/15/2021   Procedure: EXTRACORPOREAL SHOCK WAVE LITHOTRIPSY (ESWL);  Surgeon: Primus Bravo., MD;  Location: [facility];  Service: Urology;  Laterality: Right;   POLYPECTOMY  08/22/2019   Procedure: POLYPECTOMY;  Surgeon: Daneil Dolin, MD;  Location: [facility];  Service: Endoscopy;;    Social History   Socioeconomic History   Marital status: Married    Spouse name: Not on file   Number of children: Not on file   Years of education: college   Highest education level: Not on file  Occupational History   Not on file  Tobacco Use   Smoking status: Never   Smokeless tobacco: Never  Vaping Use   Vaping Use: Never used  Substance and Sexual Activity   Alcohol use: No   Drug use: No   Sexual  activity: Yes    Birth control/protection: None, Surgical  Other Topics Concern   Not on file  Social History Narrative   Not on file   Social Determinants of Health   Financial Resource Strain: Not on file  Food Insecurity: Not on file  Transportation Needs: Not on file  Physical Activity: Not on file  Stress: Not on file  Social Connections: Not on file    Family History  Problem Relation Age of Onset   Diabetes Mother    Hypertension Mother    Hypertension Father     Outpatient Encounter Medications as of 04/03/2022  Medication Sig   amLODipine (NORVASC) 10 MG tablet Take 10 mg by mouth daily.    BD PEN NEEDLE NANO 2ND GEN 32G X 4 MM MISC SMARTSIG:Pre-Filled Pen Syringe Injection Daily   meloxicam (MOBIC) 7.5 MG tablet TAKE 1 TABLET(7.5 MG) BY MOUTH DAILY   rosuvastatin (CRESTOR) 5 MG tablet TAKE 1 TABLET(5 MG) BY MOUTH DAILY   [DISCONTINUED] Dulaglutide (TRULICITY) 4.5 0000000 SOPN Inject 4.5 mg as directed once a week.   Dulaglutide (TRULICITY) 4.5 0000000 SOPN Inject 4.5 mg as directed once a  week.   No facility-administered encounter medications on file as of 04/03/2022.    ALLERGIES: Allergies  Allergen Reactions   Contrast Media [Iodinated Contrast Media] Hives   Prednisone Rash    VACCINATION STATUS: Immunization History  Administered Date(s) Administered   Rabies, IM 03/17/2013, 03/20/2013, 03/27/2013, 04/11/2013   Tdap 03/17/2013    Diabetes She presents for her follow-up diabetic visit. She has type 2 diabetes mellitus. Onset time: She was diagnosed at approximate age of 21 years. Her disease course has been improving. There are no hypoglycemic associated symptoms. Pertinent negatives for hypoglycemia include no confusion, headaches, pallor or seizures. Pertinent negatives for diabetes include no chest pain, no fatigue, no polydipsia, no polyphagia, no polyuria and no weight loss. There are no hypoglycemic complications. Symptoms are stable. There are  no diabetic complications. Risk factors for coronary artery disease include diabetes mellitus, hypertension, obesity and dyslipidemia. Current diabetic treatments: Trulicity. She is compliant with treatment most of the time. Her weight is fluctuating minimally. She is following a generally healthy diet. When asked about meal planning, she reported none. She has not had a previous visit with a dietitian. She rarely participates in exercise. Her home blood glucose trend is fluctuating minimally. Her breakfast blood glucose range is generally 130-140 mg/dl. Her bedtime blood glucose range is generally 130-140 mg/dl. Her overall blood glucose range is 130-140 mg/dl. (She presents today with her logs, no meter, showing at goal glycemic profile overall.  Her POCT A1c today is 6.7%, improving from last visit of 6.9%.  She recently underwent tonsillectomy and has healed well.  She denies any hypoglycemia.) An ACE inhibitor/angiotensin II receptor blocker is not being taken. She does not see a podiatrist.Eye exam is current.  Hypertension This is a chronic problem. The current episode started more than 1 year ago. The problem has been resolved since onset. The problem is controlled. Pertinent negatives include no chest pain, headaches, palpitations or shortness of breath. There are no associated agents to hypertension. Risk factors for coronary artery disease include diabetes mellitus, family history, obesity, sedentary lifestyle and dyslipidemia. Past treatments include calcium channel blockers. The current treatment provides mild improvement. Compliance problems include diet and exercise.   Hyperlipidemia This is a chronic problem. The current episode started more than 1 year ago. The problem is uncontrolled. Recent lipid tests were reviewed and are high. Exacerbating diseases include diabetes. There are no known factors aggravating her hyperlipidemia. Pertinent negatives include no chest pain or shortness of breath.  Current antihyperlipidemic treatment includes statins. The current treatment provides mild improvement of lipids. Compliance problems include adherence to diet and adherence to exercise.  Risk factors for coronary artery disease include diabetes mellitus, dyslipidemia, hypertension, obesity and a sedentary lifestyle.    Review of systems  Constitutional: + slowly decreasing body weight,  current Body mass index is 37.97 kg/m. , no fatigue, no subjective hyperthermia, no subjective hypothermia Eyes: no blurry vision, no xerophthalmia ENT: no sore throat, no nodules palpated in throat, no dysphagia/odynophagia, no hoarseness Cardiovascular: no chest pain, no shortness of breath, no palpitations, no leg swelling Respiratory: no cough, no shortness of breath Gastrointestinal: no nausea/vomiting/diarrhea Musculoskeletal: no muscle/joint aches Skin: no rashes, no hyperemia Neurological: no tremors, no numbness, no tingling, no dizziness Psychiatric: no depression, no anxiety   Objective:    BP 104/70 (BP Location: Left Arm, Patient Position: Sitting, Cuff Size: Normal)   Pulse 98   Ht 5' 2"$  (1.575 m)   Wt 207 lb 9.6 oz (94.2 kg)  LMP 11/14/2011   BMI 37.97 kg/m    Wt Readings from Last 3 Encounters:  04/03/22 207 lb 9.6 oz (94.2 kg)  11/29/21 212 lb 9.6 oz (96.4 kg)  08/25/21 214 lb (97.1 kg)    BP Readings from Last 3 Encounters:  04/03/22 104/70  11/29/21 110/72  10/10/21 108/67     Physical Exam- Limited  Constitutional:  Body mass index is 37.97 kg/m. , not in acute distress, normal state of mind Eyes:  EOMI, no exophthalmos Musculoskeletal: no gross deformities, strength intact in all four extremities, no gross restriction of joint movements Skin:  no rashes, no hyperemia Neurological: no tremor with outstretched hands   Diabetic Foot Exam - Simple   No data filed     CMP ( most recent) CMP     Component Value Date/Time   NA 142 11/24/2021 0744   K 4.1  11/24/2021 0744   CL 101 11/24/2021 0744   CO2 26 11/24/2021 0744   GLUCOSE 98 11/24/2021 0744   GLUCOSE 239 (H) 02/01/2021 2320   BUN 14 11/24/2021 0744   CREATININE 0.79 11/24/2021 0744   CALCIUM 9.1 11/24/2021 0744   PROT 7.2 11/24/2021 0744   ALBUMIN 4.0 11/24/2021 0744   AST 21 11/24/2021 0744   ALT 16 11/24/2021 0744   ALKPHOS 87 11/24/2021 0744   BILITOT <0.2 11/24/2021 0744   GFRNONAA >60 02/01/2021 2320   GFRAA 102 01/21/2020 0737     Diabetic Labs (most recent): Lab Results  Component Value Date   HGBA1C 8.2 (A) 04/03/2022   HGBA1C 6.9 (A) 11/29/2021   HGBA1C 8.4 08/25/2021   MICROALBUR 30 01/29/2020        Assessment & Plan:   1) Type 2 diabetes mellitus with hyperglycemia ([place])  - BIRGITTA GEHLE has currently uncontrolled symptomatic type 2 DM since 52 years of age.  Recent labs reviewed.   She presents today with her logs, no meter, showing at goal glycemic profile overall.  Her POCT A1c today is 6.7%, improving from last visit of 6.9%.  She recently underwent tonsillectomy and has healed well.  She denies any hypoglycemia.  - I had a long discussion with her about the progressive nature of diabetes and the pathology behind its complications. -She does not report gross complications of diabetes, however she remains at a high risk for more acute and chronic complications which include CAD, CVA, CKD, retinopathy, and neuropathy. These are all discussed in detail with her.  - Nutritional counseling repeated at each appointment due to patients tendency to fall back in to old habits.  - The patient admits there is a room for improvement in their diet and drink choices. -  Suggestion is made for the patient to avoid simple carbohydrates from their diet including Cakes, Sweet Desserts / Pastries, Ice Cream, Soda (diet and regular), Sweet Tea, Candies, Chips, Cookies, Sweet Pastries, Store Bought Juices, Alcohol in Excess of 1-2 drinks a day, Artificial Sweeteners,  Coffee Creamer, and "Sugar-free" Products. This will help patient to have stable blood glucose profile and potentially avoid unintended weight gain.   - I encouraged the patient to switch to unprocessed or minimally processed complex starch and increased protein intake (animal or plant source), fruits, and vegetables.   - Patient is advised to stick to a routine mealtimes to eat 3 meals a day and avoid unnecessary snacks (to snack only to correct hypoglycemia).  - she is following with Jearld Fenton, RDN, CDE for diabetes education.  - I  have approached her with the following individualized plan to manage  her diabetes and patient agrees:   -She is advised to continue Trulicity 4.5 mg SQ weekly.    - she can take a break from routine glucose monitoring for now.  - Specific targets for  A1c;  LDL, HDL,  and Triglycerides were discussed with the patient.  2) Blood Pressure /Hypertension:  Her blood pressure is controlled to target.  She is advised to continue Norvasc 10 mg po daily.    3) Lipids/Hyperlipidemia:  Her most recent lipid panel from 11/24/21 shows uncontrolled LDL at 107 (stable).   She is advised to continue Crestor 5 mg po daily before bed.  Side effects and precautions discussed with her.    4)  Weight/Diet:  Her Body mass index is 37.97 kg/m.  -   clearly complicating her diabetes care.   she is a candidate for weight loss. I discussed with her the fact that loss of 5 - 10% of her  current body weight will have the most impact on her diabetes management.  Exercise, and detailed carbohydrates information provided  -  detailed on discharge instructions.  5) Vitamin D Deficiency: Her recent vitamin D level from 11/24/21 was low at 26.7.  She is not currently on any supplementation.  She is advised to start OTC Vitamin D3 5000 units daily.  6) Chronic Care/Health Maintenance: -she is not on ACEI/ARB is on Statin medications and is encouraged to initiate and continue to follow  up with Ophthalmology, Dentist,  Podiatrist at least yearly or according to recommendations, and advised to stay away from smoking. I have recommended yearly flu vaccine and pneumonia vaccine at least every 5 years; moderate intensity exercise for up to 150 minutes weekly; and  sleep for at least 7 hours a day.  - she is advised to maintain close follow up with Asencion Noble, MD for primary care needs, as well as her other providers for optimal and coordinated care.     I spent  35  minutes in the care of the patient today including review of labs from [place], Lipids, Thyroid Function, Hematology (current and previous including abstractions from other facilities); face-to-face time discussing  her blood glucose readings/logs, discussing hypoglycemia and hyperglycemia episodes and symptoms, medications doses, her options of short and long term treatment based on the latest standards of care / guidelines;  discussion about incorporating lifestyle medicine;  and documenting the encounter. Risk reduction counseling performed per USPSTF guidelines to reduce obesity and cardiovascular risk factors.     Please refer to Patient Instructions for Blood Glucose Monitoring and Insulin/Medications Dosing Guide"  in media tab for additional information. Please  also refer to " Patient Self Inventory" in the Media  tab for reviewed elements of pertinent patient history.  [name] participated in the discussions, expressed understanding, and voiced agreement with the above plans.  All questions were answered to her satisfaction. she is encouraged to contact clinic should she have any questions or concerns prior to her return visit.    Follow up plan: - Return in about 6 months (around 10/02/2022) for Diabetes F/U with A1c in office, No previsit labs.  Rayetta Pigg, [facility] [address] [place], [place] [address] Phone: [phone] Fax: [phone]  04/03/2022, 11:54  AM

## 2022-04-12 DIAGNOSIS — L83 Acanthosis nigricans: Secondary | ICD-10-CM | POA: Diagnosis not present

## 2022-04-12 DIAGNOSIS — L918 Other hypertrophic disorders of the skin: Secondary | ICD-10-CM | POA: Diagnosis not present

## 2022-04-12 DIAGNOSIS — L700 Acne vulgaris: Secondary | ICD-10-CM | POA: Diagnosis not present

## 2022-04-13 ENCOUNTER — Encounter: Payer: Self-pay | Admitting: Radiology

## 2022-04-29 ENCOUNTER — Other Ambulatory Visit: Payer: Self-pay | Admitting: Nurse Practitioner

## 2022-06-19 ENCOUNTER — Telehealth: Payer: Self-pay | Admitting: *Deleted

## 2022-06-19 ENCOUNTER — Other Ambulatory Visit: Payer: Self-pay | Admitting: *Deleted

## 2022-06-19 DIAGNOSIS — E1165 Type 2 diabetes mellitus with hyperglycemia: Secondary | ICD-10-CM

## 2022-06-19 MED ORDER — TRULICITY 0.75 MG/0.5ML ~~LOC~~ SOAJ: 0.7500 mg | SUBCUTANEOUS | 0 refills | Status: DC

## 2022-06-19 MED ORDER — TRULICITY 1.5 MG/0.5ML ~~LOC~~ SOAJ
1.5000 mg | SUBCUTANEOUS | 0 refills | Status: DC
Start: 2022-06-19 — End: 2022-10-02

## 2022-06-19 NOTE — Telephone Encounter (Signed)
Try Trulicity 1.5 mg SQ weekly -6 ml with 1 refill.  This one was approved by insurance previously so hopefully she wont run into any PA problems.

## 2022-06-19 NOTE — Telephone Encounter (Signed)
Patient called and said that this needed [place] to [facility] and not [facility], this has been sent in for thrmpatient.

## 2022-06-19 NOTE — Telephone Encounter (Signed)
Patient called the office to say that she was due her Trulicity shot today. Her pharmacy and other pharmacy do no have this. All due to the national shortage.  Patient was advised to call her pharmacy and see if they had the lower dose of Trulicity, if so a prescription could be sent in for this, per [place]. Patient will let us know.

## 2022-06-19 NOTE — Telephone Encounter (Signed)
[facility] does not have the Trulicity 1.5 dose but they do have the Trulcity .75 dose. Per Alphonzo Lemmings we may send in a script for this.Patient made aware.

## 2022-06-19 NOTE — Telephone Encounter (Signed)
Patient left a message that she had called the [facility] on [address] in [place], they told the patient that they had the low doses of the Trulicity and the [place] available. She would like for tis to be sent to her pharmacy , then we call and let her know which one we went with.[name]

## 2022-06-30 ENCOUNTER — Other Ambulatory Visit (HOSPITAL_COMMUNITY): Payer: Self-pay | Admitting: Internal Medicine

## 2022-06-30 DIAGNOSIS — Z1231 Encounter for screening mammogram for malignant neoplasm of breast: Secondary | ICD-10-CM

## 2022-07-07 ENCOUNTER — Ambulatory Visit (HOSPITAL_COMMUNITY): Payer: BC Managed Care – PPO

## 2022-08-02 ENCOUNTER — Encounter (HOSPITAL_COMMUNITY): Payer: Self-pay

## 2022-08-02 ENCOUNTER — Ambulatory Visit (HOSPITAL_COMMUNITY)
Admission: RE | Admit: 2022-08-02 | Discharge: 2022-08-02 | Disposition: A | Discharge status: Home / Self Care | Payer: BC Managed Care – PPO | Source: Ambulatory Visit | Attending: Internal Medicine | Admitting: Internal Medicine

## 2022-08-02 DIAGNOSIS — Z1231 Encounter for screening mammogram for malignant neoplasm of breast: Secondary | ICD-10-CM | POA: Diagnosis not present

## 2022-08-21 ENCOUNTER — Other Ambulatory Visit (HOSPITAL_COMMUNITY): Payer: Self-pay

## 2022-08-30 ENCOUNTER — Other Ambulatory Visit (HOSPITAL_COMMUNITY): Payer: Self-pay

## 2022-09-24 ENCOUNTER — Other Ambulatory Visit: Payer: Self-pay

## 2022-09-24 ENCOUNTER — Emergency Department (HOSPITAL_COMMUNITY): Payer: BC Managed Care – PPO

## 2022-09-24 ENCOUNTER — Emergency Department (HOSPITAL_COMMUNITY)
Admission: EM | Admit: 2022-09-24 | Discharge: 2022-09-24 | Disposition: A | Discharge status: Home / Self Care | Payer: BC Managed Care – PPO | Attending: Emergency Medicine | Admitting: Emergency Medicine

## 2022-09-24 ENCOUNTER — Encounter (HOSPITAL_COMMUNITY): Payer: Self-pay

## 2022-09-24 DIAGNOSIS — R319 Hematuria, unspecified: Secondary | ICD-10-CM | POA: Insufficient documentation

## 2022-09-24 DIAGNOSIS — N201 Calculus of ureter: Secondary | ICD-10-CM

## 2022-09-24 DIAGNOSIS — N132 Hydronephrosis with renal and ureteral calculous obstruction: Secondary | ICD-10-CM | POA: Insufficient documentation

## 2022-09-24 DIAGNOSIS — N2882 Megaloureter: Secondary | ICD-10-CM | POA: Diagnosis not present

## 2022-09-24 DIAGNOSIS — D72829 Elevated white blood cell count, unspecified: Secondary | ICD-10-CM | POA: Diagnosis not present

## 2022-09-24 DIAGNOSIS — R109 Unspecified abdominal pain: Secondary | ICD-10-CM | POA: Diagnosis not present

## 2022-09-24 DIAGNOSIS — E876 Hypokalemia: Secondary | ICD-10-CM | POA: Diagnosis not present

## 2022-09-24 DIAGNOSIS — K5730 Diverticulosis of large intestine without perforation or abscess without bleeding: Secondary | ICD-10-CM | POA: Diagnosis not present

## 2022-09-24 HISTORY — DX: Calculus of kidney: N20.0

## 2022-09-24 LAB — CBC WITH DIFFERENTIAL/PLATELET
Abs Immature Granulocytes: 0.05 10*3/uL (ref 0.00–0.07)
Basophils Absolute: 0 10*3/uL (ref 0.0–0.1)
Basophils Relative: 0 %
Eosinophils Absolute: 0.1 10*3/uL (ref 0.0–0.5)
Eosinophils Relative: 1 %
HCT: 43.8 % (ref 36.0–46.0)
Hemoglobin: 13.8 g/dL (ref 12.0–15.0)
Immature Granulocytes: 0 %
Lymphocytes Relative: 26 %
Lymphs Abs: 3 10*3/uL (ref 0.7–4.0)
MCH: 25.5 pg — ABNORMAL LOW (ref 26.0–34.0)
MCHC: 31.5 g/dL (ref 30.0–36.0)
MCV: 80.8 fL (ref 80.0–100.0)
Monocytes Absolute: 0.8 10*3/uL (ref 0.1–1.0)
Monocytes Relative: 7 %
Neutro Abs: 7.7 10*3/uL (ref 1.7–7.7)
Neutrophils Relative %: 66 %
Platelets: 361 10*3/uL (ref 150–400)
RBC: 5.42 MIL/uL — ABNORMAL HIGH (ref 3.87–5.11)
RDW: 15.8 % — ABNORMAL HIGH (ref 11.5–15.5)
WBC: 11.6 10*3/uL — ABNORMAL HIGH (ref 4.0–10.5)
nRBC: 0 % (ref 0.0–0.2)

## 2022-09-24 LAB — URINALYSIS, ROUTINE W REFLEX MICROSCOPIC
Bacteria, UA: NONE SEEN
Bilirubin Urine: NEGATIVE
Glucose, UA: NEGATIVE mg/dL
Ketones, ur: NEGATIVE mg/dL
Leukocytes,Ua: NEGATIVE
Nitrite: NEGATIVE
Protein, ur: NEGATIVE mg/dL
RBC / HPF: >50 RBC/hpf (ref 0–5)
Specific Gravity, Urine: 1.011 (ref 1.005–1.030)
pH: 7 (ref 5.0–8.0)

## 2022-09-24 LAB — BASIC METABOLIC PANEL
Anion gap: 8 (ref 5–15)
BUN: 12 mg/dL (ref 6–20)
CO2: 29 mmol/L (ref 22–32)
Calcium: 8.9 mg/dL (ref 8.9–10.3)
Chloride: 102 mmol/L (ref 98–111)
Creatinine, Ser: 0.71 mg/dL (ref 0.44–1.00)
GFR, Estimated: >60 mL/min (ref >60)
Glucose, Bld: 109 mg/dL — ABNORMAL HIGH (ref 70–99)
Potassium: 3.2 mmol/L — ABNORMAL LOW (ref 3.5–5.1)
Sodium: 139 mmol/L (ref 135–145)

## 2022-09-24 MED ORDER — KETOROLAC TROMETHAMINE 30 MG/ML IJ SOLN
30.0000 mg | Freq: Once | INTRAMUSCULAR | Status: AC
  Administered 2022-09-24: 30 mg via INTRAVENOUS
  Filled 2022-09-24: qty 1

## 2022-09-24 MED ORDER — OXYCODONE-ACETAMINOPHEN 5-325 MG PO TABS: 1.0000 | ORAL_TABLET | Freq: Four times a day (QID) | ORAL | 0 refills | Status: DC | PRN

## 2022-09-24 MED ORDER — TAMSULOSIN HCL 0.4 MG PO CAPS: 0.4000 mg | ORAL_CAPSULE | Freq: Every day | ORAL | 0 refills | Status: DC

## 2022-09-24 NOTE — ED Provider Notes (Signed)
[place] EMERGENCY DEPARTMENT AT [facility] Provider Note   CSN: [id number] Arrival date & time: 09/24/22  4098     History  Chief Complaint  Patient presents with   Flank Pain    Sonya Kline is a 52 y.o. female.  She has a history of kidney stones and has had lithotripsy before.  She said she woke up this morning with right flank pain similar to prior kidney stones.  She rates it as 6 out of 10.  Not associated with any fevers chills nausea vomiting or urinary symptoms.  The history is provided by the patient.  Flank Pain This is a recurrent problem. The current episode started 3 to 5 hours ago. The problem occurs constantly. The problem has not changed since onset.Pertinent negatives include no chest pain, no abdominal pain, no headaches and no shortness of breath. Nothing aggravates the symptoms. Nothing relieves the symptoms. She has tried nothing for the symptoms. The treatment provided no relief.       Home Medications Prior to Admission medications   Medication Sig Start Date End Date Taking? Authorizing Provider  amLODipine (NORVASC) 10 MG tablet Take 10 mg by mouth daily.     [provider]  BD PEN NEEDLE NANO 2ND GEN 32G X 4 MM MISC SMARTSIG:Pre-Filled Pen Syringe Injection Daily 10/28/19   [provider]  Dulaglutide (TRULICITY) 0.75 MG/0.5ML SOPN Inject 0.75 mg into the skin once a week. 06/19/22   Dani Gobble, NP  Dulaglutide (TRULICITY) 1.5 MG/0.5ML SOPN Inject 1.5 mg into the skin once a week. 06/19/22   Dani Gobble, NP  Dulaglutide (TRULICITY) 4.5 MG/0.5ML SOPN Inject 4.5 mg as directed once a week. 04/03/22   Dani Gobble, NP  meloxicam (MOBIC) 7.5 MG tablet TAKE 1 TABLET(7.5 MG) BY MOUTH DAILY 03/09/22   Vickki Hearing, MD  rosuvastatin (CRESTOR) 5 MG tablet TAKE 1 TABLET(5 MG) BY MOUTH DAILY 05/01/22   Dani Gobble, NP      Allergies    Contrast media [iodinated contrast media] and Prednisone    Review  of Systems   Review of Systems  Constitutional:  Negative for fever.  Respiratory:  Negative for shortness of breath.   Cardiovascular:  Negative for chest pain.  Gastrointestinal:  Negative for abdominal pain.  Genitourinary:  Positive for flank pain. Negative for dysuria.  Musculoskeletal:  Positive for back pain.  Neurological:  Negative for headaches.    Physical Exam Updated Vital Signs BP (!) 154/90   Pulse 87   Temp 98.3 F (36.8 C) (Oral)   Resp 17   Ht 5\' 2"  (1.575 m)   Wt 92.5 kg   LMP 11/14/2011   SpO2 97%   BMI 37.31 kg/m  Physical Exam Vitals and nursing note reviewed.  Constitutional:      General: She is not in acute distress.    Appearance: Normal appearance. She is well-developed.  HENT:     Head: Normocephalic and atraumatic.  Eyes:     Conjunctiva/sclera: Conjunctivae normal.  Cardiovascular:     Rate and Rhythm: Normal rate and regular rhythm.     Heart sounds: No murmur heard. Pulmonary:     Effort: Pulmonary effort is normal. No respiratory distress.     Breath sounds: Normal breath sounds.  Abdominal:     Palpations: Abdomen is soft.     Tenderness: There is no abdominal tenderness. There is no guarding or rebound.  Musculoskeletal:  General: No deformity.     Cervical back: Neck supple.  Skin:    General: Skin is warm and dry.     Capillary Refill: Capillary refill takes less than 2 seconds.  Neurological:     General: No focal deficit present.     Mental Status: She is alert.     Gait: Gait normal.     ED Results / Procedures / Treatments   Labs (all labs ordered are listed, but only abnormal results are displayed) Labs Reviewed  URINALYSIS, ROUTINE W REFLEX MICROSCOPIC - Abnormal; Notable for the following components:      Result Value   APPearance HAZY (*)    Hgb urine dipstick LARGE (*)    All other components within normal limits  BASIC METABOLIC PANEL - Abnormal; Notable for the following components:   Potassium 3.2  (*)    Glucose, Bld 109 (*)    All other components within normal limits  CBC WITH DIFFERENTIAL/PLATELET - Abnormal; Notable for the following components:   WBC 11.6 (*)    RBC 5.42 (*)    MCH 25.5 (*)    RDW 15.8 (*)    All other components within normal limits    EKG None  Radiology CT Renal Stone Study  Result Date: 09/24/2022 CLINICAL DATA:  Right-sided abdominal and flank pain. Stone suspected. Personal history of nephrolithiasis. The patient states this pain feels like pain with previous obstructions. EXAM: CT ABDOMEN AND PELVIS WITHOUT CONTRAST TECHNIQUE: Multidetector CT imaging of the abdomen and pelvis was performed following the standard protocol without IV contrast. RADIATION DOSE REDUCTION: This exam was performed according to the departmental dose-optimization program which includes automated exposure control, adjustment of the mA and/or kV according to patient size and/or use of iterative reconstruction technique. COMPARISON:  One-view abdomen 10/10/2021. CT renal stone protocol 02/01/2021 FINDINGS: Lower chest: The lung bases are clear without focal nodule, mass, or airspace disease. The heart size is normal. No significant pleural or pericardial effusion is present. Hepatobiliary: No focal liver abnormality is seen. No gallstones, gallbladder wall thickening, or biliary dilatation. Pancreas: Unremarkable. No pancreatic ductal dilatation or surrounding inflammatory changes. Spleen: Normal in size without focal abnormality. Adrenals/Urinary Tract: Adrenal glands are normal bilaterally. Moderate right-sided hydronephrosis is present. The right ureter is dilated with some stranding to the level of an obstructing 3 mm stone just above the UVJ. A 7 mm nonobstructing stone is present at the lower pole of the left kidney as seen on previous plain film radiograph. No other nephrolithiasis is present. No other masses scratched at no mass lesion is present. Left ureter is within normal limits.  The urinary bladder is normal. Stomach/Bowel: The stomach and duodenum are within normal limits. Small bowel is unremarkable. The terminal ileum is normal. Appendix is visualized and normal. Diverticular changes are present in the ascending colon without inflammation. The transverse colon is within normal limits. Diverticular changes are present in the distal descending and sigmoid colon without inflammatory change. Vascular/Lymphatic: Scattered atherosclerotic changes are present in the abdominal aorta and branch vessels. No aneurysm is present. Reproductive: A 4.7 cm scratched at a 4.8 cm cyst is again noted in the left adnexa. A peripheral calcification is noted along the cyst. Patient is status post hysterectomy. The right adnexa is unremarkable. Other: No abdominal wall hernia or abnormality. No abdominopelvic ascites. Musculoskeletal: No acute or significant osseous findings. IMPRESSION: 1. Obstructing 3 mm stone in the distal right ureter with moderate right-sided hydronephrosis. 2. 7 mm nonobstructing stone  at the lower pole of the left kidney. 3. Colonic diverticulosis without diverticulitis. 4. Stable 4.8 cm cyst in the left adnexa. Given stability over 2.5 years, no follow-up imaging is recommended. 5.  Aortic Atherosclerosis (ICD10-I70.0). Electronically Signed   By: Marin Roberts M.D.   On: 09/24/2022 09:33    Procedures Procedures    Medications Ordered in ED Medications  ketorolac (TORADOL) 30 MG/ML injection 30 mg (has no administration in time range)    ED Course/ Medical Decision Making/ A&P                                 Medical Decision Making Amount and/or Complexity of Data Reviewed Labs: ordered. Radiology: ordered.  Risk Prescription drug management.   This patient complains of right flank pain; this involves an extensive number of treatment Options and is a complaint that carries with it a high risk of complications and morbidity. The differential includes  renal colic, pyelonephritis, cholelithiasis, cholecystitis, musculoskeletal  I ordered, reviewed and interpreted labs, which included CBC with mildly elevated white count stable hemoglobin, chemistries with mildly low potassium, urinalysis with hematuria no gross signs of infection I ordered medication IV Toradol and reviewed PMP when indicated. I ordered imaging studies which included CT renal and I independently    visualized and interpreted imaging which showed 3 mm distal ureteral stone on the right. Previous records obtained and reviewed in epic, has seen urology in the past Cardiac monitoring reviewed, normal sinus rhythm Social determinants considered, no significant barriers Critical Interventions: None  After the interventions stated above, I reevaluated the patient and found patient's pain to be controlled Admission and further testing considered, no indications for admission or further workup at this time.  Will treat her with pain medication and Flomax, given contact information for urology.  Return instructions discussed         Final Clinical Impression(s) / ED Diagnoses Final diagnoses:  Ureteral calculus, right    Rx / DC Orders ED Discharge Orders          Ordered    tamsulosin (FLOMAX) 0.4 MG CAPS capsule  Daily        09/24/22 0946    oxyCODONE-acetaminophen (PERCOCET/ROXICET) 5-325 MG tablet  Every 6 hours PRN        09/24/22 0946              Terrilee Files, MD 09/24/22 1754

## 2022-09-24 NOTE — Discharge Instructions (Addendum)
You are seen in the emergency department for right flank pain.  You have a 3 mm kidney stone in the tube going from your kidney to your bladder.  This is likely the cause of your symptoms.  Please drink plenty of fluids and we are putting you on some pain medication and some medication to help pass the stone.  Contact [facility] for follow-up.  Return to the emergency department if any high fevers or worsening symptoms

## 2022-09-24 NOTE — ED Triage Notes (Signed)
Pt c/o right flank pain in her back since this morning.Pt has a hx of kidney stones and she states it feels just like this. No pain or frequency during urination.

## 2022-10-02 ENCOUNTER — Encounter: Payer: Self-pay | Admitting: Nurse Practitioner

## 2022-10-02 ENCOUNTER — Ambulatory Visit: Payer: BC Managed Care – PPO | Admitting: Nurse Practitioner

## 2022-10-02 VITALS — BP 103/69 | HR 94 | Ht 62.0 in | Wt 203.4 lb

## 2022-10-02 DIAGNOSIS — E1165 Type 2 diabetes mellitus with hyperglycemia: Secondary | ICD-10-CM

## 2022-10-02 DIAGNOSIS — E559 Vitamin D deficiency, unspecified: Secondary | ICD-10-CM

## 2022-10-02 DIAGNOSIS — Z7985 Long-term (current) use of injectable non-insulin antidiabetic drugs: Secondary | ICD-10-CM | POA: Diagnosis not present

## 2022-10-02 DIAGNOSIS — I10 Essential (primary) hypertension: Secondary | ICD-10-CM | POA: Diagnosis not present

## 2022-10-02 LAB — POCT GLYCOSYLATED HEMOGLOBIN (HGB A1C): Hemoglobin A1C: 6.1 % — AB (ref 4.0–5.6)

## 2022-10-02 MED ORDER — TRULICITY 4.5 MG/0.5ML ~~LOC~~ SOAJ: 4.5000 mg | SUBCUTANEOUS | 3 refills | Status: DC

## 2022-10-02 NOTE — Progress Notes (Signed)
10/02/2022, 9:33 AM  Endocrinology follow-up note   Subjective:    Patient ID: Sonya Kline, female    DOB: 10/28/1970.  Sonya Kline is being seen in follow-up after she was seen in consultation for management of currently uncontrolled symptomatic diabetes requested by  Carylon Perches, MD.   Past Medical History:  Diagnosis Date   Diabetes mellitus, type II (HCC)    Diverticulosis    HTN (hypertension)    Kidney stone     Past Surgical History:  Procedure Laterality Date   ABDOMINAL HYSTERECTOMY     BALLOON DILATION N/A 06/07/2021   Procedure: BALLOON DILATION;  Surgeon: Lanelle Bal, DO;  [facility];  Service: Endoscopy;  Laterality: N/A;   CESAREAN SECTION     x 2   COLONOSCOPY N/A 08/22/2019   Procedure: COLONOSCOPY;  Surgeon: Corbin Ade, MD;  Location: [facility];  Service: Endoscopy;  Laterality: N/A;  9:30   ESOPHAGOGASTRODUODENOSCOPY (EGD) WITH PROPOFOL N/A 06/07/2021   Procedure: ESOPHAGOGASTRODUODENOSCOPY (EGD) WITH PROPOFOL;  Surgeon: Lanelle Bal, DO;  Location: [facility];  Service: Endoscopy;  Laterality: N/A;  3:00pm   EXTRACORPOREAL SHOCK WAVE LITHOTRIPSY Right 02/15/2021   Procedure: EXTRACORPOREAL SHOCK WAVE LITHOTRIPSY (ESWL);  Surgeon: Milderd Meager., MD;  Location: [facility];  Service: Urology;  Laterality: Right;   POLYPECTOMY  08/22/2019   Procedure: POLYPECTOMY;  Surgeon: Corbin Ade, MD;  Location: [facility];  Service: Endoscopy;;    Social History   Socioeconomic History   Marital status: Married    Spouse name: Not on file   Number of children: Not on file   Years of education: college   Highest education level: Not on file  Occupational History   Not on file  Tobacco Use   Smoking status: Never   Smokeless tobacco: Never  Vaping Use   Vaping status: Never Used  Substance and Sexual Activity   Alcohol use: No   Drug use:  No   Sexual activity: Yes    Birth control/protection: None, Surgical  Other Topics Concern   Not on file  Social History Narrative   Not on file   Social Determinants of Health   Financial Resource Strain: Not on file  Food Insecurity: Not on file  Transportation Needs: Not on file  Physical Activity: Sufficiently Active (02/25/2018)   Received [facility], [facility]   Exercise Vital Sign    Days of Exercise per Week: 3 days    Minutes of Exercise per Session: 60 min  Stress: Not on file  Social Connections: Not on file    Family History  Problem Relation Age of Onset   Diabetes Mother    Hypertension Mother    Hypertension Father     Outpatient Encounter Medications as of 10/02/2022  Medication Sig   amLODipine (NORVASC) 10 MG tablet Take 10 mg by mouth daily.    [DISCONTINUED] Dulaglutide (TRULICITY) 4.5 MG/0.5ML SOPN Inject 4.5 mg as directed once a week.   BD PEN NEEDLE NANO 2ND GEN 32G X 4 MM MISC SMARTSIG:Pre-Filled Pen Syringe Injection Daily (Patient not  taking: Reported on 10/02/2022)   Dulaglutide (TRULICITY) 4.5 MG/0.5ML SOPN Inject 4.5 mg as directed once a week.   rosuvastatin (CRESTOR) 5 MG tablet TAKE 1 TABLET(5 MG) BY MOUTH DAILY (Patient not taking: Reported on 10/02/2022)   [DISCONTINUED] Dulaglutide (TRULICITY) 0.75 MG/0.5ML SOPN Inject 0.75 mg into the skin once a week. (Patient not taking: Reported on 10/02/2022)   [DISCONTINUED] Dulaglutide (TRULICITY) 1.5 MG/0.5ML SOPN Inject 1.5 mg into the skin once a week. (Patient not taking: Reported on 10/02/2022)   [DISCONTINUED] meloxicam (MOBIC) 7.5 MG tablet TAKE 1 TABLET(7.5 MG) BY MOUTH DAILY (Patient not taking: Reported on 10/02/2022)   [DISCONTINUED] oxyCODONE-acetaminophen (PERCOCET/ROXICET) 5-325 MG tablet Take 1 tablet by mouth every 6 (six) hours as needed for severe pain. (Patient not taking: Reported on 10/02/2022)   [DISCONTINUED] tamsulosin (FLOMAX) 0.4 MG CAPS capsule Take 1 capsule (0.4  mg total) by mouth daily. (Patient not taking: Reported on 10/02/2022)   No facility-administered encounter medications on file as of 10/02/2022.    ALLERGIES: Allergies  Allergen Reactions   Contrast Media [Iodinated Contrast Media] Hives   Prednisone Rash    VACCINATION STATUS: Immunization History  Administered Date(s) Administered   Rabies, IM 03/17/2013, 03/20/2013, 03/27/2013, 04/11/2013   Tdap 03/17/2013    Diabetes She presents for her follow-up diabetic visit. She has type 2 diabetes mellitus. Onset time: She was diagnosed at approximate age of 29 years. Her disease course has been improving. There are no hypoglycemic associated symptoms. Pertinent negatives for hypoglycemia include no confusion, headaches, pallor or seizures. Pertinent negatives for diabetes include no chest pain, no fatigue, no polydipsia, no polyphagia, no polyuria and no weight loss. There are no hypoglycemic complications. Symptoms are stable. There are no diabetic complications. Risk factors for coronary artery disease include diabetes mellitus, hypertension, obesity and dyslipidemia. Current diabetic treatments: Trulicity. She is compliant with treatment most of the time. Her weight is decreasing steadily. She is following a generally healthy diet. When asked about meal planning, she reported none. She has not had a previous visit with a dietitian. She rarely participates in exercise. (She presents today with no meter or logs to review (was not asked to routinely monitor glucose given safe medication regimen).  Her POCT A1c today is 6.1%, improving from last visit of 8%.  She has plans to start walking again now that the weather is cooler.) An ACE inhibitor/angiotensin II receptor blocker is not being taken. She does not see a podiatrist.Eye exam is current.  Hypertension This is a chronic problem. The current episode started more than 1 year ago. The problem has been resolved since onset. The problem is  controlled. Pertinent negatives include no chest pain, headaches, palpitations or shortness of breath. There are no associated agents to hypertension. Risk factors for coronary artery disease include diabetes mellitus, family history, obesity, sedentary lifestyle and dyslipidemia. Past treatments include calcium channel blockers. The current treatment provides mild improvement. Compliance problems include diet and exercise.   Hyperlipidemia This is a chronic problem. The current episode started more than 1 year ago. The problem is uncontrolled. Recent lipid tests were reviewed and are high. Exacerbating diseases include diabetes. There are no known factors aggravating her hyperlipidemia. Pertinent negatives include no chest pain or shortness of breath. Current antihyperlipidemic treatment includes statins. The current treatment provides mild improvement of lipids. Compliance problems include adherence to diet and adherence to exercise.  Risk factors for coronary artery disease include diabetes mellitus, dyslipidemia, hypertension, obesity and a sedentary lifestyle.  Review of systems  Constitutional: + slowly decreasing body weight,  current Body mass index is 37.2 kg/m. , no fatigue, no subjective hyperthermia, no subjective hypothermia Eyes: no blurry vision, no xerophthalmia ENT: no sore throat, no nodules palpated in throat, no dysphagia/odynophagia, no hoarseness Cardiovascular: no chest pain, no shortness of breath, no palpitations, no leg swelling Respiratory: no cough, no shortness of breath Gastrointestinal: no nausea/vomiting/diarrhea Musculoskeletal: no muscle/joint aches Skin: no rashes, no hyperemia Neurological: no tremors, no numbness, no tingling, no dizziness Psychiatric: no depression, no anxiety   Objective:    BP 103/69 (BP Location: Left Arm, Patient Position: Sitting, Cuff Size: Large)   Pulse 94   Ht 5\' 2"  (1.575 m)   Wt 203 lb 6.4 oz (92.3 kg)   LMP 11/14/2011    BMI 37.20 kg/m    Wt Readings from Last 3 Encounters:  10/02/22 203 lb 6.4 oz (92.3 kg)  09/24/22 204 lb (92.5 kg)  04/03/22 207 lb 9.6 oz (94.2 kg)    BP Readings from Last 3 Encounters:  10/02/22 103/69  09/24/22 (!) 131/92  04/03/22 104/70     Physical Exam- Limited  Constitutional:  Body mass index is 37.2 kg/m. , not in acute distress, normal state of mind Eyes:  EOMI, no exophthalmos Musculoskeletal: no gross deformities, strength intact in all four extremities, no gross restriction of joint movements Skin:  no rashes, no hyperemia Neurological: no tremor with outstretched hands   Diabetic Foot Exam - Simple   No data filed     CMP ( most recent) CMP     Component Value Date/Time   NA 139 09/24/2022 0831   NA 142 11/24/2021 0744   K 3.2 (L) 09/24/2022 0831   CL 102 09/24/2022 0831   CO2 29 09/24/2022 0831   GLUCOSE 109 (H) 09/24/2022 0831   BUN 12 09/24/2022 0831   BUN 14 11/24/2021 0744   CREATININE 0.71 09/24/2022 0831   CALCIUM 8.9 09/24/2022 0831   PROT 7.2 11/24/2021 0744   ALBUMIN 4.0 11/24/2021 0744   AST 21 11/24/2021 0744   ALT 16 11/24/2021 0744   ALKPHOS 87 11/24/2021 0744   BILITOT <0.2 11/24/2021 0744   GFRNONAA >60 09/24/2022 0831   GFRAA 102 01/21/2020 0737     Diabetic Labs (most recent): Lab Results  Component Value Date   HGBA1C 6.1 (A) 10/02/2022   HGBA1C 8.2 (A) 04/03/2022   HGBA1C 6.9 (A) 11/29/2021   MICROALBUR 30 01/29/2020        Assessment & Plan:   1) Type 2 diabetes mellitus with hyperglycemia (HCC)  - TAQUIA PIZZOLA has currently uncontrolled symptomatic type 2 DM since 52 years of age.  Recent labs reviewed.   She presents today with no meter or logs to review (was not asked to routinely monitor glucose given safe medication regimen).  Her POCT A1c today is 6.1%, improving from last visit of 8%.  She has plans to start walking again now that the weather is cooler.  - I had a long discussion with her about  the progressive nature of diabetes and the pathology behind its complications. -She does not report gross complications of diabetes, however she remains at a high risk for more acute and chronic complications which include CAD, CVA, CKD, retinopathy, and neuropathy. These are all discussed in detail with her.  - Nutritional counseling repeated at each appointment due to patients tendency to fall back in to old habits.  - The patient admits there is a  room for improvement in their diet and drink choices. -  Suggestion is made for the patient to avoid simple carbohydrates from their diet including Cakes, Sweet Desserts / Pastries, Ice Cream, Soda (diet and regular), Sweet Tea, Candies, Chips, Cookies, Sweet Pastries, Store Bought Juices, Alcohol in Excess of 1-2 drinks a day, Artificial Sweeteners, Coffee Creamer, and "Sugar-free" Products. This will help patient to have stable blood glucose profile and potentially avoid unintended weight gain.   - I encouraged the patient to switch to unprocessed or minimally processed complex starch and increased protein intake (animal or plant source), fruits, and vegetables.   - Patient is advised to stick to a routine mealtimes to eat 3 meals a day and avoid unnecessary snacks (to snack only to correct hypoglycemia).  - she is following with Norm Salt, RDN, CDE for diabetes education.  - I have approached her with the following individualized plan to manage  her diabetes and patient agrees:   -She is advised to continue Trulicity 4.5 mg SQ weekly.    - she can take a break from routine glucose monitoring for now.  - Specific targets for  A1c;  LDL, HDL,  and Triglycerides were discussed with the patient.  2) Blood Pressure /Hypertension:  Her blood pressure is controlled to target.  She is advised to continue Norvasc 10 mg po daily.    3) Lipids/Hyperlipidemia:  Her most recent lipid panel from 11/24/21 shows uncontrolled LDL at 105 (stable).   She  stopped taking her Crestor between visits.   Will recheck lipid panel prior to next visit and reinitiate if needed.   4)  Weight/Diet:  Her Body mass index is 37.2 kg/m.  -   clearly complicating her diabetes care.   she is a candidate for weight loss. I discussed with her the fact that loss of 5 - 10% of her  current body weight will have the most impact on her diabetes management.  Exercise, and detailed carbohydrates information provided  -  detailed on discharge instructions.  5) Vitamin D Deficiency: Her recent vitamin D level from 11/24/21 was low at 26.7.  She is not currently on any supplementation.  She is advised to restart OTC Vitamin D3 5000 units daily.  6) Chronic Care/Health Maintenance: -she is not on ACEI/ARB is on Statin medications and is encouraged to initiate and continue to follow up with Ophthalmology, Dentist,  Podiatrist at least yearly or according to recommendations, and advised to stay away from smoking. I have recommended yearly flu vaccine and pneumonia vaccine at least every 5 years; moderate intensity exercise for up to 150 minutes weekly; and  sleep for at least 7 hours a day.  - she is advised to maintain close follow up with Carylon Perches, MD for primary care needs, as well as her other providers for optimal and coordinated care.     I spent  27  minutes in the care of the patient today including review of labs from CMP, Lipids, Thyroid Function, Hematology (current and previous including abstractions from other facilities); face-to-face time discussing  her blood glucose readings/logs, discussing hypoglycemia and hyperglycemia episodes and symptoms, medications doses, her options of short and long term treatment based on the latest standards of care / guidelines;  discussion about incorporating lifestyle medicine;  and documenting the encounter. Risk reduction counseling performed per USPSTF guidelines to reduce obesity and cardiovascular risk factors.     Please  refer to Patient Instructions for Blood Glucose Monitoring and Insulin/Medications Dosing  Guide"  in media tab for additional information. Please  also refer to " Patient Self Inventory" in the Media  tab for reviewed elements of pertinent patient history.  Sonya Kline participated in the discussions, expressed understanding, and voiced agreement with the above plans.  All questions were answered to her satisfaction. she is encouraged to contact clinic should she have any questions or concerns prior to her return visit.    Follow up plan: - Return in about 6 months (around 04/04/2023) for Diabetes F/U with A1c in office, Previsit labs.  Ronny Bacon, [facility] [address] [place], [place] [address] Phone: [phone] Fax: [phone]  10/02/2022, 9:33 AM

## 2022-10-10 ENCOUNTER — Ambulatory Visit (HOSPITAL_COMMUNITY)
Admission: RE | Admit: 2022-10-10 | Discharge: 2022-10-10 | Disposition: A | Discharge status: Home / Self Care | Payer: BC Managed Care – PPO | Source: Ambulatory Visit | Attending: Urology | Admitting: Urology

## 2022-10-10 DIAGNOSIS — I878 Other specified disorders of veins: Secondary | ICD-10-CM | POA: Diagnosis not present

## 2022-10-10 DIAGNOSIS — N200 Calculus of kidney: Secondary | ICD-10-CM | POA: Diagnosis not present

## 2022-10-11 ENCOUNTER — Ambulatory Visit: Payer: BC Managed Care – PPO | Admitting: Urology

## 2022-10-11 VITALS — BP 111/71 | HR 97

## 2022-10-11 DIAGNOSIS — N200 Calculus of kidney: Secondary | ICD-10-CM

## 2022-10-11 LAB — MICROSCOPIC EXAMINATION
Epithelial Cells (non renal): >10 /hpf — AB (ref 0–10)
RBC, Urine: >30 /hpf — AB (ref 0–2)

## 2022-10-11 LAB — URINALYSIS, ROUTINE W REFLEX MICROSCOPIC
Bilirubin, UA: NEGATIVE
Glucose, UA: NEGATIVE
Ketones, UA: NEGATIVE
Leukocytes,UA: NEGATIVE
Nitrite, UA: NEGATIVE
Protein,UA: NEGATIVE
Specific Gravity, UA: 1.02 (ref 1.005–1.030)
Urobilinogen, Ur: 1 mg/dL (ref 0.2–1.0)
pH, UA: 6 (ref 5.0–7.5)

## 2022-10-11 MED ORDER — TAMSULOSIN HCL 0.4 MG PO CAPS
0.4000 mg | ORAL_CAPSULE | Freq: Every day | ORAL | 0 refills | Status: DC
Start: 2022-10-11 — End: 2022-11-20

## 2022-10-11 MED ORDER — OXYCODONE-ACETAMINOPHEN 5-325 MG PO TABS
1.0000 | ORAL_TABLET | ORAL | 0 refills | Status: DC | PRN
Start: 2022-10-11 — End: 2022-11-20

## 2022-10-11 NOTE — Progress Notes (Unsigned)
10/11/2022 11:17 AM   Sonya Kline Nov 03, 1970 [id number]  Referring provider: Carylon Perches, MD [address] [place],  [place] [address]  No chief complaint on file.   HPI:    PMH: Past Medical History:  Diagnosis Date   Diabetes mellitus, type II (HCC)    Diverticulosis    HTN (hypertension)    Kidney stone     Surgical History: Past Surgical History:  Procedure Laterality Date   ABDOMINAL HYSTERECTOMY     BALLOON DILATION N/A 06/07/2021   Procedure: BALLOON DILATION;  Surgeon: Lanelle Bal, DO;  [facility];  Service: Endoscopy;  Laterality: N/A;   CESAREAN SECTION     x 2   COLONOSCOPY N/A 08/22/2019   Procedure: COLONOSCOPY;  Surgeon: Corbin Ade, MD;  [facility];  Service: Endoscopy;  Laterality: N/A;  9:30   ESOPHAGOGASTRODUODENOSCOPY (EGD) WITH PROPOFOL N/A 06/07/2021   Procedure: ESOPHAGOGASTRODUODENOSCOPY (EGD) WITH PROPOFOL;  Surgeon: Lanelle Bal, DO;  [facility];  Service: Endoscopy;  Laterality: N/A;  3:00pm   EXTRACORPOREAL SHOCK WAVE LITHOTRIPSY Right 02/15/2021   Procedure: EXTRACORPOREAL SHOCK WAVE LITHOTRIPSY (ESWL);  Surgeon: Milderd Meager., MD;  Location: [facility];  Service: Urology;  Laterality: Right;   POLYPECTOMY  08/22/2019   Procedure: POLYPECTOMY;  Surgeon: Corbin Ade, MD;  [facility];  Service: Endoscopy;;    Home Medications:  Allergies as of 10/11/2022       Reactions   Contrast Media [iodinated Contrast Media] Hives   Prednisone Rash        Medication List        Accurate as of October 11, 2022 11:17 AM. If you have any questions, ask your nurse or doctor.          amLODipine 10 MG tablet Commonly known as: NORVASC Take 10 mg by mouth daily.   BD Pen Needle Nano 2nd Gen 32G X 4 MM Misc Generic drug: Insulin Pen Needle SMARTSIG:Pre-Filled Pen Syringe Injection Daily   rosuvastatin 5 MG tablet Commonly known as: CRESTOR TAKE 1 TABLET(5 MG) BY  MOUTH DAILY   Trulicity 4.5 MG/0.5ML Sopn Generic drug: Dulaglutide Inject 4.5 mg as directed once a week.        Allergies:  Allergies  Allergen Reactions   Contrast Media [Iodinated Contrast Media] Hives   Prednisone Rash    Family History: Family History  Problem Relation Age of Onset   Diabetes Mother    Hypertension Mother    Hypertension Father     Social History:  reports that she has never smoked. She has never used smokeless tobacco. She reports that she does not drink alcohol and does not use drugs.  ROS: All other review of systems were reviewed and are negative except what is noted above in HPI  Physical Exam: BP 111/71   Pulse 97   LMP 11/14/2011   Constitutional:  Alert and oriented, No acute distress. HEENT: [place] AT, moist mucus membranes.  Trachea midline, no masses. Cardiovascular: No clubbing, cyanosis, or edema. Respiratory: Normal respiratory effort, no increased work of breathing. GI: Abdomen is soft, nontender, nondistended, no abdominal masses GU: No CVA tenderness.  Lymph: No cervical or inguinal lymphadenopathy. Skin: No rashes, bruises or suspicious lesions. Neurologic: Grossly intact, no focal deficits, moving all 4 extremities. Psychiatric: Normal mood and affect.  Laboratory Data: Lab Results  Component Value Date   WBC 11.6 (H) 09/24/2022   HGB 13.8 09/24/2022   HCT  43.8 09/24/2022   MCV 80.8 09/24/2022   PLT 361 09/24/2022    Lab Results  Component Value Date   CREATININE 0.71 09/24/2022    No results found for: "PSA"  No results found for: "TESTOSTERONE"  Lab Results  Component Value Date   HGBA1C 6.1 (A) 10/02/2022    Urinalysis    Component Value Date/Time   COLORURINE YELLOW 09/24/2022 0831   APPEARANCEUR HAZY (A) 09/24/2022 0831   APPEARANCEUR Clear 04/12/2021 1006   LABSPEC 1.011 09/24/2022 0831   PHURINE 7.0 09/24/2022 0831   GLUCOSEU NEGATIVE 09/24/2022 0831   HGBUR LARGE (A) 09/24/2022 0831    BILIRUBINUR NEGATIVE 09/24/2022 0831   BILIRUBINUR Negative 04/12/2021 1006   KETONESUR NEGATIVE 09/24/2022 0831   PROTEINUR NEGATIVE 09/24/2022 0831   UROBILINOGEN 0.2 10/17/2008 1602   NITRITE NEGATIVE 09/24/2022 0831   LEUKOCYTESUR NEGATIVE 09/24/2022 0831    Lab Results  Component Value Date   LABMICR See below: 04/12/2021   WBCUA 0-5 04/12/2021   LABEPIT >10 (A) 04/12/2021   MUCUS Present 04/12/2021   BACTERIA NONE SEEN 09/24/2022    Pertinent Imaging: *** Results for orders placed during the hospital encounter of 10/10/21  Abdomen 1 view (KUB)  Narrative CLINICAL DATA:  Nephrolithiasis.  EXAM: ABDOMEN - 1 VIEW  COMPARISON:  Abdominal x-ray 03/01/2021. Renal ultrasound 03/28/2021.  FINDINGS: The bowel gas pattern is normal. Phleboliths are present in the pelvis. Likely calcification overlying the lower pole the left kidney versus bowel content measuring 4 mm. Osseous structures are within normal limits.  IMPRESSION: 1. Left renal calculus. 2. Nonobstructive bowel gas pattern.   Electronically Signed By: Darliss Cheney M.D. On: 10/10/2021 16:25  No results found for this or any previous visit.  No results found for this or any previous visit.  No results found for this or any previous visit.  Results for orders placed during the hospital encounter of 03/28/21  Ultrasound renal complete  Narrative CLINICAL DATA:  Nephrolithiasis post lithotripsy.  EXAM: RENAL / URINARY TRACT ULTRASOUND COMPLETE  COMPARISON:  04/24/2018 and CT 02/01/2021  FINDINGS: Right Kidney:  Renal measurements: 10.4 x 5 1 x 6.4 cm = volume: 179 mL. Echogenicity within normal limits. No mass or hydronephrosis visualized.  Left Kidney:  Renal measurements: 10.1 x 6.5 x 6.5 cm = volume: 223 ML. Echogenicity within normal limits. No mass or hydronephrosis visualized. 8 mm stone over the lower pole unchanged from prior CT.  Bladder:  Appears normal for degree of  bladder distention. Right ureteral jet visualized.  Other:  None.  IMPRESSION: Normal size kidneys without hydronephrosis. 8 mm stone over the lower pole left kidney unchanged.   Electronically Signed By: Elberta Fortis M.D. On: 03/28/2021 15:51  No valid procedures specified. No results found for this or any previous visit.  Results for orders placed during the hospital encounter of 09/24/22  CT Renal Stone Study  Narrative CLINICAL DATA:  Right-sided abdominal and flank pain. Stone suspected. Personal history of nephrolithiasis. The patient states this pain feels like pain with previous obstructions.  EXAM: CT ABDOMEN AND PELVIS WITHOUT CONTRAST  TECHNIQUE: Multidetector CT imaging of the abdomen and pelvis was performed following the standard protocol without IV contrast.  RADIATION DOSE REDUCTION: This exam was performed according to the departmental dose-optimization program which includes automated exposure control, adjustment of the mA and/or kV according to patient size and/or use of iterative reconstruction technique.  COMPARISON:  One-view abdomen 10/10/2021. CT renal stone protocol 02/01/2021  FINDINGS: Lower chest:  The lung bases are clear without focal nodule, mass, or airspace disease. The heart size is normal. No significant pleural or pericardial effusion is present.  Hepatobiliary: No focal liver abnormality is seen. No gallstones, gallbladder wall thickening, or biliary dilatation.  Pancreas: Unremarkable. No pancreatic ductal dilatation or surrounding inflammatory changes.  Spleen: Normal in size without focal abnormality.  Adrenals/Urinary Tract: Adrenal glands are normal bilaterally. Moderate right-sided hydronephrosis is present. The right ureter is dilated with some stranding to the level of an obstructing 3 mm stone just above the UVJ. A 7 mm nonobstructing stone is present at the lower pole of the left kidney as seen on previous  plain film radiograph. No other nephrolithiasis is present. No other masses scratched at no mass lesion is present. Left ureter is within normal limits. The urinary bladder is normal.  Stomach/Bowel: The stomach and duodenum are within normal limits. Small bowel is unremarkable. The terminal ileum is normal. Appendix is visualized and normal. Diverticular changes are present in the ascending colon without inflammation. The transverse colon is within normal limits. Diverticular changes are present in the distal descending and sigmoid colon without inflammatory change.  Vascular/Lymphatic: Scattered atherosclerotic changes are present in the abdominal aorta and branch vessels. No aneurysm is present.  Reproductive: A 4.7 cm scratched at a 4.8 cm cyst is again noted in the left adnexa. A peripheral calcification is noted along the cyst. Patient is status post hysterectomy. The right adnexa is unremarkable.  Other: No abdominal wall hernia or abnormality. No abdominopelvic ascites.  Musculoskeletal: No acute or significant osseous findings.  IMPRESSION: 1. Obstructing 3 mm stone in the distal right ureter with moderate right-sided hydronephrosis. 2. 7 mm nonobstructing stone at the lower pole of the left kidney. 3. Colonic diverticulosis without diverticulitis. 4. Stable 4.8 cm cyst in the left adnexa. Given stability over 2.5 years, no follow-up imaging is recommended. 5.  Aortic Atherosclerosis (ICD10-I70.0).   Electronically Signed By: Marin Roberts M.D. On: 09/24/2022 09:33   Assessment & Plan:    1. Kidney stones -We discussed the management of kidney stones. These options include observation, ureteroscopy, shockwave lithotripsy (ESWL) and percutaneous nephrolithotomy (PCNL). We discussed which options are relevant to the patient's stone(s). We discussed the natural history of kidney stones as well as the complications of untreated stones and the impact on quality  of life without treatment as well as with each of the above listed treatments. We also discussed the efficacy of each treatment in its ability to clear the stone burden. With any of these management options I discussed the signs and symptoms of infection and the need for emergent treatment should these be experienced. For each option we discussed the ability of each procedure to clear the patient of their stone burden.   For observation I described the risks which include but are not limited to silent renal damage, life-threatening infection, need for emergent surgery, failure to pass stone and pain.   For ureteroscopy I described the risks which include bleeding, infection, damage to contiguous structures, positioning injury, ureteral stricture, ureteral avulsion, ureteral injury, need for prolonged ureteral stent, inability to perform ureteroscopy, need for an interval procedure, inability to clear stone burden, stent discomfort/pain, heart attack, stroke, pulmonary embolus and the inherent risks with general anesthesia.   For shockwave lithotripsy I described the risks which include arrhythmia, kidney contusion, kidney hemorrhage, need for transfusion, pain, inability to adequately break up stone, inability to pass stone fragments, Steinstrasse, infection associated with obstructing stones, need  for alternate surgical procedure, need for repeat shockwave lithotripsy, MI, CVA, PE and the inherent risks with anesthesia/conscious sedation.   For PCNL I described the risks including positioning injury, pneumothorax, hydrothorax, need for chest tube, inability to clear stone burden, renal laceration, arterial venous fistula or malformation, need for embolization of kidney, loss of kidney or renal function, need for repeat procedure, need for prolonged nephrostomy tube, ureteral avulsion, MI, CVA, PE and the inherent risks of general anesthesia.   - The patient would like to proceed with medical expulsive  therapy. Followup 2-3 weeks with KUB - Urinalysis, Routine w reflex microscopic   No follow-ups on file.  Wilkie Aye, MD  [facility] [place]

## 2022-10-12 ENCOUNTER — Encounter: Payer: Self-pay | Admitting: Urology

## 2022-10-12 NOTE — Patient Instructions (Signed)

## 2022-11-02 NOTE — H&P (View-Only) (Signed)
Name: Sonya Kline DOB: [birth year] MRN: [id number]  History of Present Illness: Sonya Kline is a 52 y.o. female who presents today for follow up visit at [facility]. - GU History: 1. Kidney stones. - 02/15/2021: Underwent ESWL by Dr. Pete Glatter.  Recent history:  > 09/24/2022: Seen in ER for right flank pain. CT showed an obstructing 3 mm stone in the distal right ureter with moderate right-sided hydronephrosis. Also has a 7 mm nonobstructing stone at the lower pole of the left kidney.  > 10/11/2022: Seen by Dr. Ronne Binning. KUB showed a right distal ureteral calculus; patient was asymptomatic. The plan was Flomax for medical expulsive therapy x2 weeks.  Today: KUB today: Awaiting radiology read; appears unchanged compared to prior.  She denies stone passage. She denies acute flank pain / abdominal pain. She denies fevers.  She denies nausea/ vomiting.  She denies increased urinary urgency, frequency, nocturia, dysuria, gross hematuria, hesitancy, straining to void, or sensations of incomplete emptying.   Fall Screening: Do you usually have a device to assist in your mobility? No   Medications: Current Outpatient Medications  Medication Sig Dispense Refill   amLODipine (NORVASC) 10 MG tablet Take 10 mg by mouth daily.      BD PEN NEEDLE NANO 2ND GEN 32G X 4 MM MISC SMARTSIG:Pre-Filled Pen Syringe Injection Daily (Patient not taking: Reported on 10/02/2022)     Dulaglutide (TRULICITY) 4.5 MG/0.5ML SOPN Inject 4.5 mg as directed once a week. 6 mL 3   oxyCODONE-acetaminophen (PERCOCET) 5-325 MG tablet Take 1 tablet by mouth every 4 (four) hours as needed. 30 tablet 0   rosuvastatin (CRESTOR) 5 MG tablet TAKE 1 TABLET(5 MG) BY MOUTH DAILY (Patient not taking: Reported on 10/02/2022) 90 tablet 0   tamsulosin (FLOMAX) 0.4 MG CAPS capsule Take 1 capsule (0.4 mg total) by mouth daily. 30 capsule 0   No current facility-administered medications for this visit.     Allergies: Allergies  Allergen Reactions   Contrast Media [Iodinated Contrast Media] Hives   Prednisone Rash    Past Medical History:  Diagnosis Date   Diabetes mellitus, type II (HCC)    Diverticulosis    HTN (hypertension)    Kidney stone    Past Surgical History:  Procedure Laterality Date   ABDOMINAL HYSTERECTOMY     BALLOON DILATION N/A 06/07/2021   Procedure: BALLOON DILATION;  Surgeon: Lanelle Bal, DO;  [facility];  Service: Endoscopy;  Laterality: N/A;   CESAREAN SECTION     x 2   COLONOSCOPY N/A 08/22/2019   Procedure: COLONOSCOPY;  Surgeon: Corbin Ade, MD;  [facility];  Service: Endoscopy;  Laterality: N/A;  9:30   ESOPHAGOGASTRODUODENOSCOPY (EGD) WITH PROPOFOL N/A 06/07/2021   Procedure: ESOPHAGOGASTRODUODENOSCOPY (EGD) WITH PROPOFOL;  Surgeon: Lanelle Bal, DO;  [facility];  Service: Endoscopy;  Laterality: N/A;  3:00pm   EXTRACORPOREAL SHOCK WAVE LITHOTRIPSY Right 02/15/2021   Procedure: EXTRACORPOREAL SHOCK WAVE LITHOTRIPSY (ESWL);  Surgeon: Milderd Meager., MD;  Location: [facility];  Service: Urology;  Laterality: Right;   POLYPECTOMY  08/22/2019   Procedure: POLYPECTOMY;  Surgeon: Corbin Ade, MD;  [facility];  Service: Endoscopy;;   Family History  Problem Relation Age of Onset   Diabetes Mother    Hypertension Mother    Hypertension Father    Social History   Socioeconomic History   Marital status: Married    Spouse name: Not on file  Number of children: Not on file   Years of education: college   Highest education level: Not on file  Occupational History   Not on file  Tobacco Use   Smoking status: Never   Smokeless tobacco: Never  Vaping Use   Vaping status: Never Used  Substance and Sexual Activity   Alcohol use: No   Drug use: No   Sexual activity: Yes    Birth control/protection: None, Surgical  Other Topics Concern   Not on file  Social History Narrative   Not  on file   Social Determinants of Health   Financial Resource Strain: Not on file  Food Insecurity: Not on file  Transportation Needs: Not on file  Physical Activity: Sufficiently Active (02/25/2018)   [facility] from [facility], [facility]   Exercise Vital Sign    Days of Exercise per Week: 3 days    Minutes of Exercise per Session: 60 min  Stress: Not on file  Social Connections: Not on file  Intimate Partner Violence: Not At Risk (02/25/2018)   Received from [facility], [facility]   Humiliation, Afraid, Rape, and Kick questionnaire    Fear of Current or Ex-Partner: No    Emotionally Abused: No    Physically Abused: No    Sexually Abused: No    SUBJECTIVE  Review of Systems Constitutional: Patient denies any unintentional weight loss or change in strength lntegumentary: Patient denies any rashes or pruritus Cardiovascular: Patient denies chest pain or syncope Respiratory: Patient denies shortness of breath Gastrointestinal: Patient denies nausea, vomiting, constipation, or diarrhea Musculoskeletal: Patient denies muscle cramps or weakness Neurologic: Patient denies convulsions or seizures Psychiatric: Patient denies memory problems Allergic/Immunologic: Patient denies recent allergic reaction(s) Hematologic/Lymphatic: Patient denies bleeding tendencies Endocrine: Patient denies heat/cold intolerance  GU: As per HPI.  OBJECTIVE Vitals:   11/07/22 1143  BP: 111/65  Pulse: (!) 104  Temp: 98.4 F (36.9 C)   There is no height or weight on file to calculate BMI.  Physical Examination Constitutional: No obvious distress; patient is non-toxic appearing  Cardiovascular: No visible lower extremity edema.  Respiratory: The patient does not have audible wheezing/stridor; respirations do not appear labored  Gastrointestinal: Abdomen non-distended Musculoskeletal: Normal ROM of UEs  Skin: No obvious rashes/open sores  Neurologic: CN 2-12 grossly  intact Psychiatric: Answered questions appropriately with normal affect  Hematologic/Lymphatic/Immunologic: No obvious bruises or sites of spontaneous bleeding  UA: >30 RBC/hpf, bacteria (moderate)  ASSESSMENT Kidney stones - Plan: Urinalysis, Routine w reflex microscopic  We reviewed recent imaging results; awaiting radiology results, appears unchanged compared to prior - right distal ureteral stone still appears to be present, which is the likely cause for microhematuria per today's UA. She remains asymptomatic. Advised to continue Flomax 0.4 mg daily for medical expulsive therapy at this time; will consult with Dr. Ronne Binning to determine next steps and notify patient of his recommendations. Patient was advised this will likely include surgical intervention via ESWL or ureteroscopic stone manipulation given that the stone has been present for >30 days. We discussed possible risks and benefits of each option including but not limited to: including pain, infection, sepsis, UTI, ureter perforation, need for stenting, post-op ureteral stricture, hematuria. Pt verbalized understanding and agreement. All questions were answered.  PLAN Advised the following: Consult note sent to Dr. Ronne Binning.  Orders Placed This Encounter  Procedures   Urinalysis, Routine w reflex microscopic   It has been explained that the patient is to follow regularly  with their PCP in addition to all other providers involved in their care and to follow instructions provided by these respective offices. Patient advised to contact urology clinic if any urologic-pertaining questions, concerns, new symptoms or problems arise in the interim period.  There are no Patient Instructions on file for this visit.  Electronically signed by:  Donnita Falls, MSN, FNP-C, CUNP 11/07/2022 12:36 PM

## 2022-11-02 NOTE — Progress Notes (Signed)
Name: Sonya Kline DOB: [birth year] MRN: [id number]  History of Present Illness: Sonya Kline is a 52 y.o. female who presents today for follow up visit at [facility]. - GU History: 1. Kidney stones. - 02/15/2021: Underwent ESWL by Dr. Pete Glatter.  Recent history:  > 09/24/2022: Seen in ER for right flank pain. CT showed an obstructing 3 mm stone in the distal right ureter with moderate right-sided hydronephrosis. Also has a 7 mm nonobstructing stone at the lower pole of the left kidney.  > 10/11/2022: Seen by Dr. Ronne Binning. KUB showed a right distal ureteral calculus; patient was asymptomatic. The plan was Flomax for medical expulsive therapy x2 weeks.  Today: KUB today: Awaiting radiology read; appears unchanged compared to prior.  She denies stone passage. She denies acute flank pain / abdominal pain. She denies fevers.  She denies nausea/ vomiting.  She denies increased urinary urgency, frequency, nocturia, dysuria, gross hematuria, hesitancy, straining to void, or sensations of incomplete emptying.   Fall Screening: Do you usually have a device to assist in your mobility? No   Medications: Current Outpatient Medications  Medication Sig Dispense Refill   amLODipine (NORVASC) 10 MG tablet Take 10 mg by mouth daily.      BD PEN NEEDLE NANO 2ND GEN 32G X 4 MM MISC SMARTSIG:Pre-Filled Pen Syringe Injection Daily (Patient not taking: Reported on 10/02/2022)     Dulaglutide (TRULICITY) 4.5 MG/0.5ML SOPN Inject 4.5 mg as directed once a week. 6 mL 3   oxyCODONE-acetaminophen (PERCOCET) 5-325 MG tablet Take 1 tablet by mouth every 4 (four) hours as needed. 30 tablet 0   rosuvastatin (CRESTOR) 5 MG tablet TAKE 1 TABLET(5 MG) BY MOUTH DAILY (Patient not taking: Reported on 10/02/2022) 90 tablet 0   tamsulosin (FLOMAX) 0.4 MG CAPS capsule Take 1 capsule (0.4 mg total) by mouth daily. 30 capsule 0   No current facility-administered medications for this visit.     Allergies: Allergies  Allergen Reactions   Contrast Media [Iodinated Contrast Media] Hives   Prednisone Rash    Past Medical History:  Diagnosis Date   Diabetes mellitus, type II (HCC)    Diverticulosis    HTN (hypertension)    Kidney stone    Past Surgical History:  Procedure Laterality Date   ABDOMINAL HYSTERECTOMY     BALLOON DILATION N/A 06/07/2021   Procedure: BALLOON DILATION;  Surgeon: Lanelle Bal, DO;  [facility];  Service: Endoscopy;  Laterality: N/A;   CESAREAN SECTION     x 2   COLONOSCOPY N/A 08/22/2019   Procedure: COLONOSCOPY;  Surgeon: Corbin Ade, MD;  [facility];  Service: Endoscopy;  Laterality: N/A;  9:30   ESOPHAGOGASTRODUODENOSCOPY (EGD) WITH PROPOFOL N/A 06/07/2021   Procedure: ESOPHAGOGASTRODUODENOSCOPY (EGD) WITH PROPOFOL;  Surgeon: Lanelle Bal, DO;  [facility];  Service: Endoscopy;  Laterality: N/A;  3:00pm   EXTRACORPOREAL SHOCK WAVE LITHOTRIPSY Right 02/15/2021   Procedure: EXTRACORPOREAL SHOCK WAVE LITHOTRIPSY (ESWL);  Surgeon: Milderd Meager., MD;  Location: [facility];  Service: Urology;  Laterality: Right;   POLYPECTOMY  08/22/2019   Procedure: POLYPECTOMY;  Surgeon: Corbin Ade, MD;  [facility];  Service: Endoscopy;;   Family History  Problem Relation Age of Onset   Diabetes Mother    Hypertension Mother    Hypertension Father    Social History   Socioeconomic History   Marital status: Married    Spouse name: Not on file  Number of children: Not on file   Years of education: college   Highest education level: Not on file  Occupational History   Not on file  Tobacco Use   Smoking status: Never   Smokeless tobacco: Never  Vaping Use   Vaping status: Never Used  Substance and Sexual Activity   Alcohol use: No   Drug use: No   Sexual activity: Yes    Birth control/protection: None, Surgical  Other Topics Concern   Not on file  Social History Narrative   Not  on file   Social Determinants of Health   Financial Resource Strain: Not on file  Food Insecurity: Not on file  Transportation Needs: Not on file  Physical Activity: Sufficiently Active (02/25/2018)   Received from [facility], [facility]   Exercise Vital Sign    Days of Exercise per Week: 3 days    Minutes of Exercise per Session: 60 min  Stress: Not on file  Social Connections: Not on file  Intimate Partner Violence: Not At Risk (02/25/2018)   Received from [facility], [facility]   Humiliation, Afraid, Rape, and Kick questionnaire    Fear of Current or Ex-Partner: No    Emotionally Abused: No    Physically Abused: No    Sexually Abused: No    SUBJECTIVE  Review of Systems Constitutional: Patient denies any unintentional weight loss or change in strength lntegumentary: Patient denies any rashes or pruritus Cardiovascular: Patient denies chest pain or syncope Respiratory: Patient denies shortness of breath Gastrointestinal: Patient denies nausea, vomiting, constipation, or diarrhea Musculoskeletal: Patient denies muscle cramps or weakness Neurologic: Patient denies convulsions or seizures Psychiatric: Patient denies memory problems Allergic/Immunologic: Patient denies recent allergic reaction(s) Hematologic/Lymphatic: Patient denies bleeding tendencies Endocrine: Patient denies heat/cold intolerance  GU: As per HPI.  OBJECTIVE Vitals:   11/07/22 1143  BP: 111/65  Pulse: (!) 104  Temp: 98.4 F (36.9 C)   There is no height or weight on file to calculate BMI.  Physical Examination Constitutional: No obvious distress; patient is non-toxic appearing  Cardiovascular: No visible lower extremity edema.  Respiratory: The patient does not have audible wheezing/stridor; respirations do not appear labored  Gastrointestinal: Abdomen non-distended Musculoskeletal: Normal ROM of UEs  Skin: No obvious rashes/open sores  Neurologic: CN 2-12 grossly  intact Psychiatric: Answered questions appropriately with normal affect  Hematologic/Lymphatic/Immunologic: No obvious bruises or sites of spontaneous bleeding  UA: >30 RBC/hpf, bacteria (moderate)  ASSESSMENT Kidney stones - Plan: Urinalysis, Routine w reflex microscopic  We reviewed recent imaging results; awaiting radiology results, appears unchanged compared to prior - right distal ureteral stone still appears to be present, which is the likely cause for microhematuria per today's UA. She remains asymptomatic. Advised to continue Flomax 0.4 mg daily for medical expulsive therapy at this time; will consult with Dr. Ronne Binning to determine next steps and notify patient of his recommendations. Patient was advised this will likely include surgical intervention via ESWL or ureteroscopic stone manipulation given that the stone has been present for >30 days. We discussed possible risks and benefits of each option including but not limited to: including pain, infection, sepsis, UTI, ureter perforation, need for stenting, post-op ureteral stricture, hematuria. Pt verbalized understanding and agreement. All questions were answered.  PLAN Advised the following: Consult note sent to Dr. Ronne Binning.  Orders Placed This Encounter  Procedures   Urinalysis, Routine w reflex microscopic   It has been explained that the patient is to follow regularly  with their PCP in addition to all other providers involved in their care and to follow instructions provided by these respective offices. Patient advised to contact urology clinic if any urologic-pertaining questions, concerns, new symptoms or problems arise in the interim period.  There are no Patient Instructions on file for this visit.  Electronically signed by:  Donnita Falls, MSN, FNP-C, CUNP 11/07/2022 12:36 PM

## 2022-11-07 ENCOUNTER — Ambulatory Visit: Payer: BC Managed Care – PPO | Admitting: Urology

## 2022-11-07 ENCOUNTER — Ambulatory Visit (HOSPITAL_COMMUNITY)
Admission: RE | Admit: 2022-11-07 | Discharge: 2022-11-07 | Disposition: A | Discharge status: Home / Self Care | Payer: BC Managed Care – PPO | Source: Ambulatory Visit | Attending: Urology | Admitting: Urology

## 2022-11-07 ENCOUNTER — Encounter: Payer: Self-pay | Admitting: Urology

## 2022-11-07 VITALS — BP 111/65 | HR 104 | Temp 98.4°F

## 2022-11-07 DIAGNOSIS — N200 Calculus of kidney: Secondary | ICD-10-CM

## 2022-11-07 LAB — URINALYSIS, ROUTINE W REFLEX MICROSCOPIC
Bilirubin, UA: NEGATIVE
Glucose, UA: NEGATIVE
Ketones, UA: NEGATIVE
Leukocytes,UA: NEGATIVE
Nitrite, UA: NEGATIVE
Protein,UA: NEGATIVE
Specific Gravity, UA: 1.025 (ref 1.005–1.030)
Urobilinogen, Ur: 1 mg/dL (ref 0.2–1.0)
pH, UA: 6 (ref 5.0–7.5)

## 2022-11-07 LAB — MICROSCOPIC EXAMINATION: RBC, Urine: >30 /hpf — AB (ref 0–2)

## 2022-11-15 ENCOUNTER — Other Ambulatory Visit: Payer: Self-pay | Admitting: Urology

## 2022-11-15 DIAGNOSIS — N201 Calculus of ureter: Secondary | ICD-10-CM

## 2022-11-20 ENCOUNTER — Ambulatory Visit: Payer: BC Managed Care – PPO | Admitting: Orthopedic Surgery

## 2022-11-20 ENCOUNTER — Encounter: Payer: BC Managed Care – PPO | Admitting: Orthopedic Surgery

## 2022-11-20 ENCOUNTER — Encounter: Payer: Self-pay | Admitting: Orthopedic Surgery

## 2022-11-20 ENCOUNTER — Other Ambulatory Visit: Payer: Self-pay

## 2022-11-20 ENCOUNTER — Other Ambulatory Visit (INDEPENDENT_AMBULATORY_CARE_PROVIDER_SITE_OTHER): Payer: BC Managed Care – PPO

## 2022-11-20 VITALS — BP 119/66 | HR 103 | Ht 62.0 in | Wt 203.0 lb

## 2022-11-20 DIAGNOSIS — M25531 Pain in right wrist: Secondary | ICD-10-CM

## 2022-11-20 DIAGNOSIS — M25461 Effusion, right knee: Secondary | ICD-10-CM | POA: Diagnosis not present

## 2022-11-20 DIAGNOSIS — M25561 Pain in right knee: Secondary | ICD-10-CM

## 2022-11-20 DIAGNOSIS — N201 Calculus of ureter: Secondary | ICD-10-CM

## 2022-11-20 MED ORDER — METHYLPREDNISOLONE ACETATE 40 MG/ML IJ SUSP
40.0000 mg | Freq: Once | INTRAMUSCULAR | Status: AC
Start: 2022-11-20 — End: 2022-11-20
  Administered 2022-11-20: 40 mg via INTRA_ARTICULAR
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
## 2022-11-23 ENCOUNTER — Other Ambulatory Visit: Payer: Self-pay

## 2022-11-23 MED ORDER — TRULICITY 4.5 MG/0.5ML ~~LOC~~ SOAJ: 4.5000 mg | SUBCUTANEOUS | 1 refills | Status: DC

## 2022-11-24 ENCOUNTER — Telehealth: Payer: Self-pay | Admitting: Nurse Practitioner

## 2022-11-24 ENCOUNTER — Other Ambulatory Visit: Payer: Self-pay

## 2022-11-24 ENCOUNTER — Other Ambulatory Visit: Payer: Self-pay | Admitting: *Deleted

## 2022-11-24 ENCOUNTER — Encounter (HOSPITAL_COMMUNITY): Payer: Self-pay

## 2022-11-24 ENCOUNTER — Encounter (HOSPITAL_COMMUNITY)
Admission: RE | Admit: 2022-11-24 | Discharge: 2022-11-24 | Disposition: A | Discharge status: Home / Self Care | Payer: BC Managed Care – PPO | Source: Ambulatory Visit | Attending: Urology | Admitting: Urology

## 2022-11-24 DIAGNOSIS — E1165 Type 2 diabetes mellitus with hyperglycemia: Secondary | ICD-10-CM

## 2022-11-24 MED ORDER — TRULICITY 4.5 MG/0.5ML ~~LOC~~ SOAJ: 4.5000 mg | SUBCUTANEOUS | 2 refills | Status: DC

## 2022-11-24 NOTE — Telephone Encounter (Signed)
Talked with the patient. She is asking that we send in the prescription to the [facility] on [address] in [place] for the 4.5 dose. This has been done.

## 2022-11-24 NOTE — Pre-Procedure Instructions (Signed)
Attempted pre-op phonecall. Left VM for her to call us back. 

## 2022-11-28 ENCOUNTER — Ambulatory Visit (HOSPITAL_COMMUNITY)
Admission: RE | Admit: 2022-11-28 | Discharge: 2022-11-28 | Disposition: A | Discharge status: Home / Self Care | Payer: BC Managed Care – PPO | Source: Ambulatory Visit | Attending: Urology | Admitting: Urology

## 2022-11-28 ENCOUNTER — Encounter (HOSPITAL_COMMUNITY)
Admission: RE | Disposition: A | Discharge status: Home / Self Care | Payer: Self-pay | Source: Ambulatory Visit | Attending: Urology

## 2022-11-28 DIAGNOSIS — Z87442 Personal history of urinary calculi: Secondary | ICD-10-CM | POA: Diagnosis not present

## 2022-11-28 DIAGNOSIS — I10 Essential (primary) hypertension: Secondary | ICD-10-CM | POA: Diagnosis not present

## 2022-11-28 DIAGNOSIS — N201 Calculus of ureter: Secondary | ICD-10-CM | POA: Insufficient documentation

## 2022-11-28 DIAGNOSIS — Z7985 Long-term (current) use of injectable non-insulin antidiabetic drugs: Secondary | ICD-10-CM | POA: Insufficient documentation

## 2022-11-28 DIAGNOSIS — Z91041 Radiographic dye allergy status: Secondary | ICD-10-CM | POA: Diagnosis not present

## 2022-11-28 DIAGNOSIS — N200 Calculus of kidney: Secondary | ICD-10-CM | POA: Diagnosis not present

## 2022-11-28 DIAGNOSIS — E119 Type 2 diabetes mellitus without complications: Secondary | ICD-10-CM | POA: Diagnosis not present

## 2022-11-28 HISTORY — PX: EXTRACORPOREAL SHOCK WAVE LITHOTRIPSY: SHX1557

## 2022-11-28 LAB — GLUCOSE, CAPILLARY: Glucose-Capillary: 109 mg/dL — ABNORMAL HIGH (ref 70–99)

## 2022-11-28 SURGERY — EXTRACORPOREAL SHOCK WAVE LITHOTRIPSY (ESWL)
Anesthesia: LOCAL | Laterality: Right

## 2022-11-28 MED ORDER — OXYCODONE-ACETAMINOPHEN 5-325 MG PO TABS
1.0000 | ORAL_TABLET | ORAL | 0 refills | Status: DC | PRN
Start: 2022-11-28 — End: 2023-04-09

## 2022-11-28 MED ORDER — DIAZEPAM 5 MG PO TABS
10.0000 mg | ORAL_TABLET | Freq: Once | ORAL | Status: AC
  Administered 2022-11-28: 10 mg via ORAL
  Filled 2022-11-28: qty 2

## 2022-11-28 MED ORDER — SODIUM CHLORIDE 0.9 % IV SOLN: INTRAVENOUS | Status: DC

## 2022-11-28 MED ORDER — DIPHENHYDRAMINE HCL 25 MG PO CAPS
25.0000 mg | ORAL_CAPSULE | ORAL | Status: AC
  Administered 2022-11-28: 25 mg via ORAL
  Filled 2022-11-28: qty 1

## 2022-11-28 MED ORDER — ONDANSETRON HCL 4 MG PO TABS: 4.0000 mg | ORAL_TABLET | Freq: Every day | ORAL | 1 refills | Status: DC | PRN

## 2022-11-28 MED ORDER — TAMSULOSIN HCL 0.4 MG PO CAPS: 0.4000 mg | ORAL_CAPSULE | Freq: Every day | ORAL | 0 refills | Status: DC

## 2022-11-28 NOTE — Interval H&P Note (Signed)
History and Physical Interval Note:  11/28/2022 8:11 AM  Sonya Kline  has presented today for surgery, with the diagnosis of right ureteral stone.  The various methods of treatment have been discussed with the patient and family. After consideration of risks, benefits and other options for treatment, the patient has consented to  Procedure(s): EXTRACORPOREAL SHOCK WAVE LITHOTRIPSY (ESWL) (Right) as a surgical intervention.  The patient's history has been reviewed, patient examined, no change in status, stable for surgery.  I have reviewed the patient's chart and labs.  Questions were answered to the patient's satisfaction.     Wilkie Aye

## 2022-11-30 ENCOUNTER — Encounter (HOSPITAL_COMMUNITY): Payer: Self-pay | Admitting: Urology

## 2022-12-04 ENCOUNTER — Other Ambulatory Visit: Payer: Self-pay | Admitting: Urology

## 2022-12-04 DIAGNOSIS — N200 Calculus of kidney: Secondary | ICD-10-CM

## 2022-12-04 DIAGNOSIS — Z09 Encounter for follow-up examination after completed treatment for conditions other than malignant neoplasm: Secondary | ICD-10-CM

## 2022-12-04 NOTE — Progress Notes (Deleted)
Name: Sonya Kline DOB: 16-Mar-1970 MRN: [id number]  Diagnoses: 1) Post-operative state  HPI: [name] presents post-operatively s/p right ESWL procedure on 11/28/2022 by Dr. Ronne Binning for management of a right distal ureteral stone.  Postop course: KUB today: Awaiting radiology read; ***  Today She reports ***  Pt reports urinating *** per day. Pt reports *** urinary stream. Pt {Actions; denies-reports:120008} urgency. Pt {Actions; denies-reports:120008} dysuria. Pt {Actions; denies-reports:120008}  gross hematuria. Pt {Actions; denies-reports:120008}  the need to strain to void. Pt {Actions; denies-reports:120008}  sensations of incomplete emptying.  Pt {Actions; denies-reports:120008}  flank pain. Pt {Actions; denies-reports:120008}  abdominal pain.   Fall Screening: Do you usually have a device to assist in your mobility? {yes/no:20286} ***cane / ***walker / ***wheelchair   Medications: Current Outpatient Medications  Medication Sig Dispense Refill   amLODipine (NORVASC) 10 MG tablet Take 10 mg by mouth daily.      BD PEN NEEDLE NANO 2ND GEN 32G X 4 MM MISC      Dulaglutide (TRULICITY) 4.5 MG/0.5ML SOPN Inject 4.5 mg as directed once a week. 6 mL 2   ondansetron (ZOFRAN) 4 MG tablet Take 1 tablet (4 mg total) by mouth daily as needed for nausea or vomiting. 30 tablet 1   oxyCODONE-acetaminophen (PERCOCET) 5-325 MG tablet Take 1 tablet by mouth every 4 (four) hours as needed for severe pain (pain score 7-10). 30 tablet 0   tamsulosin (FLOMAX) 0.4 MG CAPS capsule Take 1 capsule (0.4 mg total) by mouth daily after supper. 30 capsule 0   No current facility-administered medications for this visit.    Allergies: Allergies  Allergen Reactions   Contrast Media [Iodinated Contrast Media] Hives   Prednisone Rash    Past Medical History:  Diagnosis Date   Diabetes mellitus, type II (HCC)    Diverticulosis    HTN (hypertension)    Kidney stone    Past Surgical  History:  Procedure Laterality Date   ABDOMINAL HYSTERECTOMY     BALLOON DILATION N/A 06/07/2021   Procedure: BALLOON DILATION;  Surgeon: Lanelle Bal, DO;  [facility];  Service: Endoscopy;  Laterality: N/A;   CESAREAN SECTION     x 2   COLONOSCOPY N/A 08/22/2019   Procedure: COLONOSCOPY;  Surgeon: Corbin Ade, MD;  [facility];  Service: Endoscopy;  Laterality: N/A;  9:30   ESOPHAGOGASTRODUODENOSCOPY (EGD) WITH PROPOFOL N/A 06/07/2021   Procedure: ESOPHAGOGASTRODUODENOSCOPY (EGD) WITH PROPOFOL;  Surgeon: Lanelle Bal, DO;  [facility];  Service: Endoscopy;  Laterality: N/A;  3:00pm   EXTRACORPOREAL SHOCK WAVE LITHOTRIPSY Right 02/15/2021   Procedure: EXTRACORPOREAL SHOCK WAVE LITHOTRIPSY (ESWL);  Surgeon: Milderd Meager., MD;  Location: [facility];  Service: Urology;  Laterality: Right;   EXTRACORPOREAL SHOCK WAVE LITHOTRIPSY Right 11/28/2022   Procedure: EXTRACORPOREAL SHOCK WAVE LITHOTRIPSY (ESWL);  Surgeon: Malen Gauze, MD;  Location: [facility];  Service: Urology;  Laterality: Right;   POLYPECTOMY  08/22/2019   Procedure: POLYPECTOMY;  Surgeon: Corbin Ade, MD;  [facility];  Service: Endoscopy;;   Family History  Problem Relation Age of Onset   Diabetes Mother    Hypertension Mother    Hypertension Father    Social History   Socioeconomic History   Marital status: Married    Spouse name: Not on file   Number of children: Not on file   Years of education: college   Highest education level: Not on file  Occupational History  Not on file  Tobacco Use   Smoking status: Never   Smokeless tobacco: Never  Vaping Use   Vaping status: Never Used  Substance and Sexual Activity   Alcohol use: No   Drug use: No   Sexual activity: Yes    Birth control/protection: None, Surgical  Other Topics Concern   Not on file  Social History Narrative   Not on file   Social Determinants of Health   Financial Resource  Strain: Not on file  Food Insecurity: Not on file  Transportation Needs: Not on file  Physical Activity: Sufficiently Active (02/25/2018)   Received from [facility], [facility]   Exercise Vital Sign    Days of Exercise per Week: 3 days    Minutes of Exercise per Session: 60 min  Stress: Not on file  Social Connections: Not on file  Intimate Partner Violence: Not At Risk (02/25/2018)   Received from [facility], [facility]   Humiliation, Afraid, Rape, and Kick questionnaire    Fear of Current or Ex-Partner: No    Emotionally Abused: No    Physically Abused: No    Sexually Abused: No    SUBJECTIVE  Review of Systems*** Constitutional: Patient denies any unintentional weight loss or change in strength lntegumentary: Patient denies any rashes or pruritus Eyes: Patient denies ***dry eyes ENT: Patient ***denies dry mouth Cardiovascular: Patient denies chest pain or syncope Respiratory: Patient denies shortness of breath Gastrointestinal: Patient ***denies nausea, vomiting, constipation, or diarrhea Musculoskeletal: Patient denies muscle cramps or weakness Neurologic: Patient denies convulsions or seizures Allergic/Immunologic: Patient denies recent allergic reaction(s) Hematologic/Lymphatic: Patient denies bleeding tendencies Endocrine: Patient denies heat/cold intolerance  GU: As per HPI.  OBJECTIVE There were no vitals filed for this visit. There is no height or weight on file to calculate BMI.  Physical Examination*** Constitutional: No obvious distress; patient is non-toxic appearing  Cardiovascular: No visible lower extremity edema.  Respiratory: The patient does not have audible wheezing/stridor; respirations do not appear labored  Gastrointestinal: Abdomen non-distended Musculoskeletal: Normal ROM of UEs  Skin: No obvious rashes/open sores  Neurologic: CN 2-12 grossly intact Psychiatric: Answered questions appropriately with normal affect   Hematologic/Lymphatic/Immunologic: No obvious bruises or sites of spontaneous bleeding  UA: ***negative *** WBC/hpf, *** RBC/hpf, bacteria (***) PVR: *** ml  ASSESSMENT No diagnosis found.  We reviewed the operative procedures and findings. She is doing ***well. Pre-operative symptoms are *** since the procedure. We reviewed recent imaging results; ***awaiting radiology results, appears to have ***no acute findings.  ***For stone prevention: Advised adequate hydration and we discussed option to consider low oxalate diet given that calcium oxalate is the most common type of stone. Handout provided about stone prevention diet.  ***For recurrent stone formers: We discussed option to proceed with 24 hour urinalysis ([facility]) for metabolic evaluation, which may help with targeted recommendations for dietary I medication therapies for stone prevention. Patient elected to ***proceed/ ***hold off.  Will plan to follow up in ***6 months / ***1 year with KUB ***RUS for stone surveillance or sooner if needed.  Pt verbalized understanding and agreement. All questions were answered.  PLAN Advised the following: ***Maintain adequate fluid intake. ***Low oxalate diet. No follow-ups on file.  *** Billing: 90 day global period for ESWL ***  No orders of the defined types were placed in this encounter.   It has been explained that the patient is to follow regularly with their PCP in addition to all other providers involved in  their care and to follow instructions provided by these respective offices. Patient advised to contact urology clinic if any urologic-pertaining questions, concerns, new symptoms or problems arise in the interim period.  There are no Patient Instructions on file for this visit.  Electronically signed by:  Donnita Falls, MSN, FNP-C, CUNP 12/04/2022 3:44 PM

## 2022-12-07 ENCOUNTER — Ambulatory Visit (HOSPITAL_COMMUNITY)
Admission: RE | Admit: 2022-12-07 | Discharge: 2022-12-07 | Disposition: A | Discharge status: Home / Self Care | Payer: BC Managed Care – PPO | Source: Ambulatory Visit | Attending: Urology | Admitting: Urology

## 2022-12-07 DIAGNOSIS — Z09 Encounter for follow-up examination after completed treatment for conditions other than malignant neoplasm: Secondary | ICD-10-CM | POA: Diagnosis not present

## 2022-12-07 DIAGNOSIS — I878 Other specified disorders of veins: Secondary | ICD-10-CM | POA: Diagnosis not present

## 2022-12-07 DIAGNOSIS — N200 Calculus of kidney: Secondary | ICD-10-CM | POA: Diagnosis not present

## 2022-12-12 ENCOUNTER — Encounter: Payer: BC Managed Care – PPO | Admitting: Urology

## 2022-12-12 DIAGNOSIS — N201 Calculus of ureter: Secondary | ICD-10-CM

## 2022-12-12 DIAGNOSIS — Z09 Encounter for follow-up examination after completed treatment for conditions other than malignant neoplasm: Secondary | ICD-10-CM

## 2022-12-12 DIAGNOSIS — N200 Calculus of kidney: Secondary | ICD-10-CM

## 2022-12-20 ENCOUNTER — Encounter: Payer: Self-pay | Admitting: Urology

## 2022-12-20 ENCOUNTER — Other Ambulatory Visit: Payer: Self-pay | Admitting: Urology

## 2022-12-20 ENCOUNTER — Ambulatory Visit: Payer: BC Managed Care – PPO | Admitting: Urology

## 2022-12-20 VITALS — BP 128/79 | HR 105 | Temp 98.5°F

## 2022-12-20 DIAGNOSIS — N201 Calculus of ureter: Secondary | ICD-10-CM

## 2022-12-20 DIAGNOSIS — N200 Calculus of kidney: Secondary | ICD-10-CM

## 2022-12-20 DIAGNOSIS — Z87442 Personal history of urinary calculi: Secondary | ICD-10-CM

## 2022-12-20 DIAGNOSIS — Z09 Encounter for follow-up examination after completed treatment for conditions other than malignant neoplasm: Secondary | ICD-10-CM

## 2022-12-20 LAB — URINALYSIS, ROUTINE W REFLEX MICROSCOPIC
Bilirubin, UA: NEGATIVE
Glucose, UA: NEGATIVE
Ketones, UA: NEGATIVE
Leukocytes,UA: NEGATIVE
Nitrite, UA: NEGATIVE
Protein,UA: NEGATIVE
Specific Gravity, UA: 1.02 (ref 1.005–1.030)
Urobilinogen, Ur: 0.2 mg/dL (ref 0.2–1.0)
pH, UA: 6 (ref 5.0–7.5)

## 2022-12-20 LAB — MICROSCOPIC EXAMINATION: Bacteria, UA: NONE SEEN

## 2022-12-20 MED ORDER — TAMSULOSIN HCL 0.4 MG PO CAPS
0.4000 mg | ORAL_CAPSULE | Freq: Every day | ORAL | 0 refills | Status: DC
Start: 2022-12-20 — End: 2023-04-09

## 2022-12-20 NOTE — Progress Notes (Signed)
Reviewed KUB images with Dr. Ronne Binning after today's visit. Difficult to interpret due to multiple phleboliths in the surrounding tissue. Per Dr. Ronne Binning the 3 mm distal right ureteral stone is still present (appeared round on the 11/28/2022 KUB and jagged on 12/07/2022 KUB ("smudged")). He advised that patient should continue medical expulsive therapy with Flomax 0.4 mg daily for another 2 weeks then follow up with repeat KUB. Will notify patient.   Evette Georges, MSN, FNP-C, [facility]rology Nurse Practitioner [facility] [place]

## 2022-12-20 NOTE — Patient Instructions (Signed)

## 2023-01-01 NOTE — Progress Notes (Deleted)
Name: Sonya Kline DOB: 12/17/1970 MRN: [id number]  History of Present Illness: Sonya Kline is a 52 y.o. female who presents today for follow up visit at [facility] [place]. - GU History: 1. Kidney stones.  Recent history:  > 11/28/2022: Underwent right ESWL procedure by Dr. Ronne Binning for management of a right distal ureteral stone.   > 12/07/2022: KUB showed a 6 mm intrarenal left kidney stone.   > 12/20/2022: Postop visit. Asymptomatic.  Reviewed KUB images with Dr. Ronne Binning after visit. Difficult to interpret due to multiple phleboliths in the surrounding tissue. Per Dr. Ronne Binning the 3 mm distal right ureteral stone still present (appeared round on the 11/28/2022 KUB and jagged on 12/07/2022 KUB ("smudged")). He advised that patient should continue medical expulsive therapy with Flomax 0.4 mg daily for another 2 weeks then follow up with repeat KUB.   Today: KUB today: Awaiting radiology read; ***  She {Actions; denies-reports:120008} recent stone passage. She {Actions; denies-reports:120008} flank pain or abdominal pain. She {Actions; denies-reports:120008} fevers, nausea, or vomiting.  She {Actions; denies-reports:120008} increased urinary urgency, frequency, nocturia, dysuria, gross hematuria, hesitancy, straining to void, or sensations of incomplete emptying.   Fall Screening: Do you usually have a device to assist in your mobility? {yes/no:20286}  ***cane / ***walker / ***wheelchair  Medications: Current Outpatient Medications  Medication Sig Dispense Refill   amLODipine (NORVASC) 10 MG tablet Take 10 mg by mouth daily.      BD PEN NEEDLE NANO 2ND GEN 32G X 4 MM MISC      Dulaglutide (TRULICITY) 4.5 MG/0.5ML SOPN Inject 4.5 mg as directed once a week. 6 mL 2   ondansetron (ZOFRAN) 4 MG tablet Take 1 tablet (4 mg total) by mouth daily as needed for nausea or vomiting. 30 tablet 1   oxyCODONE-acetaminophen (PERCOCET) 5-325 MG tablet Take 1 tablet by mouth every  4 (four) hours as needed for severe pain (pain score 7-10). 30 tablet 0   tamsulosin (FLOMAX) 0.4 MG CAPS capsule Take 1 capsule (0.4 mg total) by mouth daily after supper. 14 capsule 0   No current facility-administered medications for this visit.    Allergies: Allergies  Allergen Reactions   Contrast Media [Iodinated Contrast Media] Hives   Prednisone Rash    Past Medical History:  Diagnosis Date   Diabetes mellitus, type II (HCC)    Diverticulosis    HTN (hypertension)    Kidney stone    Past Surgical History:  Procedure Laterality Date   ABDOMINAL HYSTERECTOMY     BALLOON DILATION N/A 06/07/2021   Procedure: BALLOON DILATION;  Surgeon: Lanelle Bal, DO;  [facility];  Service: Endoscopy;  Laterality: N/A;   CESAREAN SECTION     x 2   COLONOSCOPY N/A 08/22/2019   Procedure: COLONOSCOPY;  Surgeon: Corbin Ade, MD;  Location: [facility];  Service: Endoscopy;  Laterality: N/A;  9:30   ESOPHAGOGASTRODUODENOSCOPY (EGD) WITH PROPOFOL N/A 06/07/2021   Procedure: ESOPHAGOGASTRODUODENOSCOPY (EGD) WITH PROPOFOL;  Surgeon: Lanelle Bal, DO;  Location: [facility];  Service: Endoscopy;  Laterality: N/A;  3:00pm   EXTRACORPOREAL SHOCK WAVE LITHOTRIPSY Right 02/15/2021   Procedure: EXTRACORPOREAL SHOCK WAVE LITHOTRIPSY (ESWL);  Surgeon: Milderd Meager., MD;  Location: [facility];  Service: Urology;  Laterality: Right;   EXTRACORPOREAL SHOCK WAVE LITHOTRIPSY Right 11/28/2022   Procedure: EXTRACORPOREAL SHOCK WAVE LITHOTRIPSY (ESWL);  Surgeon: Malen Gauze, MD;  Location: [facility];  Service: Urology;  Laterality: Right;   POLYPECTOMY  08/22/2019   Procedure: POLYPECTOMY;  Surgeon: Corbin Ade, MD;  Location: [facility];  Service: Endoscopy;;   Family History  Problem Relation Age of Onset   Diabetes Mother    Hypertension Mother    Hypertension Father    Social History   Socioeconomic History   Marital status: Married    Spouse name: Not on file    Number of children: Not on file   Years of education: college   Highest education level: Not on file  Occupational History   Not on file  Tobacco Use   Smoking status: Never   Smokeless tobacco: Never  Vaping Use   Vaping status: Never Used  Substance and Sexual Activity   Alcohol use: No   Drug use: No   Sexual activity: Yes    Birth control/protection: None, Surgical  Other Topics Concern   Not on file  Social History Narrative   Not on file   Social Determinants of Health   Financial Resource Strain: Not on file  Food Insecurity: Not on file  Transportation Needs: Not on file  Physical Activity: Sufficiently Active (02/25/2018)   Received from [facility], [facility]   Exercise Vital Sign    Days of Exercise per Week: 3 days    Minutes of Exercise per Session: 60 min  Stress: Not on file  Social Connections: Not on file  Intimate Partner Violence: Not At Risk (02/25/2018)   Received from [facility], [facility]   Humiliation, Afraid, Rape, and Kick questionnaire    Fear of Current or Ex-Partner: No    Emotionally Abused: No    Physically Abused: No    Sexually Abused: No    SUBJECTIVE  Review of Systems*** Constitutional: Patient denies any unintentional weight loss or change in strength lntegumentary: Patient denies any rashes or pruritus Cardiovascular: Patient denies chest pain or syncope Respiratory: Patient denies shortness of breath Gastrointestinal: Patient ***denies nausea, vomiting, constipation, or diarrhea Musculoskeletal: Patient denies muscle cramps or weakness Neurologic: Patient denies convulsions or seizures Allergic/Immunologic: Patient denies recent allergic reaction(s) Hematologic/Lymphatic: Patient denies bleeding tendencies Endocrine: Patient denies heat/cold intolerance  GU: As per HPI.  OBJECTIVE There were no vitals filed for this visit. There is no height or weight on file to calculate BMI.  Physical  Examination*** Constitutional: No obvious distress; patient is non-toxic appearing  Cardiovascular: No visible lower extremity edema.  Respiratory: The patient does not have audible wheezing/stridor; respirations do not appear labored  Gastrointestinal: Abdomen non-distended Musculoskeletal: Normal ROM of UEs  Skin: No obvious rashes/open sores  Neurologic: CN 2-12 grossly intact Psychiatric: Answered questions appropriately with normal affect  Hematologic/Lymphatic/Immunologic: No obvious bruises or sites of spontaneous bleeding  UA: ***negative *** WBC/hpf, *** RBC/hpf, bacteria (***) PVR: *** ml  ASSESSMENT No diagnosis found.  ***We reviewed recent imaging results; ***awaiting radiology results, appears to have ***no acute findings.  ***For stone prevention: Advised adequate hydration and we discussed option to consider low oxalate diet given that calcium oxalate is the most common type of stone. Handout provided about stone prevention diet.  ***For recurrent stone formers: We discussed option to proceed with 24 hour urinalysis ([facility]) for metabolic evaluation, which may help with targeted recommendations for dietary I medication therapies for stone prevention. Patient elected to ***proceed/ ***hold off.  Will plan to follow up in ***6 months / ***1 year with ***KUB ***RUS for stone surveillance or sooner if needed.  Pt verbalized understanding and agreement. All questions  were answered.  PLAN Advised the following: Maintain adequate fluid intake daily. Drink citrus juice (lemon, lime or orange juice) routinely. Low oxalate diet. No follow-ups on file.  No orders of the defined types were placed in this encounter.   It has been explained that the patient is to follow regularly with their PCP in addition to all other providers involved in their care and to follow instructions provided by these respective offices. Patient advised to contact urology clinic if any  urologic-pertaining questions, concerns, new symptoms or problems arise in the interim period.  There are no Patient Instructions on file for this visit.  Electronically signed by:  Donnita Falls, MSN, FNP-C, CUNP 01/01/2023 2:45 PM

## 2023-01-02 ENCOUNTER — Ambulatory Visit: Payer: BC Managed Care – PPO | Admitting: Urology

## 2023-01-08 ENCOUNTER — Ambulatory Visit: Payer: BC Managed Care – PPO | Admitting: Urology

## 2023-01-08 DIAGNOSIS — N200 Calculus of kidney: Secondary | ICD-10-CM

## 2023-02-27 DIAGNOSIS — Z113 Encounter for screening for infections with a predominantly sexual mode of transmission: Secondary | ICD-10-CM | POA: Diagnosis not present

## 2023-03-21 NOTE — Progress Notes (Signed)
  Intake history:  LMP 11/14/2011  There is no height or weight on file to calculate BMI.

## 2023-03-23 ENCOUNTER — Ambulatory Visit (INDEPENDENT_AMBULATORY_CARE_PROVIDER_SITE_OTHER): Payer: BC Managed Care – PPO | Admitting: Orthopedic Surgery

## 2023-03-23 DIAGNOSIS — M25461 Effusion, right knee: Secondary | ICD-10-CM

## 2023-03-23 MED ORDER — METHYLPREDNISOLONE ACETATE 40 MG/ML IJ SUSP
40.0000 mg | Freq: Once | INTRAMUSCULAR | Status: AC
Start: 2023-03-23 — End: 2023-03-23
  Administered 2023-03-23: 40 mg via INTRA_ARTICULAR

## 2023-03-23 NOTE — Progress Notes (Signed)
   LMP 11/14/2011   There is no height or weight on file to calculate BMI.  Chief Complaint  Patient presents with   Knee Pain    Bil knee pain     No diagnosis found.  DOI/DOS/ Date: 2 days  Worse

## 2023-03-23 NOTE — Progress Notes (Signed)
   Chief Complaint  Patient presents with   Knee Pain    Bil knee pain    52 female history of swelling right knee.  When she made the appointment both knees were swollen now as the right knee feels tight.  Usually comes in to get fluid removed.  Not on any medication for the knee right now.  Exam shows slight effusion pain at terminal flexion with tightness  Aspiration injection right knee  Ace, ice, 48 hours.  Return if needed.  Procedure aspiration injection right knee   Procedure note injection and aspiration right knee joint  Verbal consent was obtained to aspirate and inject the right knee joint   Timeout was completed to confirm the site of aspiration and injection  An 18-gauge needle was used to aspirate the knee joint from a suprapatellar lateral approach.  The medications used were 40 mg of Depo-Medrol  and 1% lidocaine  3 cc  Anesthesia was provided by ethyl chloride and the skin was prepped with alcohol.  After cleaning the skin with alcohol an 18-gauge needle was used to aspirate the right knee joint.  We obtained 6  cc of fluid clear yellow  We follow this by injection of 40 mg of Depo-Medrol  and 3 cc 1% lidocaine .  There were no complications. A sterile bandage was applied.

## 2023-03-23 NOTE — Addendum Note (Signed)
 Addended by: Georgann Kim on: 03/23/2023 09:29 AM   Modules accepted: Orders

## 2023-03-28 DIAGNOSIS — Z8719 Personal history of other diseases of the digestive system: Secondary | ICD-10-CM | POA: Diagnosis not present

## 2023-03-28 DIAGNOSIS — E1165 Type 2 diabetes mellitus with hyperglycemia: Secondary | ICD-10-CM | POA: Diagnosis not present

## 2023-03-28 DIAGNOSIS — M24159 Other articular cartilage disorders, unspecified hip: Secondary | ICD-10-CM | POA: Diagnosis not present

## 2023-03-28 DIAGNOSIS — I10 Essential (primary) hypertension: Secondary | ICD-10-CM | POA: Diagnosis not present

## 2023-03-28 DIAGNOSIS — E7800 Pure hypercholesterolemia, unspecified: Secondary | ICD-10-CM | POA: Diagnosis not present

## 2023-03-29 LAB — LIPID PANEL
Chol/HDL Ratio: 4.5 {ratio} — ABNORMAL HIGH (ref 0.0–4.4)
Cholesterol, Total: 245 mg/dL — ABNORMAL HIGH (ref 100–199)
HDL: 54 mg/dL (ref >39)
LDL Chol Calc (NIH): 168 mg/dL — ABNORMAL HIGH (ref 0–99)
Triglycerides: 127 mg/dL (ref 0–149)
VLDL Cholesterol Cal: 23 mg/dL (ref 5–40)

## 2023-03-29 LAB — COMPREHENSIVE METABOLIC PANEL
ALT: 12 [IU]/L (ref 0–32)
AST: 18 [IU]/L (ref 0–40)
Albumin: 3.9 g/dL (ref 3.8–4.9)
Alkaline Phosphatase: 93 [IU]/L (ref 44–121)
BUN/Creatinine Ratio: 20 (ref 9–23)
BUN: 16 mg/dL (ref 6–24)
Bilirubin Total: 0.3 mg/dL (ref 0.0–1.2)
CO2: 28 mmol/L (ref 20–29)
Calcium: 9.6 mg/dL (ref 8.7–10.2)
Chloride: 100 mmol/L (ref 96–106)
Creatinine, Ser: 0.8 mg/dL (ref 0.57–1.00)
Globulin, Total: 3.6 g/dL (ref 1.5–4.5)
Glucose: 111 mg/dL — ABNORMAL HIGH (ref 70–99)
Potassium: 4.6 mmol/L (ref 3.5–5.2)
Sodium: 143 mmol/L (ref 134–144)
Total Protein: 7.5 g/dL (ref 6.0–8.5)
eGFR: 88 mL/min/{1.73_m2} (ref >59)

## 2023-03-29 LAB — TSH: TSH: 1.77 u[IU]/mL (ref 0.450–4.500)

## 2023-03-29 LAB — T4, FREE: Free T4: 0.96 ng/dL (ref 0.82–1.77)

## 2023-04-04 ENCOUNTER — Ambulatory Visit: Payer: BC Managed Care – PPO | Admitting: Nurse Practitioner

## 2023-04-05 ENCOUNTER — Encounter (HOSPITAL_COMMUNITY): Payer: Self-pay

## 2023-04-05 ENCOUNTER — Emergency Department (HOSPITAL_COMMUNITY)
Admission: EM | Admit: 2023-04-05 | Discharge: 2023-04-05 | Disposition: A | Discharge status: Home / Self Care | Payer: BC Managed Care – PPO | Attending: Emergency Medicine | Admitting: Emergency Medicine

## 2023-04-05 ENCOUNTER — Other Ambulatory Visit: Payer: Self-pay

## 2023-04-05 DIAGNOSIS — Z79899 Other long term (current) drug therapy: Secondary | ICD-10-CM | POA: Insufficient documentation

## 2023-04-05 DIAGNOSIS — E119 Type 2 diabetes mellitus without complications: Secondary | ICD-10-CM | POA: Diagnosis not present

## 2023-04-05 DIAGNOSIS — R509 Fever, unspecified: Secondary | ICD-10-CM | POA: Diagnosis not present

## 2023-04-05 DIAGNOSIS — U071 COVID-19: Secondary | ICD-10-CM | POA: Insufficient documentation

## 2023-04-05 DIAGNOSIS — Z794 Long term (current) use of insulin: Secondary | ICD-10-CM | POA: Diagnosis not present

## 2023-04-05 DIAGNOSIS — I10 Essential (primary) hypertension: Secondary | ICD-10-CM | POA: Diagnosis not present

## 2023-04-05 LAB — RESP PANEL BY RT-PCR (RSV, FLU A&B, COVID)  RVPGX2
Influenza A by PCR: NEGATIVE
Influenza B by PCR: NEGATIVE
Resp Syncytial Virus by PCR: NEGATIVE
SARS Coronavirus 2 by RT PCR: POSITIVE — AB

## 2023-04-05 MED ORDER — ACETAMINOPHEN 325 MG PO TABS
650.0000 mg | ORAL_TABLET | Freq: Once | ORAL | Status: AC | PRN
  Administered 2023-04-05: 650 mg via ORAL
  Filled 2023-04-05: qty 2

## 2023-04-05 NOTE — Discharge Instructions (Signed)
 Drink plenty of fluids and get plenty of rest.  Take ibuprofen 600 mg rotated with Tylenol 1000 mg every 4 hours as needed for pain or fever.  Take over-the-counter medications as needed for relief of symptoms.  Isolate at home for the next several days or until no longer symptomatic.

## 2023-04-05 NOTE — ED Triage Notes (Signed)
 PT complains of headache, chills and stomach ache that started yesterday. Nausea and diarrhea.  Denies vomiting.

## 2023-04-05 NOTE — ED Provider Notes (Signed)
 [place] EMERGENCY DEPARTMENT AT [facility] Provider Note   CSN: [id number] Arrival date & time: 04/05/23  2024     History  Chief Complaint  Patient presents with   Headache   Chills    Sonya Kline is a 53 y.o. female.  Patient is a 53 year old female with past medical history of hypertension, type 2 diabetes, hyperlipidemia.  Patient presenting today for evaluation of headache, chills, and cough.  Symptoms started yesterday.  She denies any ill contacts.  She denies fever at home.  No chest pain or shortness of breath.  No aggravating or alleviating factors.  The history is provided by the patient.       Home Medications Prior to Admission medications   Medication Sig Start Date End Date Taking? Authorizing Provider  amLODipine (NORVASC) 10 MG tablet Take 10 mg by mouth daily.     [provider]  BD PEN NEEDLE NANO 2ND GEN 32G X 4 MM MISC  10/28/19   [provider]  Dulaglutide (TRULICITY) 4.5 MG/0.5ML SOPN Inject 4.5 mg as directed once a week. 11/24/22   Dani Gobble, NP  ondansetron (ZOFRAN) 4 MG tablet Take 1 tablet (4 mg total) by mouth daily as needed for nausea or vomiting. 11/28/22 11/28/23  Malen Gauze, MD  oxyCODONE-acetaminophen (PERCOCET) 5-325 MG tablet Take 1 tablet by mouth every 4 (four) hours as needed for severe pain (pain score 7-10). 11/28/22 11/28/23  Malen Gauze, MD  tamsulosin (FLOMAX) 0.4 MG CAPS capsule Take 1 capsule (0.4 mg total) by mouth daily after supper. 12/20/22   Donnita Falls, FNP      Allergies    Contrast media [iodinated contrast media] and Prednisone    Review of Systems   Review of Systems  All other systems reviewed and are negative.   Physical Exam Updated Vital Signs BP 107/70 (BP Location: Right Arm)   Pulse (!) 106   Temp 99.3 F (37.4 C) (Oral)   Resp 18   Ht 5\' 2"  (1.575 m)   Wt 92 kg   LMP 11/14/2011   SpO2 96%   BMI 37.10 kg/m  Physical Exam Vitals and  nursing note reviewed.  Constitutional:      General: She is not in acute distress.    Appearance: She is well-developed. She is not diaphoretic.  HENT:     Head: Normocephalic and atraumatic.     Mouth/Throat:     Mouth: Mucous membranes are moist.     Pharynx: Oropharynx is clear.  Cardiovascular:     Rate and Rhythm: Normal rate and regular rhythm.     Heart sounds: No murmur heard.    No friction rub. No gallop.  Pulmonary:     Effort: Pulmonary effort is normal. No respiratory distress.     Breath sounds: Normal breath sounds. No wheezing.  Abdominal:     General: Bowel sounds are normal. There is no distension.     Palpations: Abdomen is soft.     Tenderness: There is no abdominal tenderness.  Musculoskeletal:        General: Normal range of motion.     Cervical back: Normal range of motion and neck supple.  Skin:    General: Skin is warm and dry.  Neurological:     General: No focal deficit present.     Mental Status: She is alert and oriented to person, place, and time.     ED Results / Procedures / Treatments  Labs (all labs ordered are listed, but only abnormal results are displayed) Labs Reviewed  RESP PANEL BY RT-PCR (RSV, FLU A&B, COVID)  [id number] - Abnormal; Notable for the following components:      Result Value   SARS Coronavirus 2 by RT PCR POSITIVE (*)    All other components within normal limits    EKG None  Radiology No results found.  Procedures Procedures    Medications Ordered in ED Medications  acetaminophen (TYLENOL) tablet 650 mg (650 mg Oral Given 04/05/23 2034)    ED Course/ Medical Decision Making/ A&P  Patient is a 53 year old female presenting with headache, body aches, and chills.  She arrives with stable vital signs and is afebrile.  Physical examination basically unremarkable.  COVID test is positive.  Flu and RSV are negative.  Patient to be discharged with rotating Tylenol and Motrin, over-the-counter medications, and as  needed return.  Patient offered Paxlovid, however declines.  Final Clinical Impression(s) / ED Diagnoses Final diagnoses:  None    Rx / DC Orders ED Discharge Orders     None         Geoffery Lyons, MD 04/05/23 2329

## 2023-04-09 ENCOUNTER — Encounter: Payer: Self-pay | Admitting: Nurse Practitioner

## 2023-04-09 ENCOUNTER — Ambulatory Visit: Payer: BC Managed Care – PPO | Admitting: Nurse Practitioner

## 2023-04-09 VITALS — BP 124/76 | HR 95 | Ht 62.0 in | Wt 209.0 lb

## 2023-04-09 DIAGNOSIS — E1165 Type 2 diabetes mellitus with hyperglycemia: Secondary | ICD-10-CM | POA: Diagnosis not present

## 2023-04-09 DIAGNOSIS — I10 Essential (primary) hypertension: Secondary | ICD-10-CM | POA: Diagnosis not present

## 2023-04-09 DIAGNOSIS — Z7985 Long-term (current) use of injectable non-insulin antidiabetic drugs: Secondary | ICD-10-CM | POA: Diagnosis not present

## 2023-04-09 DIAGNOSIS — E559 Vitamin D deficiency, unspecified: Secondary | ICD-10-CM

## 2023-04-09 LAB — POCT GLYCOSYLATED HEMOGLOBIN (HGB A1C): Hemoglobin A1C: 6.6 % — AB (ref 4.0–5.6)

## 2023-04-09 MED ORDER — TRULICITY 4.5 MG/0.5ML ~~LOC~~ SOAJ
4.5000 mg | SUBCUTANEOUS | 3 refills | Status: DC
Start: 2023-04-09 — End: 2023-10-09

## 2023-04-09 NOTE — Progress Notes (Signed)
 04/09/2023, 9:36 AM  Endocrinology follow-up note   Subjective:    Patient ID: Sonya Kline, female    DOB: [birth year].  Sonya Kline is being seen in follow-up after she was seen in consultation for management of currently uncontrolled symptomatic diabetes requested by  Carylon Perches, MD.   Past Medical History:  Diagnosis Date   Diabetes mellitus, type II (HCC)    Diverticulosis    HTN (hypertension)    Kidney stone     Past Surgical History:  Procedure Laterality Date   ABDOMINAL HYSTERECTOMY     BALLOON DILATION N/A 06/07/2021   Procedure: BALLOON DILATION;  Surgeon: Lanelle Bal, DO;  [facility];  Service: Endoscopy;  Laterality: N/A;   CESAREAN SECTION     x 2   COLONOSCOPY N/A 08/22/2019   Procedure: COLONOSCOPY;  Surgeon: Corbin Ade, MD;  [facility];  Service: Endoscopy;  Laterality: N/A;  9:30   ESOPHAGOGASTRODUODENOSCOPY (EGD) WITH PROPOFOL N/A 06/07/2021   Procedure: ESOPHAGOGASTRODUODENOSCOPY (EGD) WITH PROPOFOL;  Surgeon: Lanelle Bal, DO;  [facility];  Service: Endoscopy;  Laterality: N/A;  3:00pm   EXTRACORPOREAL SHOCK WAVE LITHOTRIPSY Right 02/15/2021   Procedure: EXTRACORPOREAL SHOCK WAVE LITHOTRIPSY (ESWL);  Surgeon: Milderd Meager., MD;  Location: [facility];  Service: Urology;  Laterality: Right;   EXTRACORPOREAL SHOCK WAVE LITHOTRIPSY Right 11/28/2022   Procedure: EXTRACORPOREAL SHOCK WAVE LITHOTRIPSY (ESWL);  Surgeon: Malen Gauze, MD;  Location: [facility];  Service: Urology;  Laterality: Right;   POLYPECTOMY  08/22/2019   Procedure: POLYPECTOMY;  Surgeon: Corbin Ade, MD;  [facility];  Service: Endoscopy;;    Social History   Socioeconomic History   Marital status: Married    Spouse name: Not on file   Number of children: Not on file   Years of education: college   Highest education level: Not on file   Occupational History   Not on file  Tobacco Use   Smoking status: Never   Smokeless tobacco: Never  Vaping Use   Vaping status: Never Used  Substance and Sexual Activity   Alcohol use: No   Drug use: No   Sexual activity: Yes    Birth control/protection: None, Surgical  Other Topics Concern   Not on file  Social History Narrative   Not on file   Social Drivers of Health   Financial Resource Strain: Not on file  Food Insecurity: Not on file  Transportation Needs: Not on file  Physical Activity: Sufficiently Active (02/25/2018)   Received [facility]   Exercise Vital Sign    Days of Exercise per Week: 3 days    Minutes of Exercise per Session: 60 min  Stress: Not on file  Social Connections: Not on file    Family History  Problem Relation Age of Onset   Diabetes Mother    Hypertension Mother    Hypertension Father     Outpatient Encounter Medications as of 04/09/2023  Medication Sig   amLODipine (NORVASC) 10 MG tablet Take 10 mg by mouth daily.    [DISCONTINUED] Dulaglutide (  TRULICITY) 4.5 MG/0.5ML SOPN Inject 4.5 mg as directed once a week.   [DISCONTINUED] ondansetron (ZOFRAN) 4 MG tablet Take 1 tablet (4 mg total) by mouth daily as needed for nausea or vomiting.   [DISCONTINUED] oxyCODONE-acetaminophen (PERCOCET) 5-325 MG tablet Take 1 tablet by mouth every 4 (four) hours as needed for severe pain (pain score 7-10).   [DISCONTINUED] tamsulosin (FLOMAX) 0.4 MG CAPS capsule Take 1 capsule (0.4 mg total) by mouth daily after supper.   Dulaglutide (TRULICITY) 4.5 MG/0.5ML SOAJ Inject 4.5 mg as directed once a week.   [DISCONTINUED] BD PEN NEEDLE NANO 2ND GEN 32G X 4 MM MISC  (Patient not taking: Reported on 04/09/2023)   No facility-administered encounter medications on file as of 04/09/2023.    ALLERGIES: Allergies  Allergen Reactions   Contrast Media [Iodinated Contrast Media] Hives   Prednisone Rash    VACCINATION STATUS: Immunization  History  Administered Date(s) Administered   Rabies, IM 03/17/2013, 03/20/2013, 03/27/2013, 04/11/2013   Tdap 03/17/2013    Diabetes She presents for her follow-up diabetic visit. She has type 2 diabetes mellitus. Onset time: She was diagnosed at approximate age of 53 years. Her disease course has been stable. There are no hypoglycemic associated symptoms. Pertinent negatives for hypoglycemia include no confusion, headaches, pallor or seizures. Pertinent negatives for diabetes include no chest pain, no fatigue, no polydipsia, no polyphagia, no polyuria and no weight loss. There are no hypoglycemic complications. Symptoms are stable. There are no diabetic complications. Risk factors for coronary artery disease include diabetes mellitus, hypertension, obesity and dyslipidemia. Current diabetic treatments: Trulicity. She is compliant with treatment most of the time. Her weight is fluctuating minimally. She is following a generally healthy diet. When asked about meal planning, she reported none. She has not had a previous visit with a dietitian. She rarely participates in exercise. (She presents today with no meter or logs to review (was not asked to routinely monitor glucose given safe medication regimen).  Her POCT A1c today is 6.6%, increasing from last visit of 6.1%.  She has plans to start walking again once the weather warms up some.) An ACE inhibitor/angiotensin II receptor blocker is not being taken. She does not see a podiatrist.Eye exam is current.  Hypertension This is a chronic problem. The current episode started more than 1 year ago. The problem has been resolved since onset. The problem is controlled. Pertinent negatives include no chest pain, headaches, palpitations or shortness of breath. There are no associated agents to hypertension. Risk factors for coronary artery disease include diabetes mellitus, family history, obesity, sedentary lifestyle and dyslipidemia. Past treatments include  calcium channel blockers. The current treatment provides mild improvement. Compliance problems include diet and exercise.   Hyperlipidemia This is a chronic problem. The current episode started more than 1 year ago. The problem is uncontrolled. Recent lipid tests were reviewed and are high. Exacerbating diseases include diabetes. There are no known factors aggravating her hyperlipidemia. Pertinent negatives include no chest pain or shortness of breath. Current antihyperlipidemic treatment includes statins. The current treatment provides mild improvement of lipids. Compliance problems include adherence to diet and adherence to exercise.  Risk factors for coronary artery disease include diabetes mellitus, dyslipidemia, hypertension, obesity and a sedentary lifestyle.    Review of systems  Constitutional: + stable body weight,  current Body mass index is 38.23 kg/m. , no fatigue, no subjective hyperthermia, no subjective hypothermia Eyes: no blurry vision, no xerophthalmia ENT: no sore throat, no nodules palpated in throat, no  dysphagia/odynophagia, no hoarseness Cardiovascular: no chest pain, no shortness of breath, no palpitations, no leg swelling Respiratory: no cough, no shortness of breath Gastrointestinal: no nausea/vomiting/diarrhea Musculoskeletal: no muscle/joint aches Skin: no rashes, no hyperemia Neurological: no tremors, no numbness, no tingling, no dizziness Psychiatric: no depression, no anxiety   Objective:    BP 124/76 (BP Location: Left Arm, Patient Position: Sitting, Cuff Size: Large)   Pulse 95   Ht 5\' 2"  (1.575 m)   Wt 209 lb (94.8 kg)   LMP 11/14/2011   BMI 38.23 kg/m    Wt Readings from Last 3 Encounters:  04/09/23 209 lb (94.8 kg)  04/05/23 202 lb 13.2 oz (92 kg)  11/24/22 203 lb (92.1 kg)    BP Readings from Last 3 Encounters:  04/09/23 124/76  04/05/23 107/70  12/20/22 128/79     Physical Exam- Limited  Constitutional:  Body mass index is 38.23 kg/m.  , not in acute distress, normal state of mind Eyes:  EOMI, no exophthalmos Musculoskeletal: no gross deformities, strength intact in all four extremities, no gross restriction of joint movements Skin:  no rashes, no hyperemia Neurological: no tremor with outstretched hands   Diabetic Foot Exam - Simple   No data filed     CMP ( most recent) CMP     Component Value Date/Time   NA 143 03/28/2023 0817   K 4.6 03/28/2023 0817   CL 100 03/28/2023 0817   CO2 28 03/28/2023 0817   GLUCOSE 111 (H) 03/28/2023 0817   GLUCOSE 109 (H) 09/24/2022 0831   BUN 16 03/28/2023 0817   CREATININE 0.80 03/28/2023 0817   CALCIUM 9.6 03/28/2023 0817   PROT 7.5 03/28/2023 0817   ALBUMIN 3.9 03/28/2023 0817   AST 18 03/28/2023 0817   ALT 12 03/28/2023 0817   ALKPHOS 93 03/28/2023 0817   BILITOT 0.3 03/28/2023 0817   GFRNONAA >60 09/24/2022 0831   GFRAA 102 01/21/2020 0737     Diabetic Labs (most recent): Lab Results  Component Value Date   HGBA1C 6.6 (A) 04/09/2023   HGBA1C 6.1 (A) 10/02/2022   HGBA1C 8.2 (A) 04/03/2022   MICROALBUR 30 01/29/2020        Assessment & Plan:   1) Type 2 diabetes mellitus with hyperglycemia (HCC)  - NESREEN ALBANO has currently uncontrolled symptomatic type 2 DM since 53 years of age.  Recent labs reviewed.   She presents today with no meter or logs to review (was not asked to routinely monitor glucose given safe medication regimen).  Her POCT A1c today is 6.6%, increasing from last visit of 6.1%.  She has plans to start walking again once the weather warms up some.  - I had a long discussion with her about the progressive nature of diabetes and the pathology behind its complications. -She does not report gross complications of diabetes, however she remains at a high risk for more acute and chronic complications which include CAD, CVA, CKD, retinopathy, and neuropathy. These are all discussed in detail with her.  - Nutritional counseling repeated at  each appointment due to patients tendency to fall back in to old habits.  - The patient admits there is a room for improvement in their diet and drink choices. -  Suggestion is made for the patient to avoid simple carbohydrates from their diet including Cakes, Sweet Desserts / Pastries, Ice Cream, Soda (diet and regular), Sweet Tea, Candies, Chips, Cookies, Sweet Pastries, Store Bought Juices, Alcohol in Excess of 1-2 drinks a day,  Artificial Sweeteners, Coffee Creamer, and "Sugar-free" Products. This will help patient to have stable blood glucose profile and potentially avoid unintended weight gain.   - I encouraged the patient to switch to unprocessed or minimally processed complex starch and increased protein intake (animal or plant source), fruits, and vegetables.   - Patient is advised to stick to a routine mealtimes to eat 3 meals a day and avoid unnecessary snacks (to snack only to correct hypoglycemia).  - I have approached her with the following individualized plan to manage  her diabetes and patient agrees:   -She is advised to continue Trulicity 4.5 mg SQ weekly.    - she can take a break from routine glucose monitoring for now.  - Specific targets for  A1c;  LDL, HDL,  and Triglycerides were discussed with the patient.  2) Blood Pressure /Hypertension:  Her blood pressure is controlled to target.  She is advised to continue Norvasc 10 mg po daily.    3) Lipids/Hyperlipidemia:  Her most recent lipid panel from 03/28/23 shows uncontrolled LDL at 168 worsening.   She is advised to avoid fried foods and red meats.  Will hold off on initiating statin, had myalgias with it previously.  4)  Weight/Diet:  Her Body mass index is 38.23 kg/m.  -   clearly complicating her diabetes care.   she is a candidate for weight loss. I discussed with her the fact that loss of 5 - 10% of her  current body weight will have the most impact on her diabetes management.  Exercise, and detailed carbohydrates  information provided  -  detailed on discharge instructions.  5) Vitamin D Deficiency: Her recent vitamin D level from 11/24/21 was low at 26.7.  She is not currently on any supplementation.  She is advised to restart OTC Vitamin D3 5000 units daily.  6) Chronic Care/Health Maintenance: -she is not on ACEI/ARB is not on Statin medications and is encouraged to initiate and continue to follow up with Ophthalmology, Dentist,  Podiatrist at least yearly or according to recommendations, and advised to stay away from smoking. I have recommended yearly flu vaccine and pneumonia vaccine at least every 5 years; moderate intensity exercise for up to 150 minutes weekly; and  sleep for at least 7 hours a day.  - she is advised to maintain close follow up with Carylon Perches, MD for primary care needs, as well as her other providers for optimal and coordinated care.     I spent  35  minutes in the care of the patient today including review of labs from CMP, Lipids, Thyroid Function, Hematology (current and previous including abstractions from other facilities); face-to-face time discussing  her blood glucose readings/logs, discussing hypoglycemia and hyperglycemia episodes and symptoms, medications doses, her options of short and long term treatment based on the latest standards of care / guidelines;  discussion about incorporating lifestyle medicine;  and documenting the encounter. Risk reduction counseling performed per USPSTF guidelines to reduce obesity and cardiovascular risk factors.     Please refer to Patient Instructions for Blood Glucose Monitoring and Insulin/Medications Dosing Guide"  in media tab for additional information. Please  also refer to " Patient Self Inventory" in the Media  tab for reviewed elements of pertinent patient history.  Elsie Amis participated in the discussions, expressed understanding, and voiced agreement with the above plans.  All questions were answered to her satisfaction.  she is encouraged to contact clinic should she have any questions or  concerns prior to her return visit.    Follow up plan: - Return in about 6 months (around 10/07/2023) for Diabetes F/U with A1c in office, No previsit labs.  Ronny Bacon, [facility] [address] [place], [place] [address] Phone: [phone] Fax: [phone]  04/09/2023, 9:36 AM

## 2023-04-17 DIAGNOSIS — E1165 Type 2 diabetes mellitus with hyperglycemia: Secondary | ICD-10-CM | POA: Diagnosis not present

## 2023-04-17 DIAGNOSIS — I10 Essential (primary) hypertension: Secondary | ICD-10-CM | POA: Diagnosis not present

## 2023-04-17 DIAGNOSIS — E559 Vitamin D deficiency, unspecified: Secondary | ICD-10-CM | POA: Diagnosis not present

## 2023-04-17 DIAGNOSIS — Z79899 Other long term (current) drug therapy: Secondary | ICD-10-CM | POA: Diagnosis not present

## 2023-04-23 ENCOUNTER — Telehealth: Payer: Self-pay

## 2023-04-23 ENCOUNTER — Telehealth: Payer: Self-pay | Admitting: Nurse Practitioner

## 2023-04-23 ENCOUNTER — Other Ambulatory Visit (HOSPITAL_COMMUNITY): Payer: Self-pay

## 2023-04-23 NOTE — Telephone Encounter (Signed)
 Pharmacy Patient Advocate Encounter   Received notification from Pt Calls Messages that prior authorization for Trulicity is required/requested.   Insurance verification completed.   The patient is insured through [employer] .   Per test claim: PA required; PA submitted to above mentioned insurance via CoverMyMeds Key/confirmation #/EOC [facility] Status is pending

## 2023-04-23 NOTE — Telephone Encounter (Signed)
 Pt is checking on the status of her PA for Trulicity. She has been waiting 2 weeks.

## 2023-04-23 NOTE — Telephone Encounter (Signed)
 Do you have any idea how long this will be? Still 5 days?

## 2023-04-30 NOTE — Telephone Encounter (Signed)
 Received fax from [facility]:    I see notes stating she is intolerant to metformin and the symptoms it caused, but no information on start/stop dates, dose or frequency. Please advise

## 2023-04-30 NOTE — Telephone Encounter (Signed)
 Information has been faxed to insurance

## 2023-04-30 NOTE — Telephone Encounter (Signed)
 Pt made aware

## 2023-04-30 NOTE — Telephone Encounter (Signed)
 Her intolerance to Metformin was first noted on her initial consultation with Dr. Fransico Him back in 2021.  I could not find any specific reasoning for it.  Can you call and ask her about start and stop dates for her Metformin and the specific symptoms she was having or ask for the records from Dr. Karleen Hampshire office to support that?

## 2023-04-30 NOTE — Telephone Encounter (Signed)
 Pt states that insurance was supposed to send over for more information on the PA for Trulicity stating why she couldn't take Metformin.

## 2023-04-30 NOTE — Telephone Encounter (Signed)
 Ok, thanks.

## 2023-05-08 NOTE — Telephone Encounter (Signed)
 Approved through 05/01/2024

## 2023-05-09 NOTE — Telephone Encounter (Signed)
 Patient was called and made aware.

## 2023-05-11 DIAGNOSIS — E785 Hyperlipidemia, unspecified: Secondary | ICD-10-CM | POA: Diagnosis not present

## 2023-05-11 DIAGNOSIS — E1169 Type 2 diabetes mellitus with other specified complication: Secondary | ICD-10-CM | POA: Diagnosis not present

## 2023-05-11 DIAGNOSIS — I10 Essential (primary) hypertension: Secondary | ICD-10-CM | POA: Diagnosis not present

## 2023-05-17 ENCOUNTER — Ambulatory Visit: Admitting: Orthopedic Surgery

## 2023-06-07 ENCOUNTER — Encounter: Payer: Self-pay | Admitting: Emergency Medicine

## 2023-06-07 ENCOUNTER — Ambulatory Visit: Admission: EM | Admit: 2023-06-07 | Discharge: 2023-06-07 | Disposition: A | Discharge status: Home / Self Care

## 2023-06-07 ENCOUNTER — Other Ambulatory Visit: Payer: Self-pay

## 2023-06-07 DIAGNOSIS — R21 Rash and other nonspecific skin eruption: Secondary | ICD-10-CM | POA: Diagnosis not present

## 2023-06-07 MED ORDER — METHYLPREDNISOLONE ACETATE 40 MG/ML IJ SUSP
40.0000 mg | Freq: Once | INTRAMUSCULAR | Status: AC
  Administered 2023-06-07: 40 mg via INTRAMUSCULAR

## 2023-06-07 NOTE — ED Triage Notes (Signed)
 Pt reports was walking outside on Monday and reports once returned home that evening noticed "red itchy bumps" to bilateral posterior calves and left upper arm. Reports has been using calamine lotion with no change in itch. Denies pain or new self care products.

## 2023-06-07 NOTE — ED Provider Notes (Signed)
 [facility]    CSN: [id number] Arrival date & time: 06/07/23  0855      History   Chief Complaint Chief Complaint  Patient presents with   Rash    HPI Sonya Kline is a 53 y.o. female.   The history is provided by the patient.   Patient presents for complaints of rash to the left upper arm and to the back of her legs has been present for the past several days.  Patient states that 2 to 3 days ago, she was outside walking prior to noticing the rash.  Patient with "red itchy bumps" to the back of the left arm.  Patient states that the area is not painful.  She denies exposure to new medications, lotions, foods, or detergents.  States that she has been administering calamine lotion to the affected areas with minimal relief.  Patient with underlying history of diabetes, most recent A1c was 6.6 in February.  Past Medical History:  Diagnosis Date   Diabetes mellitus, type II (HCC)    Diverticulosis    HTN (hypertension)    Kidney stone     Patient Active Problem List   Diagnosis Date Noted   Kidney stones 03/02/2021   Hypercholesterolemia 11/02/2020   Type 2 diabetes mellitus (HCC) 11/02/2020   Uncontrolled type 2 diabetes mellitus with hyperglycemia (HCC) 10/29/2019   Essential hypertension, benign 10/29/2019   Labral tear of hip, degenerative 10/16/2013   DIVERTICULITIS, HX OF 03/09/2009    Past Surgical History:  Procedure Laterality Date   ABDOMINAL HYSTERECTOMY     BALLOON DILATION N/A 06/07/2021   Procedure: BALLOON DILATION;  Surgeon: Vinetta Greening, DO;  [facility];  Service: Endoscopy;  Laterality: N/A;   CESAREAN SECTION     x 2   COLONOSCOPY N/A 08/22/2019   Procedure: COLONOSCOPY;  Surgeon: Suzette Espy, MD;  [facility];  Service: Endoscopy;  Laterality: N/A;  9:30   ESOPHAGOGASTRODUODENOSCOPY (EGD) WITH PROPOFOL  N/A 06/07/2021   Procedure: ESOPHAGOGASTRODUODENOSCOPY (EGD) WITH PROPOFOL ;  Surgeon: Vinetta Greening,  DO;  [facility];  Service: Endoscopy;  Laterality: N/A;  3:00pm   EXTRACORPOREAL SHOCK WAVE LITHOTRIPSY Right 02/15/2021   Procedure: EXTRACORPOREAL SHOCK WAVE LITHOTRIPSY (ESWL);  Surgeon: Mellie Sprinkle., MD;  Location: [facility];  Service: Urology;  Laterality: Right;   EXTRACORPOREAL SHOCK WAVE LITHOTRIPSY Right 11/28/2022   Procedure: EXTRACORPOREAL SHOCK WAVE LITHOTRIPSY (ESWL);  Surgeon: Marco Severs, MD;  Location: [facility];  Service: Urology;  Laterality: Right;   POLYPECTOMY  08/22/2019   Procedure: POLYPECTOMY;  Surgeon: Suzette Espy, MD;  [facility];  Service: Endoscopy;;    OB History     Gravida  4   Para  2   Term  2   Preterm      AB  2   Living  2      SAB  2   IAB      Ectopic      Multiple      Live Births               Home Medications    Prior to Admission medications   Medication Sig Start Date End Date Taking? Authorizing Provider  rosuvastatin  (CRESTOR ) 20 MG tablet Take 20 mg by mouth daily. 05/12/23  Yes [provider]  amLODipine  (NORVASC ) 10 MG tablet Take 10 mg by mouth daily.     [provider]  Dulaglutide  (TRULICITY ) 4.5  MG/0.5ML SOAJ Inject 4.5 mg as directed once a week. 04/09/23   Wendel Hals, NP    Family History Family History  Problem Relation Age of Onset   Diabetes Mother    Hypertension Mother    Hypertension Father     Social History Social History   Tobacco Use   Smoking status: Never   Smokeless tobacco: Never  Vaping Use   Vaping status: Never Used  Substance Use Topics   Alcohol use: No   Drug use: No     Allergies   Contrast media [iodinated contrast media] and Prednisone    Review of Systems Review of Systems Per HPI  Physical Exam Triage Vital Signs ED Triage Vitals [06/07/23 0930]  Encounter Vitals Group     BP 118/71     Systolic BP Percentile      Diastolic BP Percentile      Pulse Rate 76     Resp 20     Temp 97.9 F  (36.6 C)     Temp Source Oral     SpO2 97 %     Weight      Height      Head Circumference      Peak Flow      Pain Score 0     Pain Loc      Pain Education      Exclude from Growth Chart    No data found.  Updated Vital Signs BP 118/71 (BP Location: Right Arm)   Pulse 76   Temp 97.9 F (36.6 C) (Oral)   Resp 20   LMP 11/14/2011   SpO2 97%   Visual Acuity Right Eye Distance:   Left Eye Distance:   Bilateral Distance:    Right Eye Near:   Left Eye Near:    Bilateral Near:     Physical Exam Vitals and nursing note reviewed.  Constitutional:      General: She is not in acute distress.    Appearance: Normal appearance.  HENT:     Head: Normocephalic.  Eyes:     Extraocular Movements: Extraocular movements intact.     Pupils: Pupils are equal, round, and reactive to light.  Pulmonary:     Effort: Pulmonary effort is normal.  Musculoskeletal:     Cervical back: Normal range of motion.  Skin:    General: Skin is warm and dry.     Findings: Rash present. Rash is macular and papular.     Comments: Nodular macular papular rash noted to the posterior left upper arm.  Areas are erythematous, and linear.  Macular papular rash also noted to the posterior calves.  Rash is in an annular formation.  There is no oozing, fluctuance, or drainage present.  Neurological:     General: No focal deficit present.     Mental Status: She is alert and oriented to person, place, and time.  Psychiatric:        Mood and Affect: Mood normal.        Behavior: Behavior normal.     UC Treatments / Results  Labs (all labs ordered are listed, but only abnormal results are displayed) Labs Reviewed - No data to display  EKG   Radiology No results found.  Procedures Procedures (including critical care time)  Medications Ordered in UC Medications - No data to display  Initial Impression / Assessment and Plan / UC Course  I have reviewed the triage vital signs and the nursing  notes.  Pertinent labs & imaging results that were available during my care of the patient were reviewed by me and considered in my medical decision making (see chart for details).  Symptoms consistent with contact dermatitis.  Will treat with Depo-Medrol  40 mg IM.  Will start triamcinolone cream 0.1% for patient to apply to the affected areas while symptoms persist.  Supportive care recommendations were provided and discussed with the patient to include over-the-counter antihistamines, cool compresses, and use of Aveeno colloidal oatmeal bath.  Patient was given indications regarding follow-up.  Patient was in agreement with this plan of care and verbalized understanding.  All questions were answered.  Patient stable for discharge.  Final Clinical Impressions(s) / UC Diagnoses   Final diagnoses:  None   Discharge Instructions   None    ED Prescriptions   None    PDMP not reviewed this encounter.   Hardy Lia, NP 06/07/23 [phone]

## 2023-06-07 NOTE — Discharge Instructions (Signed)
 You have been given an injection of Depo-Medrol  40 mg today. Apply medication as prescribed. May also take over-the-counter Zyrtec  during the daytime and Benadryl  at bedtime to help with itching. Avoid hot baths or showers while symptoms persist.  Recommend taking lukewarm baths. May apply cool cloths to the area to help with itching or discomfort. Avoid scratching, rubbing, or manipulating the areas while symptoms persist. Recommend Aveeno Colloidal Oatmeal bath to use to help with drying and itching. Follow up if symptoms do not improve.

## 2023-06-18 ENCOUNTER — Ambulatory Visit (HOSPITAL_COMMUNITY)
Admission: RE | Admit: 2023-06-18 | Discharge: 2023-06-18 | Disposition: A | Discharge status: Home / Self Care | Source: Ambulatory Visit | Attending: Urology | Admitting: Urology

## 2023-06-18 ENCOUNTER — Other Ambulatory Visit (HOSPITAL_COMMUNITY)

## 2023-06-18 DIAGNOSIS — N201 Calculus of ureter: Secondary | ICD-10-CM | POA: Insufficient documentation

## 2023-06-18 DIAGNOSIS — N200 Calculus of kidney: Secondary | ICD-10-CM | POA: Diagnosis not present

## 2023-06-18 DIAGNOSIS — I878 Other specified disorders of veins: Secondary | ICD-10-CM | POA: Diagnosis not present

## 2023-06-19 ENCOUNTER — Ambulatory Visit: Payer: BC Managed Care – PPO | Admitting: Urology

## 2023-06-19 ENCOUNTER — Encounter: Payer: Self-pay | Admitting: Urology

## 2023-06-19 VITALS — BP 100/66 | HR 109

## 2023-06-19 DIAGNOSIS — R3129 Other microscopic hematuria: Secondary | ICD-10-CM

## 2023-06-19 DIAGNOSIS — N200 Calculus of kidney: Secondary | ICD-10-CM

## 2023-06-19 LAB — MICROSCOPIC EXAMINATION

## 2023-06-19 LAB — URINALYSIS, ROUTINE W REFLEX MICROSCOPIC
Bilirubin, UA: NEGATIVE
Glucose, UA: NEGATIVE
Ketones, UA: NEGATIVE
Leukocytes,UA: NEGATIVE
Nitrite, UA: NEGATIVE
Specific Gravity, UA: 1.03 (ref 1.005–1.030)
Urobilinogen, Ur: 1 mg/dL (ref 0.2–1.0)
pH, UA: 6 (ref 5.0–7.5)

## 2023-06-19 NOTE — Progress Notes (Signed)
 Name: Sonya Kline DOB: [birth year] MRN: [id number]  History of Present Illness: [name] is a 53 y.o. female who presents today for follow up visit at [facility] [place]. GU History includes: 1. Kidney stones.  Recent history:  > 11/28/2022: Underwent right ESWL procedure by Dr. Claretta Croft for management of a right distal ureteral stone.   > 12/07/2022: KUB showed a 6 mm intrarenal left kidney stone. Multiple pelvic phleboliths.   > 12/20/2022:  - Postop visit. Asymptomatic.  - Reviewed KUB images with Dr. Claretta Croft after visit. Difficult to interpret due to multiple phleboliths in the surrounding tissue. Per Dr. Claretta Croft the 3 mm distal right ureteral stone still present (appeared round on the 11/28/2022 KUB and jagged on 12/07/2022 KUB ("smudged")).  - Dr. Claretta Croft advised continued medical expulsive therapy with Flomax  0.4 mg daily for another 2 weeks then follow up with repeat KUB.   > 01/08/2023: Patient canceled 2 week follow up visit.  > 06/18/2023 (yesterday): KUB performed. Awaiting radiology read; stone burden appears stable compared to prior KUB on 12/07/2022 per urology provider interpretation today.  Today: She denies recent stone passage. She denies flank pain or abdominal pain. She denies fevers, nausea, or vomiting. She denies increased urinary urgency, frequency, nocturia, dysuria, gross hematuria, hesitancy, straining to void, or sensations of incomplete emptying.  Medications: Current Outpatient Medications  Medication Sig Dispense Refill   amLODipine  (NORVASC ) 10 MG tablet Take 10 mg by mouth daily.      Dulaglutide  (TRULICITY ) 4.5 MG/0.5ML SOAJ Inject 4.5 mg as directed once a week. 6 mL 3   rosuvastatin  (CRESTOR ) 20 MG tablet Take 20 mg by mouth daily.     No current facility-administered medications for this visit.    Allergies: Allergies  Allergen Reactions   Contrast Media [Iodinated Contrast Media] Hives   Prednisone  Rash    Past  Medical History:  Diagnosis Date   Diabetes mellitus, type II (HCC)    Diverticulosis    HTN (hypertension)    Kidney stone    Past Surgical History:  Procedure Laterality Date   ABDOMINAL HYSTERECTOMY     BALLOON DILATION N/A 06/07/2021   Procedure: BALLOON DILATION;  Surgeon: Vinetta Greening, DO;  Location: [facility];  Service: Endoscopy;  Laterality: N/A;   CESAREAN SECTION     x 2   COLONOSCOPY N/A 08/22/2019   Procedure: COLONOSCOPY;  Surgeon: Suzette Espy, MD;  Location: [facility];  Service: Endoscopy;  Laterality: N/A;  9:30   ESOPHAGOGASTRODUODENOSCOPY (EGD) WITH PROPOFOL  N/A 06/07/2021   Procedure: ESOPHAGOGASTRODUODENOSCOPY (EGD) WITH PROPOFOL ;  Surgeon: Vinetta Greening, DO;  Location: [facility];  Service: Endoscopy;  Laterality: N/A;  3:00pm   EXTRACORPOREAL SHOCK WAVE LITHOTRIPSY Right 02/15/2021   Procedure: EXTRACORPOREAL SHOCK WAVE LITHOTRIPSY (ESWL);  Surgeon: Mellie Sprinkle., MD;  Location: [facility];  Service: Urology;  Laterality: Right;   EXTRACORPOREAL SHOCK WAVE LITHOTRIPSY Right 11/28/2022   Procedure: EXTRACORPOREAL SHOCK WAVE LITHOTRIPSY (ESWL);  Surgeon: Marco Severs, MD;  Location: [facility];  Service: Urology;  Laterality: Right;   POLYPECTOMY  08/22/2019   Procedure: POLYPECTOMY;  Surgeon: Suzette Espy, MD;  Location: [facility];  Service: Endoscopy;;   Family History  Problem Relation Age of Onset   Diabetes Mother    Hypertension Mother    Hypertension Father    Social History   Socioeconomic History   Marital status: Married    Spouse name: Not on file   Number of  children: Not on file   Years of education: college   Highest education level: Not on file  Occupational History   Not on file  Tobacco Use   Smoking status: Never   Smokeless tobacco: Never  Vaping Use   Vaping status: Never Used  Substance and Sexual Activity   Alcohol use: No   Drug use: No   Sexual activity: Yes    Birth control/protection: None,  Surgical  Other Topics Concern   Not on file  Social History Narrative   Not on file   Social Drivers of Health   Financial Resource Strain: Not on file  Food Insecurity: Not on file  Transportation Needs: Not on file  Physical Activity: Sufficiently Active (02/25/2018)   Received from [facility], [facility]   Exercise Vital Sign    Days of Exercise per Week: 3 days    Minutes of Exercise per Session: 60 min  Stress: Not on file  Social Connections: Not on file  Intimate Partner Violence: Not At Risk (02/25/2018)   Received from [facility], [facility]   Humiliation, Afraid, Rape, and Kick questionnaire    Fear of Current or Ex-Partner: No    Emotionally Abused: No    Physically Abused: No    Sexually Abused: No    SUBJECTIVE  Review of Systems Constitutional: Patient denies any unintentional weight loss or change in strength lntegumentary: Patient denies any rashes or pruritus Cardiovascular: Patient denies chest pain or syncope Respiratory: Patient denies shortness of breath Musculoskeletal: Patient denies muscle cramps or weakness Neurologic: Patient denies convulsions or seizures Allergic/Immunologic: Patient denies recent allergic reaction(s) Hematologic/Lymphatic: Patient denies bleeding tendencies Endocrine: Patient denies heat/cold intolerance  GU: As per HPI.  OBJECTIVE Vitals:   06/19/23 1338  BP: 100/66  Pulse: (!) 109   There is no height or weight on file to calculate BMI.  Physical Examination Constitutional: No obvious distress; patient is non-toxic appearing  Cardiovascular: No visible lower extremity edema.  Respiratory: The patient does not have audible wheezing/stridor; respirations do not appear labored  Gastrointestinal: Abdomen non-distended Musculoskeletal: Normal ROM of UEs  Skin: No obvious rashes/open sores  Neurologic: CN 2-12 grossly intact Psychiatric: Answered questions appropriately with normal affect   Hematologic/Lymphatic/Immunologic: No obvious bruises or sites of spontaneous bleeding  Urine microscopy: 0-5 WBC/hpf, 11-30 RBC/hpf, few bacteria  ASSESSMENT Kidney stones - Plan: Urinalysis, Routine w reflex microscopic, DG Abd 1 View, CT RENAL STONE STUDY  Microscopic hematuria - Plan: CT RENAL STONE STUDY  We reviewed recent imaging results; awaiting radiology results, appears to have no acute findings per provider interpretation.  We discussed evidence of asymptomatic microscopic hematuria per today's UA. Possible etiologies include but are not limited to: sexual activity, stone, trauma, blood thinner use, urinary tract infection, urethral irritation secondary to vaginal atrophy, chronic kidney disease, glomerulonephropathy, malignancy. Advised CT stone for further evaluation to differentiate possible lower urinary tract stones versus phleboliths.   For stone prevention: Advised adequate hydration and we discussed option to consider low oxalate diet given that calcium  oxalate is the most common type of stone. Handout provided about stone prevention diet.  Will plan to follow up in 6 months with KUB for stone surveillance or sooner if needed. Patient verbalized understanding of and agreement with current plan. All questions were answered.  PLAN Advised the following: CT stone within 1-2 weeks. Maintain adequate fluid intake daily. Drink citrus juice (lemon, lime or orange juice) routinely. Low oxalate diet. Return in  about 6 months (around 12/20/2023) for KUB, UA, & f/u with Griselda Lederer NP.  Orders Placed This Encounter  Procedures   Microscopic Examination   DG Abd 1 View    Standing Status:   Future    Expected Date:   12/20/2023    Expiration Date:   06/18/2024    Reason for Exam (SYMPTOM  OR DIAGNOSIS REQUIRED):   kidney stone    Is patient pregnant?:   Unknown (Please Explain)    Preferred imaging location?:   [facility]   CT RENAL STONE STUDY    Standing Status:    Future    Expiration Date:   06/18/2024    Is patient pregnant?:   Unknown (Please Explain)    Preferred imaging location?:   [facility]   Urinalysis, Routine w reflex microscopic    It has been explained that the patient is to follow regularly with their PCP in addition to all other providers involved in their care and to follow instructions provided by these respective offices. Patient advised to contact urology clinic if any urologic-pertaining questions, concerns, new symptoms or problems arise in the interim period.  There are no Patient Instructions on file for this visit.  Electronically signed by:  Lauretta Ponto, MSN, FNP-C, CUNP 06/19/2023 3:45 PM

## 2023-06-20 ENCOUNTER — Ambulatory Visit (HOSPITAL_COMMUNITY)
Admission: RE | Admit: 2023-06-20 | Discharge: 2023-06-20 | Disposition: A | Discharge status: Home / Self Care | Source: Ambulatory Visit | Attending: Urology | Admitting: Urology

## 2023-06-20 DIAGNOSIS — R3129 Other microscopic hematuria: Secondary | ICD-10-CM | POA: Insufficient documentation

## 2023-06-20 DIAGNOSIS — N200 Calculus of kidney: Secondary | ICD-10-CM | POA: Diagnosis not present

## 2023-06-28 ENCOUNTER — Ambulatory Visit: Payer: Self-pay | Admitting: Urology

## 2023-06-29 DIAGNOSIS — E785 Hyperlipidemia, unspecified: Secondary | ICD-10-CM | POA: Diagnosis not present

## 2023-06-29 DIAGNOSIS — E118 Type 2 diabetes mellitus with unspecified complications: Secondary | ICD-10-CM | POA: Diagnosis not present

## 2023-06-29 DIAGNOSIS — I10 Essential (primary) hypertension: Secondary | ICD-10-CM | POA: Diagnosis not present

## 2023-06-29 DIAGNOSIS — Z79899 Other long term (current) drug therapy: Secondary | ICD-10-CM | POA: Diagnosis not present

## 2023-07-06 DIAGNOSIS — E785 Hyperlipidemia, unspecified: Secondary | ICD-10-CM | POA: Diagnosis not present

## 2023-07-06 DIAGNOSIS — I10 Essential (primary) hypertension: Secondary | ICD-10-CM | POA: Diagnosis not present

## 2023-07-19 ENCOUNTER — Other Ambulatory Visit (HOSPITAL_COMMUNITY): Payer: Self-pay | Admitting: Internal Medicine

## 2023-07-19 DIAGNOSIS — Z1231 Encounter for screening mammogram for malignant neoplasm of breast: Secondary | ICD-10-CM

## 2023-07-19 DIAGNOSIS — N83209 Unspecified ovarian cyst, unspecified side: Secondary | ICD-10-CM | POA: Diagnosis not present

## 2023-08-03 ENCOUNTER — Ambulatory Visit (HOSPITAL_COMMUNITY)

## 2023-08-09 ENCOUNTER — Ambulatory Visit (HOSPITAL_COMMUNITY)
Admission: RE | Admit: 2023-08-09 | Discharge: 2023-08-09 | Disposition: A | Discharge status: Home / Self Care | Source: Ambulatory Visit | Attending: Internal Medicine | Admitting: Internal Medicine

## 2023-08-09 ENCOUNTER — Encounter (HOSPITAL_COMMUNITY): Payer: Self-pay

## 2023-08-09 DIAGNOSIS — Z1231 Encounter for screening mammogram for malignant neoplasm of breast: Secondary | ICD-10-CM | POA: Insufficient documentation

## 2023-08-20 ENCOUNTER — Encounter: Payer: Self-pay | Admitting: Orthopedic Surgery

## 2023-08-20 ENCOUNTER — Ambulatory Visit: Admitting: Orthopedic Surgery

## 2023-08-20 ENCOUNTER — Other Ambulatory Visit (INDEPENDENT_AMBULATORY_CARE_PROVIDER_SITE_OTHER): Payer: Self-pay

## 2023-08-20 VITALS — BP 116/86 | HR 96 | Ht 62.0 in | Wt 209.0 lb

## 2023-08-20 DIAGNOSIS — M79642 Pain in left hand: Secondary | ICD-10-CM

## 2023-08-20 DIAGNOSIS — M659 Unspecified synovitis and tenosynovitis, unspecified site: Secondary | ICD-10-CM

## 2023-08-20 MED ORDER — DICLOFENAC POTASSIUM 50 MG PO TABS: 50.0000 mg | ORAL_TABLET | Freq: Two times a day (BID) | ORAL | 3 refills | Status: DC

## 2023-08-20 NOTE — Progress Notes (Signed)
   Subjective:    Patient ID: Sonya Kline, female    DOB: [birth year], 53 y.o.   MRN: [id number]  53 year old female with diabetes 3-week history of pain left metacarpophalangeal joint of index finger.  No trauma.  No numbness no tingling no stiffness no popping no catching no locking      Review of Systems no history of fever     Objective:   Physical Exam  Normal development grooming and hygiene  Awake alert and oriented x 3  Tenderness of the metacarpophalangeal joint of the left index finger  Neurovascular exam normal  A1 pulley area nontender      Assessment & Plan:   DG Hand Complete Left Result Date: 08/20/2023 Acute onset of pain left index finger MTP joint No fracture dislocation or bony erosions Appearance without soft tissue swelling Normal appearance left hand including index finger MCP joint     Acute synovitis MCP joint left index finger  Recommend NSAIDs  Meds ordered this encounter  Medications   diclofenac  (CATAFLAM ) 50 MG tablet    Sig: Take 1 tablet (50 mg total) by mouth 2 (two) times daily.    Dispense:  60 tablet    Refill:  3

## 2023-08-20 NOTE — Progress Notes (Signed)
  Intake history:  LMP 11/14/2011  There is no height or weight on file to calculate BMI.    WHAT ARE WE SEEING YOU FOR TODAY?   left hand(s)  How long has this bothered you? (DOI?DOS?WS?)  3 weeks  Anticoag.  No  Diabetes Yes  Heart disease No  Hypertension Yes  SMOKING HX No  Kidney disease No  Any ALLERGIES ______________ Allergies  Allergen Reactions   Contrast Media [Iodinated Contrast Media] Hives   Prednisone  Rash   ________________________________   Treatment:  Have you taken:  Tylenol  No  Advil  No  Had PT No  Had injection No  Other  _________________________

## 2023-09-14 ENCOUNTER — Other Ambulatory Visit (HOSPITAL_COMMUNITY): Payer: Self-pay

## 2023-10-07 ENCOUNTER — Other Ambulatory Visit: Payer: Self-pay | Admitting: Nurse Practitioner

## 2023-10-07 DIAGNOSIS — E1165 Type 2 diabetes mellitus with hyperglycemia: Secondary | ICD-10-CM

## 2023-10-08 ENCOUNTER — Ambulatory Visit: Payer: BC Managed Care – PPO | Admitting: Nurse Practitioner

## 2023-10-16 ENCOUNTER — Ambulatory Visit: Admitting: Nurse Practitioner

## 2023-10-16 DIAGNOSIS — Z7985 Long-term (current) use of injectable non-insulin antidiabetic drugs: Secondary | ICD-10-CM

## 2023-10-16 DIAGNOSIS — E1165 Type 2 diabetes mellitus with hyperglycemia: Secondary | ICD-10-CM

## 2023-10-16 DIAGNOSIS — E559 Vitamin D deficiency, unspecified: Secondary | ICD-10-CM

## 2023-10-16 DIAGNOSIS — I10 Essential (primary) hypertension: Secondary | ICD-10-CM

## 2023-11-22 ENCOUNTER — Encounter: Payer: Self-pay | Admitting: *Deleted

## 2023-11-22 NOTE — Progress Notes (Signed)
 Sonya Kline                                          MRN: [id number]   11/22/2023   The [facility] Quality Team Specialist reviewed this patient medical record for the purposes of chart review for care gap closure. The following were reviewed: chart review for care gap closure-kidney health evaluation for diabetes:eGFR  and uACR.    [facility] Quality Team

## 2023-11-27 ENCOUNTER — Telehealth: Payer: Self-pay | Admitting: Nurse Practitioner

## 2023-11-27 ENCOUNTER — Telehealth: Payer: Self-pay

## 2023-11-27 ENCOUNTER — Other Ambulatory Visit (HOSPITAL_COMMUNITY): Payer: Self-pay

## 2023-11-27 NOTE — Telephone Encounter (Signed)
 Pt states she needs a PA for her Trulicity 

## 2023-11-27 NOTE — Telephone Encounter (Signed)
 Pharmacy Patient Advocate Encounter   Received notification from Pt Calls Messages that prior authorization for Trulicity  4.5mg /0.15ml is required/requested.   Insurance verification completed.   The patient is insured through [facility].   Per test claim: The current 30 day co-pay is, $30.  No PA needed at this time. This test claim was processed through [facility]- copay amounts may vary at other pharmacies due to pharmacy/plan contracts, or as the patient moves through the different stages of their insurance plan.

## 2023-11-28 NOTE — Telephone Encounter (Signed)
 Addressed in new encounter, called pt and lvm to let her know what was said from [place] team

## 2023-12-20 ENCOUNTER — Ambulatory Visit: Admitting: Urology

## 2023-12-31 ENCOUNTER — Telehealth: Payer: Self-pay | Admitting: Nurse Practitioner

## 2023-12-31 DIAGNOSIS — E1165 Type 2 diabetes mellitus with hyperglycemia: Secondary | ICD-10-CM

## 2023-12-31 NOTE — Telephone Encounter (Signed)
 Pt has switched pharmacies. She said she gave them the right insurance card but they ran the old card. She is stating they need a PA on her Trulicity . She is going to bring the new card to office tomorrow

## 2024-01-02 ENCOUNTER — Other Ambulatory Visit (HOSPITAL_COMMUNITY): Payer: Self-pay

## 2024-01-02 NOTE — Telephone Encounter (Signed)
 See response from PA team.  Looks like it is available with out a PA if still on same insurance.

## 2024-01-02 NOTE — Telephone Encounter (Signed)
   This is what I am seeing as her active insurance. This is also the insurance info the pharmacy put on the PA request they sent. Test claim shows that previous PA is still in effect until 04/2024 and the 30 day copay is $35. If this insurance info is not up to date, please have the pt upload a picture of her most recent insurance card and I can run another test claim.

## 2024-01-09 ENCOUNTER — Other Ambulatory Visit (HOSPITAL_COMMUNITY): Payer: Self-pay

## 2024-01-09 NOTE — Telephone Encounter (Signed)
 Pts insurance is updated and new card is in

## 2024-01-09 NOTE — Telephone Encounter (Signed)
 Okay I will let pt know.  Thank you.

## 2024-01-14 ENCOUNTER — Telehealth: Payer: Self-pay | Admitting: Nurse Practitioner

## 2024-01-14 NOTE — Telephone Encounter (Signed)
 Pt has left a message about PA for her insulin .  Pt was sent what PA team is showing.  Not sure what else she is wanting us  to do with it.  Left a number for her insurance company of [phone]

## 2024-01-15 ENCOUNTER — Other Ambulatory Visit (HOSPITAL_COMMUNITY): Payer: Self-pay

## 2024-01-15 NOTE — Telephone Encounter (Signed)
 Patient has been called and a message left asking her to confirm the medication that she is needing to be refilled.

## 2024-01-15 NOTE — Telephone Encounter (Signed)
 I believe I have found the issue. Script is written for 90 day supply (84 technically)  Insurance will only cover 30 days per fill. She will need to pick up one month at a time instead of 3.

## 2024-01-15 NOTE — Telephone Encounter (Signed)
 Patient has been calling office. She needs PA completed, she has supplied a number for her insurance.

## 2024-01-15 NOTE — Telephone Encounter (Signed)
 Truclicity

## 2024-01-16 MED ORDER — TRULICITY 4.5 MG/0.5ML ~~LOC~~ SOAJ: 4.5000 mg | SUBCUTANEOUS | 6 refills | Status: DC

## 2024-01-16 NOTE — Telephone Encounter (Signed)
 My chart message sent

## 2024-01-16 NOTE — Addendum Note (Signed)
 Addended by: Watson Robarge J on: 01/16/2024 08:23 AM   Modules accepted: Orders

## 2024-01-16 NOTE — Telephone Encounter (Signed)
 I have resent the script for 30 day supply to the pharmacy on file.  Her insurance does not like 90 day supplies therefore she will have to pick it up monthly.  Will you call her and let her know?

## 2024-01-16 NOTE — Telephone Encounter (Signed)
 Hi Now we are getting another PA request for the 30 day? Any way to expedite this?

## 2024-01-16 NOTE — Telephone Encounter (Signed)
 This has been addressed and the patient was left a voicemail. Sharing that her insurance covers a 30 day supply instead of a 90 day supply. This is per the RX PA Team and [facility] has sent in the 30 day prescription.

## 2024-01-21 ENCOUNTER — Encounter: Payer: Self-pay | Admitting: Nurse Practitioner

## 2024-01-21 ENCOUNTER — Ambulatory Visit: Admitting: Nurse Practitioner

## 2024-01-21 VITALS — BP 100/66 | HR 82 | Ht 62.0 in | Wt 202.2 lb

## 2024-01-21 DIAGNOSIS — E559 Vitamin D deficiency, unspecified: Secondary | ICD-10-CM

## 2024-01-21 DIAGNOSIS — E1165 Type 2 diabetes mellitus with hyperglycemia: Secondary | ICD-10-CM | POA: Diagnosis not present

## 2024-01-21 DIAGNOSIS — Z7985 Long-term (current) use of injectable non-insulin antidiabetic drugs: Secondary | ICD-10-CM

## 2024-01-21 DIAGNOSIS — I10 Essential (primary) hypertension: Secondary | ICD-10-CM

## 2024-01-21 LAB — POCT GLYCOSYLATED HEMOGLOBIN (HGB A1C): Hemoglobin A1C: 6.7 % — AB (ref 4.0–5.6)

## 2024-01-21 MED ORDER — TIRZEPATIDE 7.5 MG/0.5ML ~~LOC~~ SOAJ: 7.5000 mg | SUBCUTANEOUS | 3 refills | Status: AC

## 2024-01-21 NOTE — Progress Notes (Signed)
 01/21/2024, 10:29 AM  Endocrinology follow-up note   Subjective:    Patient ID: Sonya Kline, female    DOB: 27-Aug-1970.  Sonya Kline is being seen in follow-up after she was seen in consultation for management of currently uncontrolled symptomatic diabetes requested by  Sheryle Carwin, MD.   Past Medical History:  Diagnosis Date   Diabetes mellitus, type II (HCC)    Diverticulosis    HTN (hypertension)    Kidney stone     Past Surgical History:  Procedure Laterality Date   ABDOMINAL HYSTERECTOMY     BALLOON DILATION N/A 06/07/2021   Procedure: BALLOON DILATION;  Surgeon: Cindie Carlin POUR, DO;  [facility]: [facility];  Service: Endoscopy;  Laterality: N/A;   CESAREAN SECTION     x 2   COLONOSCOPY N/A 08/22/2019   Procedure: COLONOSCOPY;  Surgeon: Shaaron Lamar HERO, MD;  Location: [facility];  Service: Endoscopy;  Laterality: N/A;  9:30   ESOPHAGOGASTRODUODENOSCOPY (EGD) WITH PROPOFOL  N/A 06/07/2021   Procedure: ESOPHAGOGASTRODUODENOSCOPY (EGD) WITH PROPOFOL ;  Surgeon: Cindie Carlin POUR, DO;  Location: [facility];  Service: Endoscopy;  Laterality: N/A;  3:00pm   EXTRACORPOREAL SHOCK WAVE LITHOTRIPSY Right 02/15/2021   Procedure: EXTRACORPOREAL SHOCK WAVE LITHOTRIPSY (ESWL);  Surgeon: Roseann Adine PARAS., MD;  Location: [facility];  Service: Urology;  Laterality: Right;   EXTRACORPOREAL SHOCK WAVE LITHOTRIPSY Right 11/28/2022   Procedure: EXTRACORPOREAL SHOCK WAVE LITHOTRIPSY (ESWL);  Surgeon: Sherrilee Belvie CROME, MD;  Location: [facility];  Service: Urology;  Laterality: Right;   POLYPECTOMY  08/22/2019   Procedure: POLYPECTOMY;  Surgeon: Shaaron Lamar HERO, MD;  Location: [facility];  Service: Endoscopy;;    Social History   Socioeconomic History   Marital status: Married    Spouse name: Not on file   Number of children: Not on file   Years of education: college   Highest education level: Not on file   Occupational History   Not on file  Tobacco Use   Smoking status: Never   Smokeless tobacco: Never  Vaping Use   Vaping status: Never Used  Substance and Sexual Activity   Alcohol use: No   Drug use: No   Sexual activity: Yes    Birth control/protection: None, Surgical  Other Topics Concern   Not on file  Social History Narrative   Not on file   Social Drivers of Health   Financial Resource Strain: Not on file  Food Insecurity: No Food Insecurity (07/19/2023)   [facility]   Hunger Vital Sign    Within the past 12 months, you worried that your food would run out before you got the money to buy more.: Never true    Within the past 12 months, the food you bought just didn't last and you didn't have money to get more.: Never true  Transportation Needs: No Transportation Needs (07/19/2023)   Received from [facility] - Transportation    Lack of Transportation (Medical): No    Lack of Transportation (Non-Medical): No  Physical Activity: Sufficiently Active (07/19/2023)   Received from [facility]  Exercise Vital Sign    On average, how many days per week do you engage in moderate to strenuous exercise (like a brisk walk)?: 7 days    On average, how many minutes do you engage in exercise at this level?: 70 min  Stress: No Stress Concern Present (07/19/2023)   Received from [facility] - Occupational Stress Questionnaire    Feeling of Stress : Not at all  Social Connections: Not on file    Family History  Problem Relation Age of Onset   Diabetes Mother    Hypertension Mother    Hypertension Father     Outpatient Encounter Medications as of 01/21/2024  Medication Sig   amLODipine  (NORVASC ) 10 MG tablet Take 10 mg by mouth daily.    rosuvastatin  (CRESTOR ) 20 MG tablet Take 20 mg by mouth daily.   tirzepatide  (MOUNJARO ) 7.5 MG/0.5ML Pen Inject 7.5 mg into the skin once a week.   [DISCONTINUED]  Dulaglutide  (TRULICITY ) 4.5 MG/0.5ML SOAJ Inject 4.5 mg into the skin once a week.   [DISCONTINUED] diclofenac  (CATAFLAM ) 50 MG tablet Take 1 tablet (50 mg total) by mouth 2 (two) times daily.   No facility-administered encounter medications on file as of 01/21/2024.    ALLERGIES: Allergies  Allergen Reactions   Contrast Media [Iodinated Contrast Media] Hives   Prednisone  Rash    VACCINATION STATUS: Immunization History  Administered Date(s) Administered   Rabies, IM 03/17/2013, 03/20/2013, 03/27/2013, 04/11/2013   Tdap 03/17/2013    Diabetes She presents for her follow-up diabetic visit. She has type 2 diabetes mellitus. Onset time: She was diagnosed at approximate age of 53 years. Her disease course has been stable. There are no hypoglycemic associated symptoms. Pertinent negatives for hypoglycemia include no confusion, headaches, pallor or seizures. Pertinent negatives for diabetes include no chest pain, no fatigue, no polydipsia, no polyphagia, no polyuria and no weight loss. There are no hypoglycemic complications. Symptoms are stable. There are no diabetic complications. Risk factors for coronary artery disease include diabetes mellitus, hypertension, obesity and dyslipidemia. Current diabetic treatments: Trulicity . She is compliant with treatment most of the time (has been without Trulicity  for 3 weeks due to pharmacy issues). Her weight is fluctuating minimally. She is following a generally healthy diet. When asked about meal planning, she reported none. She has not had a previous visit with a dietitian. She rarely participates in exercise. (She presents today with no meter or logs to review (was not asked to routinely monitor glucose given safe medication regimen).  Her POCT A1c today is 6.7%, essentially unchanged from previous visit of 6.6%.  She has been without her Trulicity  for 3 weeks due to insurance/pharmacy issues.) An ACE inhibitor/angiotensin II receptor blocker is not being  taken. She does not see a podiatrist.Eye exam is current.  Hypertension This is a chronic problem. The current episode started more than 1 year ago. The problem has been resolved since onset. The problem is controlled. Pertinent negatives include no chest pain, headaches, palpitations or shortness of breath. There are no associated agents to hypertension. Risk factors for coronary artery disease include diabetes mellitus, family history, obesity, sedentary lifestyle and dyslipidemia. Past treatments include calcium  channel blockers. The current treatment provides mild improvement. Compliance problems include diet and exercise.   Hyperlipidemia This is a chronic problem. The current episode started more than 1 year ago. The problem is uncontrolled. Recent lipid tests were reviewed and are high. Exacerbating diseases include diabetes. There are no  known factors aggravating her hyperlipidemia. Pertinent negatives include no chest pain or shortness of breath. Current antihyperlipidemic treatment includes statins. The current treatment provides mild improvement of lipids. Compliance problems include adherence to diet and adherence to exercise.  Risk factors for coronary artery disease include diabetes mellitus, dyslipidemia, hypertension, obesity and a sedentary lifestyle.    Review of systems  Constitutional: + stable body weight,  current Body mass index is 36.98 kg/m. , no fatigue, no subjective hyperthermia, no subjective hypothermia Eyes: no blurry vision, no xerophthalmia ENT: no sore throat, no nodules palpated in throat, no dysphagia/odynophagia, no hoarseness Cardiovascular: no chest pain, no shortness of breath, no palpitations, no leg swelling Respiratory: no cough, no shortness of breath Gastrointestinal: no nausea/vomiting/diarrhea Musculoskeletal: no muscle/joint aches Skin: no rashes, no hyperemia Neurological: no tremors, no numbness, no tingling, no dizziness Psychiatric: no  depression, no anxiety   Objective:    BP 100/66 (BP Location: Left Arm, Patient Position: Sitting)   Pulse 82   Ht 5' 2 (1.575 m)   Wt 202 lb 3.2 oz (91.7 kg)   LMP 11/14/2011   BMI 36.98 kg/m    Wt Readings from Last 3 Encounters:  01/21/24 202 lb 3.2 oz (91.7 kg)  08/20/23 209 lb (94.8 kg)  04/09/23 209 lb (94.8 kg)    BP Readings from Last 3 Encounters:  01/21/24 100/66  08/20/23 116/86  06/19/23 100/66     Physical Exam- Limited  Constitutional:  Body mass index is 36.98 kg/m. , not in acute distress, normal state of mind Eyes:  EOMI, no exophthalmos Musculoskeletal: no gross deformities, strength intact in all four extremities, no gross restriction of joint movements Skin:  no rashes, no hyperemia Neurological: no tremor with outstretched hands   Diabetic Foot Exam - Simple   Simple Foot Form Diabetic Foot exam was performed with the following findings: Yes 01/21/2024 10:12 AM  Visual Inspection No deformities, no ulcerations, no other skin breakdown bilaterally: Yes Sensation Testing Intact to touch and monofilament testing bilaterally: Yes Pulse Check Posterior Tibialis and Dorsalis pulse intact bilaterally: Yes Comments     CMP ( most recent) CMP     Component Value Date/Time   NA 143 03/28/2023 0817   K 4.6 03/28/2023 0817   CL 100 03/28/2023 0817   CO2 28 03/28/2023 0817   GLUCOSE 111 (H) 03/28/2023 0817   GLUCOSE 109 (H) 09/24/2022 0831   BUN 16 03/28/2023 0817   CREATININE 0.80 03/28/2023 0817   CALCIUM  9.6 03/28/2023 0817   PROT 7.5 03/28/2023 0817   ALBUMIN 3.9 03/28/2023 0817   AST 18 03/28/2023 0817   ALT 12 03/28/2023 0817   ALKPHOS 93 03/28/2023 0817   BILITOT 0.3 03/28/2023 0817   GFRNONAA >60 09/24/2022 0831   GFRAA 102 01/21/2020 0737     Diabetic Labs (most recent): Lab Results  Component Value Date   HGBA1C 6.7 (A) 01/21/2024   HGBA1C 6.6 (A) 04/09/2023   HGBA1C 6.1 (A) 10/02/2022   MICROALBUR 30 01/29/2020         Assessment & Plan:   1) Type 2 diabetes mellitus with hyperglycemia (HCC)  - Sonya Kline has currently uncontrolled symptomatic type 2 DM since 53 years of age.  Recent labs reviewed.   She presents today with no meter or logs to review (was not asked to routinely monitor glucose given safe medication regimen).  Her POCT A1c today is 6.7%, essentially unchanged from previous visit of 6.6%.  She has been without her  Trulicity  for 3 weeks due to insurance/pharmacy issues.  - I had a long discussion with her about the progressive nature of diabetes and the pathology behind its complications. -She does not report gross complications of diabetes, however she remains at a high risk for more acute and chronic complications which include CAD, CVA, CKD, retinopathy, and neuropathy. These are all discussed in detail with her.  - Nutritional counseling repeated/built upon at each appointment.  - The patient admits there is a room for improvement in their diet and drink choices. -  Suggestion is made for the patient to avoid simple carbohydrates from their diet including Cakes, Sweet Desserts / Pastries, Ice Cream, Soda (diet and regular), Sweet Tea, Candies, Chips, Cookies, Sweet Pastries, Store Bought Juices, Alcohol in Excess of 1-2 drinks a day, Artificial Sweeteners, Coffee Creamer, and Sugar-free Products. This will help patient to have stable blood glucose profile and potentially avoid unintended weight gain.   - I encouraged the patient to switch to unprocessed or minimally processed complex starch and increased protein intake (animal or plant source), fruits, and vegetables.   - Patient is advised to stick to a routine mealtimes to eat 3 meals a day and avoid unnecessary snacks (to snack only to correct hypoglycemia).  - I have approached her with the following individualized plan to manage  her diabetes and patient agrees:   -She is advised to continue Trulicity  4.5 mg SQ  weekly until she depletes her current supply, will change over to Mounjaro  7.5 mg SQ weekly to get better control over glucose.  - she can take a break from routine glucose monitoring for now.  - Specific targets for  A1c;  LDL, HDL,  and Triglycerides were discussed with the patient.  2) Blood Pressure /Hypertension:  Her blood pressure is controlled to target.  She is advised to continue Norvasc  10 mg po daily.    3) Lipids/Hyperlipidemia:  Her most recent lipid panel from 03/28/23 shows uncontrolled LDL at 168 worsening.   She is advised to avoid fried foods and red meats.  Will hold off on initiating statin, had myalgias with it previously.  Will recheck lipid panel prior to next visit.  4)  Weight/Diet:  Her Body mass index is 36.98 kg/m.  -   clearly complicating her diabetes care.   she is a candidate for weight loss. I discussed with her the fact that loss of 5 - 10% of her  current body weight will have the most impact on her diabetes management.  Exercise, and detailed carbohydrates information provided  -  detailed on discharge instructions.  5) Vitamin D  Deficiency: Her recent vitamin D  level from 11/24/21 was low at 26.7.  She is not currently on any supplementation.  She is advised to restart OTC Vitamin D3 5000 units daily.  Will recheck vitamin D  prior to next visit.  6) Chronic Care/Health Maintenance: -she is not on ACEI/ARB is not on Statin medications and is encouraged to initiate and continue to follow up with Ophthalmology, Dentist,  Podiatrist at least yearly or according to recommendations, and advised to stay away from smoking. I have recommended yearly flu vaccine and pneumonia vaccine at least every 5 years; moderate intensity exercise for up to 150 minutes weekly; and  sleep for at least 7 hours a day.  - she is advised to maintain close follow up with Sheryle Carwin, MD for primary care needs, as well as her other providers for optimal and coordinated care.  I  spent  30  minutes in the care of the patient today including review of labs from CMP, Lipids, Thyroid  Function, Hematology (current and previous including abstractions from other facilities); face-to-face time discussing  her blood glucose readings/logs, discussing hypoglycemia and hyperglycemia episodes and symptoms, medications doses, her options of short and long term treatment based on the latest standards of care / guidelines;  discussion about incorporating lifestyle medicine;  and documenting the encounter. Risk reduction counseling performed per USPSTF guidelines to reduce obesity and cardiovascular risk factors.     Please refer to Patient Instructions for Blood Glucose Monitoring and Insulin /Medications Dosing Guide  in media tab for additional information. Please  also refer to  Patient Self Inventory in the Media  tab for reviewed elements of pertinent patient history.  Sonya Kline participated in the discussions, expressed understanding, and voiced agreement with the above plans.  All questions were answered to her satisfaction. she is encouraged to contact clinic should she have any questions or concerns prior to her return visit.    Follow up plan: - Return in about 6 months (around 07/21/2024) for Diabetes F/U with A1c in office, Previsit labs, Bring meter and logs.  Sonya Kline, [facility] [address] [place], [place] [address] Phone: [phone] Fax: [phone]  01/21/2024, 10:29 AM

## 2024-01-23 ENCOUNTER — Telehealth: Payer: Self-pay

## 2024-01-23 ENCOUNTER — Other Ambulatory Visit (HOSPITAL_COMMUNITY): Payer: Self-pay

## 2024-01-23 NOTE — Telephone Encounter (Signed)
 Noted

## 2024-01-23 NOTE — Telephone Encounter (Signed)
 Pharmacy Patient Advocate Encounter   Received notification from CoverMyMeds that prior authorization for Mounjaro  7.5MG /0.5ML auto-injectors is required/requested.   Insurance verification completed.   The patient is insured through [facility].   Per test claim: PA required; PA submitted to above mentioned insurance via Latent Key/confirmation #/EOC [id number] Status is pending

## 2024-01-31 NOTE — Telephone Encounter (Signed)
 Pharmacy Patient Advocate Encounter  Received notification from [facility] that Prior Authorization for Mounjaro  7.5MG /0.5ML auto-injectors  has been APPROVED from 01-23-2024 to 01-22-2027   PA #/Case ID/Reference #: [id number]

## 2024-01-31 NOTE — Telephone Encounter (Signed)
 Patient was called and made aware.

## 2024-04-30 ENCOUNTER — Ambulatory Visit: Admitting: Urology

## 2024-07-21 ENCOUNTER — Ambulatory Visit: Admitting: Nurse Practitioner
